# Patient Record
Sex: Female | Born: 1970
Health system: Southern US, Community
[De-identification: ages and names within clinical notes are randomized; demographics above are authoritative.]

## PROBLEM LIST (undated history)

## (undated) DIAGNOSIS — Z860100 Personal history of colon polyps, unspecified: Secondary | ICD-10-CM

## (undated) DIAGNOSIS — D279 Benign neoplasm of unspecified ovary: Secondary | ICD-10-CM

## (undated) DIAGNOSIS — E039 Hypothyroidism, unspecified: Secondary | ICD-10-CM

## (undated) DIAGNOSIS — K760 Fatty (change of) liver, not elsewhere classified: Secondary | ICD-10-CM

## (undated) DIAGNOSIS — F419 Anxiety disorder, unspecified: Secondary | ICD-10-CM

## (undated) DIAGNOSIS — E7800 Pure hypercholesterolemia, unspecified: Secondary | ICD-10-CM

## (undated) DIAGNOSIS — G4730 Sleep apnea, unspecified: Secondary | ICD-10-CM

## (undated) DIAGNOSIS — E079 Disorder of thyroid, unspecified: Secondary | ICD-10-CM

## (undated) DIAGNOSIS — F329 Major depressive disorder, single episode, unspecified: Secondary | ICD-10-CM

## (undated) DIAGNOSIS — I10 Essential (primary) hypertension: Secondary | ICD-10-CM

## (undated) DIAGNOSIS — E139 Other specified diabetes mellitus without complications: Secondary | ICD-10-CM

## (undated) DIAGNOSIS — E559 Vitamin D deficiency, unspecified: Secondary | ICD-10-CM

## (undated) DIAGNOSIS — F32A Depression, unspecified: Secondary | ICD-10-CM

## (undated) DIAGNOSIS — C4490 Unspecified malignant neoplasm of skin, unspecified: Secondary | ICD-10-CM

## (undated) HISTORY — DX: Personal history of colon polyps, unspecified: Z86.0100

## (undated) HISTORY — DX: Depression, unspecified: F32.A

## (undated) HISTORY — DX: Benign neoplasm of unspecified ovary: D27.9

## (undated) HISTORY — DX: Major depressive disorder, single episode, unspecified: F32.9

## (undated) HISTORY — DX: Unspecified malignant neoplasm of skin, unspecified: C44.90

## (undated) HISTORY — DX: Sleep apnea, unspecified: G47.30

## (undated) HISTORY — DX: Anxiety disorder, unspecified: F41.9

## (undated) HISTORY — DX: Other specified diabetes mellitus without complications: E13.9

## (undated) HISTORY — DX: Disorder of thyroid, unspecified: E07.9

## (undated) HISTORY — DX: Fatty (change of) liver, not elsewhere classified: K76.0

## (undated) HISTORY — DX: Vitamin D deficiency, unspecified: E55.9

## (undated) HISTORY — DX: Pure hypercholesterolemia, unspecified: E78.00

## (undated) HISTORY — DX: Essential (primary) hypertension: I10

## (undated) HISTORY — PX: OVARIAN CYST REMOVAL: SHX89

## (undated) HISTORY — DX: Personal history of colonic polyps: Z86.010

## (undated) HISTORY — DX: Hypothyroidism, unspecified: E03.9

---

## 1999-09-24 ENCOUNTER — Encounter: Payer: Self-pay | Admitting: Infectious Diseases

## 1999-09-24 ENCOUNTER — Ambulatory Visit (HOSPITAL_COMMUNITY): Admission: RE | Admit: 1999-09-24 | Discharge: 1999-09-24 | Payer: Self-pay | Admitting: Infectious Diseases

## 2002-08-25 ENCOUNTER — Other Ambulatory Visit: Admission: RE | Admit: 2002-08-25 | Discharge: 2002-08-25 | Payer: Self-pay | Admitting: Obstetrics and Gynecology

## 2002-11-16 ENCOUNTER — Ambulatory Visit (HOSPITAL_COMMUNITY): Admission: RE | Admit: 2002-11-16 | Discharge: 2002-11-16 | Payer: Self-pay | Admitting: *Deleted

## 2003-05-09 ENCOUNTER — Other Ambulatory Visit: Admission: RE | Admit: 2003-05-09 | Discharge: 2003-05-09 | Payer: Self-pay | Admitting: *Deleted

## 2006-01-07 ENCOUNTER — Ambulatory Visit (HOSPITAL_BASED_OUTPATIENT_CLINIC_OR_DEPARTMENT_OTHER): Admission: RE | Admit: 2006-01-07 | Discharge: 2006-01-07 | Payer: Self-pay | Admitting: Otolaryngology

## 2006-01-10 ENCOUNTER — Ambulatory Visit: Payer: Self-pay | Admitting: Internal Medicine

## 2007-02-10 ENCOUNTER — Encounter: Admission: RE | Admit: 2007-02-10 | Discharge: 2007-02-10 | Payer: Self-pay | Admitting: Occupational Medicine

## 2007-12-08 ENCOUNTER — Ambulatory Visit (HOSPITAL_COMMUNITY): Admission: RE | Admit: 2007-12-08 | Discharge: 2007-12-08 | Payer: Self-pay | Admitting: *Deleted

## 2007-12-08 ENCOUNTER — Encounter (INDEPENDENT_AMBULATORY_CARE_PROVIDER_SITE_OTHER): Payer: Self-pay | Admitting: *Deleted

## 2010-08-03 HISTORY — PX: ABDOMINAL HYSTERECTOMY: SHX81

## 2010-12-09 ENCOUNTER — Other Ambulatory Visit: Payer: Self-pay | Admitting: Family Medicine

## 2010-12-12 ENCOUNTER — Ambulatory Visit
Admission: RE | Admit: 2010-12-12 | Discharge: 2010-12-12 | Disposition: A | Discharge status: Home / Self Care | Payer: 59 | Source: Ambulatory Visit | Attending: Family Medicine | Admitting: Family Medicine

## 2010-12-12 ENCOUNTER — Ambulatory Visit
Admission: RE | Admit: 2010-12-12 | Discharge: 2010-12-12 | Disposition: A | Discharge status: Home / Self Care | Payer: No Typology Code available for payment source | Source: Ambulatory Visit | Attending: Family Medicine | Admitting: Family Medicine

## 2010-12-12 ENCOUNTER — Other Ambulatory Visit: Payer: Self-pay | Admitting: Family Medicine

## 2010-12-12 DIAGNOSIS — R1900 Intra-abdominal and pelvic swelling, mass and lump, unspecified site: Secondary | ICD-10-CM

## 2010-12-12 MED ORDER — IOHEXOL 300 MG/ML  SOLN
125.0000 mL | Freq: Once | INTRAMUSCULAR | Status: AC | PRN
  Administered 2010-12-12: 125 mL via INTRAVENOUS

## 2010-12-16 NOTE — Op Note (Signed)
NAMESHADOE, CRYAN              ACCOUNT NO.:  [id number]   MEDICAL RECORD NO.:  [id number]          PATIENT TYPE:  AMB   LOCATION:  SDC                           FACILITY:  [facility]   PHYSICIAN:  [place] B. Earlene Plater, M.D.  DATE OF BIRTH:  17-Feb-1971   DATE OF PROCEDURE:  12/08/2007  DATE OF DISCHARGE:                               OPERATIVE REPORT   PREOPERATIVE DIAGNOSIS:  Endometrial polyp.   POSTOPERATIVE DIAGNOSIS:  Endometrial polyp.   PROCEDURE:  Hysteroscopy, dilation and curettage, and polypectomy.   SURGEON:  Chester Holstein. Earlene Plater, M.D.   ASSISTANT:  None.   ANESTHESIA:  LMA general.   FINDINGS:  Endometrial polyp and proliferative endometrium, otherwise  normal-appearing cavity.   SPECIMENS:  Endometrial polyp and curettings to pathology.   BLOOD LOSS:  Minimal.   COMPLICATIONS:  None.   FLUID DEFICIT:  50 mL.   INDICATIONS:  The patient had ultrasound in the office initially to  evaluate for pain, was noted to have an ovarian cyst, which resolved on  followup; however, was noted to have focal endometrial abnormality  consistent with polyp.  Sonohystogram subsequently confirmed these  findings.  The patient is interested in conception; therefore, I  recommended evaluation of the cavity and removal of polyp if identified.  The patient was advised of the risks of surgery including infection,  bleeding, perforation, and damage to surrounding organs.   The patient was taken to the operating room and LMA general anesthesia  obtained.  Prepped and draped in standard fashion.  Bladder drained with  an in-and-out catheter.  Exam under anesthesia showed a mid-plane normal  sized uterus, no adnexal masses.   Speculum inserted, paracervical block placed.  Cervix grasped with a  single-tooth tenaculum and cervix dilated to #21 without difficulty.  The diagnostic hysteroscope was inserted after being flushed.  Good  uterine distention obtained, above findings noted.  Polyp was  removed  with Randall stone forceps.  Endometrium gently curetted.  Scope  returned, no other abnormalities seen, and the procedure was terminated.   The instruments were removed.  Cervix was hemostatic.  The patient  tolerated the procedure well with no complications.  She was taken to  recovery room awake, alert, and in stable condition.      Gerri Spore B. Earlene Plater, M.D.  Electronically Signed     WBD/MEDQ  D:  12/08/2007  T:  12/08/2007  Job:  [id number]

## 2010-12-19 NOTE — H&P (Signed)
   [name], [name]                        ACCOUNT NO.:  [id number]   MEDICAL RECORD NO.:  [id number]                   PATIENT TYPE:  AMB   LOCATION:  SDC                                  [facility]  [facility]   PHYSICIAN:  [place] B. Earlene Plater, M.D.               DATE OF BIRTH:  1971-07-06   DATE OF ADMISSION:  DATE OF DISCHARGE:                                HISTORY & PHYSICAL   PREOPERATIVE DIAGNOSIS:  Severe cervical dysplasia.   INTENDED PROCEDURE:  Laser vaporization of cervical dysplasia.   HISTORY OF PRESENT ILLNESS:  A 40 year old, white female, gravida 0, history  of low-grade Pap smears.  Subsequently biopsies showed large area of cervix  involving CIN 2 and 3.  Colposcopic impression was moderate dysplasia, and  the area is extensive, involving the cervix.  I have therefore recommended  patient undergo laser vaporization as opposed to a LEEP to try to reduce  cervical trauma but effect adequate treatment.   PAST MEDICAL HISTORY:  None.   PAST SURGICAL HISTORY:  A urethral dilation.   MEDICATIONS:  None.   ALLERGIES:  None.   SOCIAL HISTORY:  No alcohol or tobacco or drugs.   FAMILY HISTORY:  Heart disease, hypertension.   REVIEW OF SYSTEMS:  Otherwise noncontributory.   PHYSICAL EXAMINATION:  VITAL SIGNS:  Blood pressure 120/80, pulse 80, weight  189.  GENERAL:  Alert and oriented, in no acute distress.  SKIN:  Warm and dry, no lesions.  HEART:  Regular rate and rhythm.  LUNGS:  Clear to auscultation.  PELVIC:  Normal external genitalia.  Vagina on colposcopy shows large area  of acetowhite changes along the cervix circumferentially around the cervix  but more extensively involving the left lower quadrant.   ASSESSMENT:  Severe cervical dysplasia.   PLAN:  Laser vaporization with CO2.  Operative risks discussed of infection  and bleeding, risk of cervical weakening that could impact subsequent  pregnancies.  All questions answered.  Patient wished to  proceed.                                               Gerri Spore B. Earlene Plater, M.D.   WBD/MEDQ  D:  11/15/2002  T:  11/15/2002  Job:  [id number]

## 2010-12-19 NOTE — Op Note (Signed)
NAMEKWANA, RINGEL                        ACCOUNT NO.:  [id number]   MEDICAL RECORD NO.:  [id number]                   PATIENT TYPE:  AMB   LOCATION:  SDC                                  [facility]   PHYSICIAN:  [place] B. Earlene Plater, M.D.               DATE OF BIRTH:  1971/02/19   DATE OF PROCEDURE:  11/16/2002  DATE OF DISCHARGE:                                 OPERATIVE REPORT   PREOPERATIVE DIAGNOSES:  CIN II and III of ectocervix.   POSTOPERATIVE DIAGNOSES:  CIN II and III of ectocervix.   PROCEDURE:  Laser vaporization with CO2 of cervical dysplasia.   SURGEON:  Chester Holstein. Earlene Plater, M.D.   ANESTHESIA:  LMA general and 1% lidocaine with epinephrine paracervical  block.   FINDINGS:  Acetowhite changes diffusely on the ectocervix and transition  zone.   ESTIMATED BLOOD LOSS:  Less than 25 mL.   COMPLICATIONS:  None.   SPECIMENS:  None.   INDICATIONS:  The patient with biopsy proven CIN II and CIN III which was  diffusely involving the ectocervix and transition zone.  Given the extensive  nature of the lesion, recommended CO2 as opposed to a LEEP to attempt to  reduce cervical trauma.   PROCEDURE:  The patient was taken to the operating room and LMA general  anesthesia obtained.  She was placed in the ski position and draped with  moistened surgical towels.  Speculum inserted.  4% acetic acid applied to  the cervix.  Colposcopy performed.  Previously noted area of dense  acetowhite changes seen, again most dominantly in the left lower quadrant of  the cervix; however, the majority of the ectocervix and exterior portion of  transition zone were affected.  Paracervical block was placed in a standard  technique.  CO2 laser attached to the colposcope and set on 15 watts  continuous.  The area of abnormality in about a 2 mm margin was outlined  with laser and the area inside this ablated with the CO2 laser.  Depth of 3  mm was obtained.  The laser was then diffused and  placed at 10 watts and the  bleeding areas retouched for hemostasis.  Subsequently, a piece of Gelfoam  soaked on Monsel's was placed over the laser bed.  The patient tolerated procedure well.  There were no complications.   DISPOSITION:  She was taken to recovery room awake, alert, in stable  condition.  She will return to the office in a month and will plan to repeat  cytology in four to six months.                                               Gerri Spore B. Earlene Plater, M.D.   WBD/MEDQ  D:  11/16/2002  T:  11/16/2002  Job:  [id number]

## 2010-12-19 NOTE — Procedures (Signed)
NAMEBRITTON, PERKINSON              ACCOUNT NO.:  [id number]   MEDICAL RECORD NO.:  [id number]          PATIENT TYPE:  OUT   LOCATION:  SLEEP CENTER                 FACILITY:  [facility]   PHYSICIAN:  [place] D. Maple Hudson, M.D. DATE OF BIRTH:  08/21/1970   DATE OF STUDY:                              NOCTURNAL POLYSOMNOGRAM   REFERRING PHYSICIAN:  Dr. Christia Reading.   INDICATIONS FOR STUDY:  Hypersomnia with sleep apnea.   EPWORTH SLEEPINESS SCORE:  12/24.   BMI:  38.3.   WEIGHT:  225 pounds.   No medications listed.   SLEEP ARCHITECTURE:  Total sleep time 389 minutes with sleep efficiency 91%.  Stage I was 4%, stage II 54%, stages III and IV 19%, REM 23% of total sleep  time.  Sleep latency 15 minutes, REM latency 184 minutes, awake after sleep  onset 24 minutes, arousal index 14.8.  No bedtime medication taken.   RESPIRATORY DATA:  Split study protocol.  Apnea/hypopnea index (AHI, RDI)  52.9 obstructive events per hour indicating severe obstructive sleep  apnea/hypopnea syndrome before CPAP.  This included 18 obstructive apneas  and 105 hypopneas before CPAP.  Events were more common while supine and  most sleep was while supine.  REM AHI 4.6 per hour.  CPAP was titrated to 12  CWP, AHI 2.4 per hour.  A medium ResMed Mirage Swift II was used with heated  humidifier.   OXYGEN DATA:  Moderate snoring with oxygen desaturation to a nadir of 74%  before CPAP.  After sleep at control saturation held 94-95% on room air.   CARDIAC DATA:  Normal sinus rhythm.   MOVEMENT/PARASOMNIA:  Insignificant leg jerks. bathroom times one.   IMPRESSION/RECOMMENDATIONS:  1.  Severe obstructive sleep apnea/hypopnea syndrome, AHI 52.9 per hour with      events more common while supine.  Moderate snoring with oxygen      desaturation to a nadir of 74%.  2.  Successful CPAP titration to 12 CWP, AHI 2.4 per hour.  A ResMed Mirage      Swift II was used with medium nasal pillows and heated  humidifier.      Clinton D. Maple Hudson, M.D.  Diplomate, Biomedical engineer of Sleep Medicine  Electronically Signed     CDY/MEDQ  D:  01/10/2006 09:24:20  T:  01/11/2006 07:34:33  Job:  [id number]

## 2011-01-16 ENCOUNTER — Ambulatory Visit: Payer: 59 | Admitting: Gynecology

## 2011-01-20 ENCOUNTER — Ambulatory Visit: Payer: 59 | Attending: Gynecology | Admitting: Gynecology

## 2011-01-20 DIAGNOSIS — Z9079 Acquired absence of other genital organ(s): Secondary | ICD-10-CM | POA: Insufficient documentation

## 2011-01-20 DIAGNOSIS — Z90710 Acquired absence of both cervix and uterus: Secondary | ICD-10-CM | POA: Insufficient documentation

## 2011-01-20 DIAGNOSIS — Z09 Encounter for follow-up examination after completed treatment for conditions other than malignant neoplasm: Secondary | ICD-10-CM | POA: Insufficient documentation

## 2011-01-22 NOTE — Consult Note (Signed)
  NAMEVELORA, [name]              ACCOUNT NO.:  [id number]  MEDICAL RECORD NO.:  [id number]  LOCATION:  GYN                          FACILITY:  [facility]  PHYSICIAN:  De Blanch, M.D.DATE OF BIRTH:  07-24-1971  DATE OF CONSULTATION:  01/20/2011 DATE OF DISCHARGE:                                CONSULTATION   CHIEF COMPLAINT:  Postoperative followup.  INTERVAL HISTORY:  The patient returns today now 1 week following right salpingo-oophorectomy and abdominal hysterectomy.  She has had an uncomplicated postoperative course.  She is eating well and has had a recent bowel movement.  She still has some pain in her lower abdomen, which is not surprising.  She denies any fever or chills and denies any vaginal bleeding.  I have reviewed the patient's pathology report with her today.  The ovarian cyst, which is 26 cm in diameter and contained 7 liters of clear fluid was a serous cystadenoma.  The uterus had an incidental endometrial polyp with no other abnormalities.  PHYSICAL EXAMINATION:  VITAL SIGNS:  Weight 231 pounds, blood pressure 128/68, pulse 70. GENERAL:  The patient is a healthy white female in no acute distress. ABDOMEN:  Soft, nontender.  Midline incision is healing well.  No mass, organomegaly, ascites, or hernias are noted.  The skin staples were removed and Steri-Strips were applied.  The patient continued to recover and has her postoperative instructions. She will return to see [place] for a 6-week postoperative check and at that juncture release her back to her primary gynecologist, Dr. Cherly Hensen.     De Blanch, M.D.     DC/MEDQ  D:  01/20/2011  T:  01/21/2011  Job:  [id number]  cc:   Maxie Better, M.D. Fax: [phone]  Telford Nab, R.N. [address] [place], [place] [address]  Electronically Signed by De Blanch M.D. on 01/22/2011 11:23:38 AM

## 2011-02-01 DIAGNOSIS — D279 Benign neoplasm of unspecified ovary: Secondary | ICD-10-CM

## 2011-02-01 HISTORY — DX: Benign neoplasm of unspecified ovary: D27.9

## 2011-07-07 ENCOUNTER — Other Ambulatory Visit: Payer: Self-pay | Admitting: Obstetrics and Gynecology

## 2011-07-07 ENCOUNTER — Other Ambulatory Visit: Payer: Self-pay | Admitting: Family Medicine

## 2011-07-07 DIAGNOSIS — Z1231 Encounter for screening mammogram for malignant neoplasm of breast: Secondary | ICD-10-CM

## 2011-08-05 ENCOUNTER — Ambulatory Visit
Admission: RE | Admit: 2011-08-05 | Discharge: 2011-08-05 | Disposition: A | Discharge status: Home / Self Care | Payer: BC Managed Care – PPO | Source: Ambulatory Visit | Attending: Family Medicine | Admitting: Family Medicine

## 2011-08-05 DIAGNOSIS — Z1231 Encounter for screening mammogram for malignant neoplasm of breast: Secondary | ICD-10-CM

## 2011-09-27 ENCOUNTER — Ambulatory Visit (INDEPENDENT_AMBULATORY_CARE_PROVIDER_SITE_OTHER): Payer: BC Managed Care – PPO | Admitting: Family Medicine

## 2011-09-27 DIAGNOSIS — R413 Other amnesia: Secondary | ICD-10-CM

## 2011-09-27 DIAGNOSIS — R6889 Other general symptoms and signs: Secondary | ICD-10-CM

## 2011-09-27 DIAGNOSIS — L989 Disorder of the skin and subcutaneous tissue, unspecified: Secondary | ICD-10-CM

## 2011-09-27 DIAGNOSIS — D2370 Other benign neoplasm of skin of unspecified lower limb, including hip: Secondary | ICD-10-CM

## 2011-09-27 DIAGNOSIS — E039 Hypothyroidism, unspecified: Secondary | ICD-10-CM | POA: Insufficient documentation

## 2011-09-27 LAB — POCT CBC
Granulocyte percent: 60.1 %G (ref 37–80)
HCT, POC: 38.5 % (ref 37.7–47.9)
Hemoglobin: 12.5 g/dL (ref 12.2–16.2)
Lymph, poc: 2.6 (ref 0.6–3.4)
MCH, POC: 29.8 pg (ref 27–31.2)
MCHC: 32.5 g/dL (ref 31.8–35.4)
MCV: 91.9 fL (ref 80–97)
MID (cbc): 0.4 (ref 0–0.9)
MPV: 7.7 fL (ref 0–99.8)
POC Granulocyte: 4.5 (ref 2–6.9)
POC LYMPH PERCENT: 34.9 %L (ref 10–50)
POC MID %: 5 %M (ref 0–12)
Platelet Count, POC: 343 10*3/uL (ref 142–424)
RBC: 4.19 M/uL (ref 4.04–5.48)
RDW, POC: 13.4 %
WBC: 7.5 10*3/uL (ref 4.6–10.2)

## 2011-09-27 LAB — COMPREHENSIVE METABOLIC PANEL
ALT: 14 U/L (ref 0–35)
AST: 14 U/L (ref 0–37)
Albumin: 4.4 g/dL (ref 3.5–5.2)
Alkaline Phosphatase: 65 U/L (ref 39–117)
BUN: 12 mg/dL (ref 6–23)
CO2: 26 mEq/L (ref 19–32)
Calcium: 9.7 mg/dL (ref 8.4–10.5)
Chloride: 104 mEq/L (ref 96–112)
Creat: 0.65 mg/dL (ref 0.50–1.10)
Glucose, Bld: 77 mg/dL (ref 70–99)
Potassium: 4 mEq/L (ref 3.5–5.3)
Sodium: 139 mEq/L (ref 135–145)
Total Bilirubin: 0.5 mg/dL (ref 0.3–1.2)
Total Protein: 7.3 g/dL (ref 6.0–8.3)

## 2011-09-27 LAB — TSH: TSH: 5.046 u[IU]/mL — ABNORMAL HIGH (ref 0.350–4.500)

## 2011-09-27 NOTE — Progress Notes (Signed)
Patient Name: [name] Date of Birth: [birth year] Medical Record Number: [id number] Gender: female Date of Encounter: 09/27/2011  History of Present Illness:  [name] is a 41 y.o. very pleasant female patient who presents with the following:  Needs a TSH check- last done July 2012. Had a partial hyst last summer to remove a very large but benign ovarian cyst- it weighed 17 lbs! She feels so much better.  She has one ovary remaining.   However she is concerned about a loss of memory over the last several months since she had her surgery.  Has some forgetfulness at work.  Her partner has noticed this the most- she just doesn't seem to have quite her normal memory.  Has not gotten lost in familiar surroundings, feels that her mood is ok.  Sleeping well.  Fhx: a great aunt had alzheimer's in her 21s but otherwise negative.    Also noticed a mole in her left hip the last few days- it may have been there longer but she is not sure.  Does not hurt, itch or bleed Patient Active Problem List  Diagnoses  . Hypothyroidism   Past Medical History  Diagnosis Date  . Benign ovarian tumor July 2012    17 pound benign tumor- removed   Past Surgical History  Procedure Date  . Abdominal hysterectomy 2012    one ovary remains- due to giant benign ovarian cyst (17 lbs)   History  Substance Use Topics  . Smoking status: Never Smoker   . Smokeless tobacco: Never Used  . Alcohol Use: Yes     socially   Family History  Problem Relation Age of Onset  . Heart disease Father   . Heart disease Paternal Grandfather    Allergies  Allergen Reactions  . Synthroid Anxiety and Palpitations    Medication list has been reviewed and updated.  Review of Systems: As per HPI- otherwise negative  Physical Examination: Filed Vitals:   09/27/11 0916  BP: 112/76  Pulse: 61  Temp: 98.4 F (36.9 C)  TempSrc: Oral  Resp: 16  Height: 5' 4.5" (1.638 m)  Weight: 234 lb (106.142 kg)     Body mass index is 39.55 kg/(m^2).  GEN: WDWN, NAD, Non-toxic, A & O x 3 HEENT: Atraumatic, Normocephalic. Neck supple. No masses, No LAD. Ears and Nose: No external deformity. CV: RRR, No M/G/R. No JVD. No thrill. No extra heart sounds. PULM: CTA B, no wheezes, crackles, rhonchi. No retractions. No resp. distress. No accessory muscle use. ABD: S, NT, ND, +BS. No rebound. No HSM. EXTR: No c/c/e NEURO Normal gait.  PSYCH: Normally interactive. Conversant. Not depressed or anxious appearing.  Calm demeanor.  Likely seborrheic keratosis, about 5mm in diameter on her left leg   Verbal consent obtained.  Used sterile technique for procedure.  Anest with 1% lidocaine, 2ml.  Used dermablade to remove skin lesion and sent to pathology.  Dressed with gauze and a band- aid . No complications, minimal blood loss.    Assessment and Plan: 1. Hypothyroidism  TSH  2. Memory loss  POCT CBC, Comprehensive metabolic panel, TSH  3. Skin lesion of left leg  Dermatology pathology   Patient is concerned about memory loss.  She has a difficult time describing any specific instances of forgetting, but just feels that her memory is not as good as usual.  Her partner has noticed the same. I will check labs as above, asked her and her partner to independently keep a  journal regarding episodes of memory loss.  If they continue to be concerned about a pattern will send for further evaluation.    Expect pathology to show a seborrheic keratosis

## 2011-09-28 ENCOUNTER — Encounter: Payer: Self-pay | Admitting: Family Medicine

## 2011-10-01 ENCOUNTER — Telehealth: Payer: Self-pay

## 2011-10-01 MED ORDER — THYROID 15 MG PO TABS: 45.0000 mg | ORAL_TABLET | Freq: Every day | ORAL | Status: DC

## 2011-10-01 NOTE — Telephone Encounter (Signed)
.  [facility] PT WANTED DR COPLAND TO KNOW THAT THE LETTER SHE SENT HER IS FINE WITH THE 15MG  OF THE THYROID MEDICINE PLEASE CALL [phone]   [facility] IN [address]

## 2011-10-01 NOTE — Telephone Encounter (Signed)
Sent in a Rx. I sent in #90 so when she ran out of her 30mg  tablets she could take 3 of the 15mg  and only have 1 copay.

## 2011-10-02 NOTE — Telephone Encounter (Signed)
GAVE PT MESSAGE

## 2012-01-27 ENCOUNTER — Other Ambulatory Visit: Payer: Self-pay | Admitting: Physician Assistant

## 2012-03-20 ENCOUNTER — Other Ambulatory Visit: Payer: Self-pay | Admitting: Physician Assistant

## 2012-04-28 ENCOUNTER — Other Ambulatory Visit: Payer: Self-pay | Admitting: Physician Assistant

## 2012-05-17 ENCOUNTER — Ambulatory Visit (INDEPENDENT_AMBULATORY_CARE_PROVIDER_SITE_OTHER): Payer: 59 | Admitting: Family Medicine

## 2012-05-17 VITALS — BP 138/92 | HR 90 | Resp 18 | Ht 64.0 in | Wt 245.0 lb

## 2012-05-17 DIAGNOSIS — L709 Acne, unspecified: Secondary | ICD-10-CM

## 2012-05-17 DIAGNOSIS — L570 Actinic keratosis: Secondary | ICD-10-CM

## 2012-05-17 DIAGNOSIS — E039 Hypothyroidism, unspecified: Secondary | ICD-10-CM

## 2012-05-17 DIAGNOSIS — L708 Other acne: Secondary | ICD-10-CM

## 2012-05-17 MED ORDER — CLINDAMYCIN PHOS-BENZOYL PEROX 1.2-5 % EX GEL: 1.0000 "application " | Freq: Two times a day (BID) | CUTANEOUS | Status: DC

## 2012-05-17 NOTE — Progress Notes (Signed)
  Subjective:    Patient ID: Newman Pies, female    DOB: 05-Aug-1970, 41 y.o.   MRN: [id number] Chief Complaint  Patient presents with  . Hypothyroidism    thyroid meds renewed  . Other    has bump tip of nose    HPI  Fatigued no hair, skin changes, bowels, mood ok, sleep ok.     Review of Systems    BP 138/92  Pulse 90  Resp 18  Ht 5\' 4"  (1.626 m)  Wt 245 lb (111.131 kg)  BMI 42.03 kg/m2  SpO2 98%  Objective:   Physical Exam        Assessment & Plan:  Hypothyroidism - Plan: TSH  Acne - Plan: Clindamycin-Benzoyl Per, Refr, gel, Ambulatory referral to Dermatology  Actinic keratosis of multiple sites of head and neck - Plan: Clindamycin-Benzoyl Per, Refr, gel, Ambulatory referral to Dermatology  Meds ordered this encounter  Medications  . Clindamycin-Benzoyl Per, Refr, gel    Sig: Apply 1 application topically 2 (two) times daily.    Dispense:  45 g    Refill:  1

## 2012-05-17 NOTE — Patient Instructions (Addendum)
Look for a face wash with some salicylic acid in it to use once to twice a day to keep your pores clear and prevent these whiteheads.  Actinic Keratosis Actinic keratosis is a precancerous growth on the skin. This means it could develop into skin cancer if it is not treated. About 1% of actinic keratoses turn into skin cancer within a year. It is important to have all such growths removed to prevent them from developing into skin cancer. CAUSES  Actinic keratosis is caused by getting too much ultraviolet (UV) radiation from the sun or other UV light sources. RISK FACTORS Factors that increase your chances of getting actinic keratosis include:  Having light-colored skin and blue eyes.  Having blonde or red hair.  Spending a lot of time in the sun.  Age. The risk of actinic keratosis increases with age. SYMPTOMS  Actinic keratosis growths look like scaly, rough spots of skin. They can be as small as a pinhead or as big as a quarter. They may itch, hurt, or feel sensitive. Sometimes there is a little tag of pink or gray skin growing off them. In some cases, actinic keratoses are easier felt than seen. They do not go away with the use of moisturizing lotions or creams. Actinic keratoses appear most often on areas of skin that get a lot of sun exposure. These areas include the:  Scalp.  Face.  Ears.  Lips.  Upper back.  Backs of the hands.  Forearms. DIAGNOSIS  Your caregiver can usually tell what is wrong by performing a physical exam. A tissue sample (biopsy) may also be taken and examined under a microscope. TREATMENT  Actinic keratosis can be treated several ways. Most treatments can be done in your caregiver's office. Treatment options may include:  Curettage. A tool is used to gently scrape off the growth.  Cryosurgery. Liquid nitrogen is applied to the growth to freeze it. The growth eventually falls off the skin.  Medicated creams, such as 5-fluorouracil or imiquimod. The  medicine destroys the cells in the growth.  Chemical peels. Chemicals are applied to the growth and the outer layers of skin are peeled off.  Photodynamic therapy. A drug that makes your skin more sensitive to light is applied to the skin. A strong, blue light is aimed at the skin and destroys the growth. PREVENTION  To prevent future sun damage:  Try to avoid the sun between 10:00 a.m. and 4:00 p.m. when it is the strongest.  Use a sunscreen or sunblock with SPF 30 or greater.  Apply sunscreen at least 30 minutes before exposure to the sun.  Always wear protective hats, clothing, and sunglasses with UV protection.  Avoid medicines, herbs, and foods that increase your sensitivity to sunlight.  Avoid tanning beds. HOME CARE INSTRUCTIONS   If your skin was covered with a bandage, change and remove the bandage as directed by your caregiver.  Keep the treated area dry as directed by your caregiver.  Apply any creams as prescribed by your caregiver. Follow the directions carefully.  Check your skin regularly for any changes.  Visit a skin doctor (dermatologist) every year for a skin exam. SEEK MEDICAL CARE IF:   Your skin does not heal and becomes irritated, red, or bleeds.  You notice any changes or new growths on your skin. Document Released: 10/16/2008 Document Revised: 10/12/2011 Document Reviewed: 08/31/2011 [facility] Patient Information 2013 [place], [place].

## 2012-05-18 LAB — TSH: TSH: 3.94 u[IU]/mL (ref 0.350–4.500)

## 2012-05-23 ENCOUNTER — Other Ambulatory Visit: Payer: Self-pay | Admitting: Physician Assistant

## 2012-06-29 ENCOUNTER — Other Ambulatory Visit: Payer: Self-pay | Admitting: Family Medicine

## 2012-06-29 DIAGNOSIS — Z1231 Encounter for screening mammogram for malignant neoplasm of breast: Secondary | ICD-10-CM

## 2012-07-05 ENCOUNTER — Other Ambulatory Visit: Payer: Self-pay | Admitting: Surgery

## 2012-08-05 ENCOUNTER — Ambulatory Visit
Admission: RE | Admit: 2012-08-05 | Discharge: 2012-08-05 | Disposition: A | Discharge status: Home / Self Care | Payer: Self-pay | Source: Ambulatory Visit | Attending: Family Medicine | Admitting: Family Medicine

## 2012-08-05 DIAGNOSIS — Z1231 Encounter for screening mammogram for malignant neoplasm of breast: Secondary | ICD-10-CM

## 2013-01-16 ENCOUNTER — Ambulatory Visit (INDEPENDENT_AMBULATORY_CARE_PROVIDER_SITE_OTHER): Payer: 59 | Admitting: Physician Assistant

## 2013-01-16 VITALS — BP 131/88 | HR 97 | Temp 98.9°F | Resp 16 | Ht 64.5 in | Wt 249.0 lb

## 2013-01-16 DIAGNOSIS — E669 Obesity, unspecified: Secondary | ICD-10-CM

## 2013-01-16 DIAGNOSIS — E039 Hypothyroidism, unspecified: Secondary | ICD-10-CM

## 2013-01-16 DIAGNOSIS — J329 Chronic sinusitis, unspecified: Secondary | ICD-10-CM

## 2013-01-16 DIAGNOSIS — R059 Cough, unspecified: Secondary | ICD-10-CM

## 2013-01-16 DIAGNOSIS — R05 Cough: Secondary | ICD-10-CM

## 2013-01-16 LAB — GLUCOSE, POCT (MANUAL RESULT ENTRY): POC Glucose: 94 mg/dl (ref 70–99)

## 2013-01-16 LAB — TSH: TSH: 6.024 u[IU]/mL — ABNORMAL HIGH (ref 0.350–4.500)

## 2013-01-16 MED ORDER — HYDROCOD POLST-CHLORPHEN POLST 10-8 MG/5ML PO LQCR: 5.0000 mL | Freq: Two times a day (BID) | ORAL | Status: DC | PRN

## 2013-01-16 MED ORDER — IPRATROPIUM BROMIDE 0.03 % NA SOLN: 2.0000 | Freq: Two times a day (BID) | NASAL | Status: DC

## 2013-01-16 MED ORDER — AMOXICILLIN-POT CLAVULANATE 875-125 MG PO TABS: 1.0000 | ORAL_TABLET | Freq: Two times a day (BID) | ORAL | Status: DC

## 2013-01-16 NOTE — Progress Notes (Signed)
  Subjective:    Patient ID: [name], female    DOB: 1970-10-18, 42 y.o.   MRN: [id number]  HPI  Presents with "head and chest cold" x 10 days. Headache, nasal congestion, sore throat, drainage, and productive cough. Cough keeps patient awake at night and is green/yellow in color. Headache is described as pressure in a band-like pattern around entire head. Has tried Mucinex, Tussin, and Dayquil without improvement. Pt also describes a yellow crust on her eyelashes in the morning which prevent her from opening her eye. Denies nausea, vomiting, fever. Denies sinus tenderness.   Hx of hypothyroidism.  Review of Systems Denies: vision changes, SOB, chest pain, swelling in extremities, numbness/tingling in hands/feet.     Objective:   Physical Exam  BP 131/88  Pulse 97  Temp(Src) 98.9 F (37.2 C)  Resp 16  Ht 5' 4.5" (1.638 m)  Wt 249 lb (112.946 kg)  BMI 42.1 kg/m2  SpO2 97%  General: WDWN female appears stated age in no acute distress Head: normocephalic, atraumatic  Eyes: PERLA   Ears: TMs are clear with visible bony landmarks   Nose: turbinates normal, mild amounts of clear discharge visible  Throat: uvula midline, posterior pharynx is mildly erythematous, tonsils with mild erythema without exudate   Neck: supple, no lymphadenopathy or tenderness Resp: clear to auscultation bilaterally, without rales/rhochi/wheezes Cardiac: RRR, no murmurs/rubs/gallops  Extremities: moves all limbs spontaneously  Neuro: alert and oriented x 3 Skin: no rashes, lesions   Results for orders placed in visit on 01/16/13  GLUCOSE, POCT (MANUAL RESULT ENTRY)      Result Value Range   POC Glucose 94  70 - 99 mg/dl       Assessment & Plan:   Cough - Plan: chlorpheniramine-HYDROcodone (TUSSIONEX PENNKINETIC ER) 10-8 MG/5ML LQCR  Sinusitis - Plan: amoxicillin-clavulanate (AUGMENTIN) 875-125 MG per tablet, ipratropium (ATROVENT) 0.03 % nasal spray  Hypothyroidism - Plan: TSH  Obesity  - Plan: POCT glucose (manual entry)  Suspect sinus infection is causing productive cough.   TSH to monitor hypothyroidism (last measurement was 8 months ago).  Tussionex for cough.  Augmentin for sinus infection. Atrovent for nasal congestion.  May continue Mucinex OTC.

## 2013-01-16 NOTE — Progress Notes (Signed)
I have examined this patient along with the student and agree. Given that the last TSH was drawn 8 months ago, we'll update that here today.  Additionally, she requests screening for DM.  Her weight has fluctuated a lot (she's obese) and she has seen a number of direct-to-consumer education about pre-diabetes.

## 2013-01-16 NOTE — Patient Instructions (Addendum)
Take medications as directed.  Cough medication may make you drowsy, if you take it during the day please be careful at work and driving.  Finish the entire course of the antibiotic, even if your symptoms resolve sooner. Use warm compresses over eyelid and wash daily with warm water and a mild soap.  You may continue to take Mucinex.   Drink at least 64 fl oz each day to stay hydrated.  Get plenty of rest.  If your symptoms do not improve, or worse, please return to clinic.   When your lab results are ready, we will contact you.

## 2013-03-15 ENCOUNTER — Ambulatory Visit: Payer: BC Managed Care – PPO

## 2013-03-15 ENCOUNTER — Ambulatory Visit (INDEPENDENT_AMBULATORY_CARE_PROVIDER_SITE_OTHER): Payer: BC Managed Care – PPO | Admitting: Family Medicine

## 2013-03-15 VITALS — BP 124/76 | HR 77 | Temp 98.8°F | Resp 18 | Ht 64.5 in | Wt 252.0 lb

## 2013-03-15 DIAGNOSIS — R200 Anesthesia of skin: Secondary | ICD-10-CM

## 2013-03-15 DIAGNOSIS — R10816 Epigastric abdominal tenderness: Secondary | ICD-10-CM

## 2013-03-15 DIAGNOSIS — R209 Unspecified disturbances of skin sensation: Secondary | ICD-10-CM

## 2013-03-15 DIAGNOSIS — E039 Hypothyroidism, unspecified: Secondary | ICD-10-CM

## 2013-03-15 DIAGNOSIS — R202 Paresthesia of skin: Secondary | ICD-10-CM

## 2013-03-15 DIAGNOSIS — G8929 Other chronic pain: Secondary | ICD-10-CM

## 2013-03-15 LAB — POCT CBC
Granulocyte percent: 63.3 %G (ref 37–80)
HCT, POC: 41.2 % (ref 37.7–47.9)
Hemoglobin: 13 g/dL (ref 12.2–16.2)
Lymph, poc: 3 (ref 0.6–3.4)
MCH, POC: 30.7 pg (ref 27–31.2)
MCHC: 31.6 g/dL — AB (ref 31.8–35.4)
MCV: 97.4 fL — AB (ref 80–97)
MID (cbc): 0.5 (ref 0–0.9)
MPV: 7.5 fL (ref 0–99.8)
POC Granulocyte: 6.1 (ref 2–6.9)
POC LYMPH PERCENT: 31.2 %L (ref 10–50)
POC MID %: 5.5 %M (ref 0–12)
Platelet Count, POC: 375 10*3/uL (ref 142–424)
RBC: 4.23 M/uL (ref 4.04–5.48)
RDW, POC: 12.3 %
WBC: 9.6 10*3/uL (ref 4.6–10.2)

## 2013-03-15 LAB — COMPREHENSIVE METABOLIC PANEL
ALT: 17 U/L (ref 0–35)
AST: 14 U/L (ref 0–37)
Albumin: 4.3 g/dL (ref 3.5–5.2)
Alkaline Phosphatase: 69 U/L (ref 39–117)
BUN: 10 mg/dL (ref 6–23)
CO2: 25 mEq/L (ref 19–32)
Calcium: 9.6 mg/dL (ref 8.4–10.5)
Chloride: 102 mEq/L (ref 96–112)
Creat: 0.61 mg/dL (ref 0.50–1.10)
Glucose, Bld: 76 mg/dL (ref 70–99)
Potassium: 4.1 mEq/L (ref 3.5–5.3)
Sodium: 136 mEq/L (ref 135–145)
Total Bilirubin: 0.4 mg/dL (ref 0.3–1.2)
Total Protein: 7.6 g/dL (ref 6.0–8.3)

## 2013-03-15 NOTE — Patient Instructions (Addendum)
We will be in touch regarding your labs and abdominal ultrasound. If any change in your symptoms or other concerns do not hesitate to get in touch

## 2013-03-15 NOTE — Progress Notes (Addendum)
[facility] [address], [place] [address] [phone]  Date:  03/15/2013   Name:  [name]   DOB:  30-Sep-1970   MRN:  [id number]  PCP:  No primary provider on file.    Chief Complaint: numbness in hands and arms, chest pain that comes and goes and tingling in tongue   History of Present Illness:  [name] is a 42 y.o. very pleasant female patient who presents with the following:  She has noted intermittent numbness in her hands for the last couple of months. It seems to occur more when her wrists are flexed such as with holding a book or typing.  She does not note particular predilection for certain fingers.  It is not worse in the morning, and her sx are bilateral.  The sx seem to run into her forearms now.  She will shake/ flex her fingers to relieve the pressure.  No other numbness or weakness noted.   She also notes a "tingling" in her tongue for about a month.  This also seems to come and go, but no particular pattern that she had noted.  No difficulty using her tongue or speaking/ chewing.  No slurred speech.  No taste loss.   She also has noted some sharp pains in her upper abdomen/ lower chest area.  They may last for a minute or two.  She may have several pains in a day, but then none for several day.  She has not had this pain in about one week. She has not noted any connection with eating.  This sx is non- exertional.    No CP or SOB.  She does feel tired.  She is having a hard time losing weight and continues to gain.    Her thyroid medication was not adjusted after her last check.   She has had a hysterectomy so she cannot be pregnant.  She still does have one ovary.     Patient Active Problem List   Diagnosis Date Noted  . Hypothyroidism 09/27/2011    Past Medical History  Diagnosis Date  . Benign ovarian tumor July 2012    17 pound benign tumor- removed  . Anxiety     Past Surgical History  Procedure Laterality Date   . Abdominal hysterectomy  2012    one ovary remains- due to giant benign ovarian cyst (17 lbs)  . Ovarian cyst removal      History  Substance Use Topics  . Smoking status: Never Smoker   . Smokeless tobacco: Never Used  . Alcohol Use: Yes     Comment: socially    Family History  Problem Relation Age of Onset  . Heart disease Father   . Heart disease Paternal Grandfather   . COPD Mother   . Congestive Heart Failure Mother   . Gallbladder disease Maternal Grandmother     Allergies  Allergen Reactions  . Levothyroxine Sodium Anxiety and Palpitations    Medication list has been reviewed and updated.  Current Outpatient Prescriptions on File Prior to Visit  Medication Sig Dispense Refill  . thyroid (ARMOUR THYROID) 15 MG tablet Take 3 tablets (45 mg total) by mouth daily.  90 each  5   No current facility-administered medications on file prior to visit.    Review of Systems:  As per HPI- otherwise negative.   Physical Examination: Filed Vitals:   03/15/13 1515  BP: 124/76  Pulse: 77  Temp: 98.8 F (  37.1 C)  Resp: 18   Filed Vitals:   03/15/13 1515  Height: 5' 4.5" (1.638 m)  Weight: 252 lb (114.306 kg)   Body mass index is 42.6 kg/(m^2). Ideal Body Weight: Weight in (lb) to have BMI = 25: 147.6  GEN: WDWN, NAD, Non-toxic, A & O x 3, obese HEENT: Atraumatic, Normocephalic. Neck supple. No masses, No LAD.  Bilateral TM wnl, oropharynx normal.  PEERL,EOMI.   No oral lesions, tongue is normal Ears and Nose: No external deformity. CV: RRR, No M/G/R. No JVD. No thrill. No extra heart sounds. PULM: CTA B, no wheezes, crackles, rhonchi. No retractions. No resp. distress. No accessory muscle use. ABD: S, NT, ND, +BS. No rebound. No HSM. EXTR: No c/c/e NEURO Normal gait. Normal balance.  Normal strength, sensation and DTR all extremities.  Negative Phalen's test.   PSYCH: Normally interactive. Conversant. Not depressed or anxious appearing.  Calm demeanor.    [facility] reading (PRIMARY) by  Dr. Patsy Lager. Cervical spine: minimal degenerative change CERVICAL SPINE - COMPLETE 4+ VIEW  Comparison: None.  Findings: There is normal alignment. Anterior longitudinal ligament calcification at the level of the C4-5, C5-6, and C6-7 interspaces. Cervical vertebral body and intervertebral disc height well maintained throughout. Negative for fracture. Multiple dental restorations. No prevertebral soft tissue swelling.  IMPRESSION:  1. Negative for fracture or other acute bony abnormality. 2. Early degenerative anterior longitudinal ligament calcification C4-C7.  Clinically significant discrepancy from primary report, if provided: None  EKG: possible LVH, otherwise normal   Assessment and Plan: Numbness and tingling in hands - Plan: TSH, DG Cervical Spine Complete  Unspecified hypothyroidism - Plan: TSH  Abdominal pain, chronic, epigastric - Plan: POCT CBC, Comprehensive metabolic panel, EKG 12-Lead, US Abdomen Complete  Placed in bilateral non- spica wrist splints for possible CTS symptoms. If this fails may need to evaluate for a cervical origin on her sx.   Check TSH- this could potentially be the cause of all of her sx.   Abdominal pain- she is certainly at risk for gallstones.  Await labs and order abdominal ultrasound.  Chest pains- per pt she is having epigastric pain.  She has not noted actual chest pains, pain is non- exertional.  She does have LVH on her EKG, otherwise normal. No acute changes on her EKG.  If the rest of her work- up is not helpful will consider a cardiac work- up.    Signed Abbe Amsterdam, MD  8/15- gave her a call.  Left detailed message. Her TSH is high again- will need to adjust her dose of armour thyroid Results for orders placed in visit on 03/15/13  TSH      Result Value Range   TSH 7.567 (*) 0.350 - 4.500 uIU/mL  COMPREHENSIVE METABOLIC PANEL      Result Value Range   Sodium 136  135 - 145 mEq/L   Potassium  4.1  3.5 - 5.3 mEq/L   Chloride 102  96 - 112 mEq/L   CO2 25  19 - 32 mEq/L   Glucose, Bld 76  70 - 99 mg/dL   BUN 10  6 - 23 mg/dL   Creat 1.61  0.96 - 0.45 mg/dL   Total Bilirubin 0.4  0.3 - 1.2 mg/dL   Alkaline Phosphatase 69  39 - 117 U/L   AST 14  0 - 37 U/L   ALT 17  0 - 35 U/L   Total Protein 7.6  6.0 - 8.3 g/dL   Albumin 4.3  3.5 - 5.2 g/dL   Calcium 9.6  8.4 - 16.1 mg/dL  POCT CBC      Result Value Range   WBC 9.6  4.6 - 10.2 K/uL   Lymph, poc 3.0  0.6 - 3.4   POC LYMPH PERCENT 31.2  10 - 50 %L   MID (cbc) 0.5  0 - 0.9   POC MID % 5.5  0 - 12 %M   POC Granulocyte 6.1  2 - 6.9   Granulocyte percent 63.3  37 - 80 %G   RBC 4.23  4.04 - 5.48 M/uL   Hemoglobin 13.0  12.2 - 16.2 g/dL   HCT, POC 09.6  04.5 - 47.9 %   MCV 97.4 (*) 80 - 97 fL   MCH, POC 30.7  27 - 31.2 pg   MCHC 31.6 (*) 31.8 - 35.4 g/dL   RDW, POC 40.9     Platelet Count, POC 375  142 - 424 K/uL   MPV 7.5  0 - 99.8 fL   Call in armour thyroid 30 mg, take 2 a day for a total of 60 mg (she had been on 45).  Plan to recheck TSH in about 2 months.  OI will look out for her abdominal ultrasound report and call her.  Otherwise labs look ok

## 2013-03-16 LAB — TSH: TSH: 7.567 u[IU]/mL — ABNORMAL HIGH (ref 0.350–4.500)

## 2013-03-17 MED ORDER — THYROID 30 MG PO TABS: 60.0000 mg | ORAL_TABLET | Freq: Every day | ORAL | Status: DC

## 2013-03-17 NOTE — Addendum Note (Signed)
Addended by: Abbe Amsterdam C on: 03/17/2013 03:44 PM   Modules accepted: Orders, Medications

## 2013-03-30 ENCOUNTER — Ambulatory Visit
Admission: RE | Admit: 2013-03-30 | Discharge: 2013-03-30 | Disposition: A | Discharge status: Home / Self Care | Payer: BC Managed Care – PPO | Source: Ambulatory Visit | Attending: Family Medicine | Admitting: Family Medicine

## 2013-03-30 DIAGNOSIS — G8929 Other chronic pain: Secondary | ICD-10-CM

## 2013-04-05 ENCOUNTER — Encounter: Payer: Self-pay | Admitting: Family Medicine

## 2013-04-05 ENCOUNTER — Telehealth: Payer: Self-pay | Admitting: Family Medicine

## 2013-04-05 DIAGNOSIS — R93429 Abnormal radiologic findings on diagnostic imaging of unspecified kidney: Secondary | ICD-10-CM

## 2013-04-05 NOTE — Telephone Encounter (Signed)
Spoke with Bassett today.  She is feeling a lot better, the wrist braces have helped a lot with her hand tingling sx.  Went over her recent abdominal ultrasound report.  No evidence of gallbladder disease,  However we could still consider a HIDA scan if her sx persist. Her sx are currently much better, so she would like to continue to observe.  Will schedule a follow-up ultrasound to follow- up the finding on her kidney in 6 months.

## 2013-05-24 ENCOUNTER — Encounter: Payer: Self-pay | Admitting: Family Medicine

## 2013-05-24 ENCOUNTER — Ambulatory Visit (INDEPENDENT_AMBULATORY_CARE_PROVIDER_SITE_OTHER): Payer: BC Managed Care – PPO | Admitting: Family Medicine

## 2013-05-24 VITALS — BP 120/76 | HR 83 | Temp 98.7°F | Resp 18 | Ht 64.0 in | Wt 253.0 lb

## 2013-05-24 DIAGNOSIS — E039 Hypothyroidism, unspecified: Secondary | ICD-10-CM

## 2013-05-24 LAB — TSH: TSH: 7.193 u[IU]/mL — ABNORMAL HIGH (ref 0.350–4.500)

## 2013-05-24 LAB — LDL CHOLESTEROL, DIRECT: Direct LDL: 124 mg/dL — ABNORMAL HIGH

## 2013-05-24 MED ORDER — THYROID 30 MG PO TABS: ORAL_TABLET | ORAL | Status: DC

## 2013-05-24 NOTE — Addendum Note (Signed)
Addended by: Abbe Amsterdam C on: 05/24/2013 07:17 PM   Modules accepted: Orders

## 2013-05-24 NOTE — Patient Instructions (Signed)
I will be in touch with your lab results. We will adjust your thyroid medication if needed.  Take care!

## 2013-05-24 NOTE — Progress Notes (Addendum)
[facility] [address], [place] [address] [phone]  Date:  05/24/2013   Name:  [name]   DOB:  08-11-1970   MRN:  [id number]  PCP:  No primary provider on file.    Chief Complaint: Follow-up   History of Present Illness:  [name] is a 42 y.o. very pleasant female patient who presents with the following:  Here today for a follow-up visit.  She is doing well with her thyroid medication.  She is taking 60 mg of armour right now.   She does feel improved with this medication  Wt Readings from Last 3 Encounters:  05/24/13 253 lb (114.76 kg)  03/15/13 252 lb (114.306 kg)  01/16/13 249 lb (112.946 kg)   Tingling in her hands is better with wrist splint.  She is using these at night and notes improvement.    We did an abdominal ultrasound over the summer for epigastric pain.  This is now much better.  Resutl:  1. No acute process or explanation for upper abdominal pain.  2. Mild hepatic steatosis.  3. Favor artifactual or normal variant apparent soft tissue  fullness in the upper pole left kidney. Consider ultrasound follow-  up at 6 months with special attention to this area. A more  aggressive approach would include further characterization with pre  and post contrast abdominal MRI.  She has a repeat ultrasound scheduled for a few months from now.  She notes that her occasional epigastric pain is much less frequent now.  Overall she feels that she is doing well  Patient Active Problem List   Diagnosis Date Noted  . Hypothyroidism 09/27/2011    Past Medical History  Diagnosis Date  . Benign ovarian tumor July 2012    17 pound benign tumor- removed  . Anxiety     Past Surgical History  Procedure Laterality Date  . Abdominal hysterectomy  2012    one ovary remains- due to giant benign ovarian cyst (17 lbs)  . Ovarian cyst removal      History  Substance Use Topics  . Smoking status: Never Smoker   . Smokeless  tobacco: Never Used  . Alcohol Use: Yes     Comment: socially    Family History  Problem Relation Age of Onset  . Heart disease Father   . Stroke Father   . Heart attack Father   . Heart disease Paternal Grandfather   . Heart attack Paternal Grandfather   . COPD Mother   . Congestive Heart Failure Mother   . Gallbladder disease Maternal Grandmother   . Stroke Paternal Grandmother     Allergies  Allergen Reactions  . Levothyroxine Sodium Anxiety and Palpitations    Medication list has been reviewed and updated.  Current Outpatient Prescriptions on File Prior to Visit  Medication Sig Dispense Refill  . thyroid (ARMOUR THYROID) 30 MG tablet Take 2 tablets (60 mg total) by mouth daily.  60 tablet  3   No current facility-administered medications on file prior to visit.    Review of Systems:  As per HPI- otherwise negative.   Physical Examination: Filed Vitals:   05/24/13 0942  BP: 120/76  Pulse: 83  Temp: 98.7 F (37.1 C)  Resp: 18   Filed Vitals:   05/24/13 0942  Height: 5\' 4"  (1.626 m)  Weight: 253 lb (114.76 kg)   Body mass index is 43.41 kg/(m^2). Ideal Body Weight: Weight in (lb) to have BMI =  25: 145.3  GEN: WDWN, NAD, Non-toxic, A & O x 3, obese, looks well HEENT: Atraumatic, Normocephalic. Neck supple. No masses, No LAD. Ears and Nose: No external deformity. CV: RRR, No M/G/R. No JVD. No thrill. No extra heart sounds. PULM: CTA B, no wheezes, crackles, rhonchi. No retractions. No resp. distress. No accessory muscle use. ABD: S, NT, ND EXTR: No c/c/e NEURO Normal gait.  PSYCH: Normally interactive. Conversant. Not depressed or anxious appearing.  Calm demeanor.    Assessment and Plan: Unspecified hypothyroidism - Plan: TSH, LDL cholesterol, direct  Check labs as above. Follow-up ultrasound is pending Flu shot done already, CMP and CBC done in August of this year   Signed Abbe Amsterdam, MD  Received her labs and gave her a call.  LMOM-  detailed Results for orders placed in visit on 05/24/13  TSH      Result Value Range   TSH 7.193 (*) 0.350 - 4.500 uIU/mL  LDL CHOLESTEROL, DIRECT      Result Value Range   Direct LDL 124 (*)   will increase to 75 mg of armour a day, recheck TSH in 3 weeks.

## 2013-06-26 ENCOUNTER — Telehealth: Payer: Self-pay

## 2013-06-26 NOTE — Telephone Encounter (Signed)
Patient is calling us to inquire when her last complete pe was let her know that her chart was in storage and that we would have to call her back

## 2013-06-26 NOTE — Telephone Encounter (Signed)
Spoke with patient. She had an adoption PE Aug 2011 and a CPE in March 2011.

## 2013-07-03 ENCOUNTER — Other Ambulatory Visit: Payer: Self-pay

## 2013-07-03 DIAGNOSIS — Z1231 Encounter for screening mammogram for malignant neoplasm of breast: Secondary | ICD-10-CM

## 2013-07-07 ENCOUNTER — Ambulatory Visit (INDEPENDENT_AMBULATORY_CARE_PROVIDER_SITE_OTHER): Payer: BC Managed Care – PPO | Admitting: Family Medicine

## 2013-07-07 VITALS — BP 130/80 | HR 86 | Temp 98.5°F | Resp 17 | Ht 64.5 in | Wt 253.0 lb

## 2013-07-07 DIAGNOSIS — E669 Obesity, unspecified: Secondary | ICD-10-CM

## 2013-07-07 DIAGNOSIS — E039 Hypothyroidism, unspecified: Secondary | ICD-10-CM

## 2013-07-07 DIAGNOSIS — R059 Cough, unspecified: Secondary | ICD-10-CM

## 2013-07-07 DIAGNOSIS — Z0000 Encounter for general adult medical examination without abnormal findings: Secondary | ICD-10-CM

## 2013-07-07 DIAGNOSIS — R05 Cough: Secondary | ICD-10-CM

## 2013-07-07 LAB — LIPID PANEL
Cholesterol: 194 mg/dL (ref 0–200)
HDL: 40 mg/dL (ref >39)
LDL Cholesterol: 123 mg/dL — ABNORMAL HIGH (ref 0–99)
Total CHOL/HDL Ratio: 4.9 Ratio
Triglycerides: 154 mg/dL — ABNORMAL HIGH (ref <150)
VLDL: 31 mg/dL (ref 0–40)

## 2013-07-07 LAB — TSH: TSH: 2.941 u[IU]/mL (ref 0.350–4.500)

## 2013-07-07 MED ORDER — CEFDINIR 300 MG PO CAPS: 300.0000 mg | ORAL_CAPSULE | Freq: Two times a day (BID) | ORAL | Status: DC

## 2013-07-07 NOTE — Patient Instructions (Signed)
Great to see you today. I will be in touch with your labs. Ask your GYN about your most recent tetanus shot and if it was a Td or TDaP.  If you have not yet had a TDaP we can do this for you at your next visit.    If you notice any chest pain with your exercise program please let me know  Hold onto the omnicef rx and start it if you are not better soon.

## 2013-07-07 NOTE — Progress Notes (Addendum)
[facility] [address], [place] [address] [phone]  Date:  07/07/2013   Name:  [name]   DOB:  [birth year]   MRN:  [id number]  PCP:  No primary provider on file.    Chief Complaint: Annual Exam   History of Present Illness:  [name] is a 42 y.o. very pleasant female patient who presents with the following:  She is here today for a CPE.  She also notes "a nasty cold for 2 weeks that will not go away." she has noted sinus congestion and cough.  She does not feel awful and has not noted a fever but her sx are lingering.  She is coughing up some mucus sometimes.  No earache.    She is fasting today.  Also we will recheck her TSH today as we increased her armour slightly in October.    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  Wants to be sure she is healthy enough to begin an exercise program.  She would like to lose weight. She has not noted any CP or SOB when she is active.   There is heart disease on her father's side of the family.  Onset would tend to be late 40s/ early 46s.  Her father was in his late 40s/ early 40s when he had his first MI.  He eventually had CABG.   Her GYN continues to do pap smears on her vaginal cuff.  She does not have a cervix but had "severe dysplasia" in the past so her GYN continues paps for her.  Her OBG also does her breast exams She had a mammogram in January of this year- it looked ok.  She will repeat in 2015.   It appears that her last tetanus was in 2008 but we do not have proof of this Flu shot done in August  Patient Active Problem List   Diagnosis Date Noted  . Hypothyroidism 09/27/2011    Past Medical History  Diagnosis Date  . Benign ovarian tumor July 2012    17 pound benign tumor- removed  . Anxiety     Past Surgical History  Procedure Laterality Date  . Abdominal hysterectomy  2012    one ovary remains- due to giant benign ovarian cyst (17 lbs)  . Ovarian cyst  removal      History  Substance Use Topics  . Smoking status: Never Smoker   . Smokeless tobacco: Never Used  . Alcohol Use: Yes     Comment: socially    Family History  Problem Relation Age of Onset  . Heart disease Father   . Stroke Father   . Heart attack Father   . Heart disease Paternal Grandfather   . Heart attack Paternal Grandfather   . COPD Mother   . Congestive Heart Failure Mother   . Gallbladder disease Maternal Grandmother   . Stroke Paternal Grandmother     Allergies  Allergen Reactions  . Levothyroxine Sodium Anxiety and Palpitations    Medication list has been reviewed and updated.  Current Outpatient Prescriptions on File Prior to Visit  Medication Sig Dispense Refill  . thyroid (ARMOUR) 30 MG tablet Take 2.5 tablets daily for a total of 75 mg.  90 tablet  3   No current facility-administered medications on file prior to visit.    Review of Systems:  As per HPI- otherwise negative.   Physical Examination: Filed Vitals:   07/07/13  1039  BP: 130/80  Pulse: 86  Temp: 98.5 F (36.9 C)  Resp: 17   Filed Vitals:   07/07/13 1039  Height: 5' 4.5" (1.638 m)  Weight: 253 lb (114.76 kg)   Body mass index is 42.77 kg/(m^2). Ideal Body Weight: Weight in (lb) to have BMI = 25: 147.6  GEN: WDWN, NAD, Non-toxic, A & O x 3, obese, looks well HEENT: Atraumatic, Normocephalic. Neck supple. No masses, No LAD.  Bilateral TM wnl, oropharynx normal.  PEERL,EOMI.   Ears and Nose: No external deformity. CV: RRR, No M/G/R. No JVD. No thrill. No extra heart sounds. PULM: CTA B, no wheezes, crackles, rhonchi. No retractions. No resp. distress. No accessory muscle use. ABD: S, NT, ND, +BS. No rebound. No HSM. EXTR: No c/c/e NEURO Normal gait.  PSYCH: Normally interactive. Conversant. Not depressed or anxious appearing.  Calm demeanor.   EKG:  NSR.  No ST elevation or depression.  No evidence of LVH.    Assessment and Plan: Physical exam  Unspecified  hypothyroidism - Plan: TSH  Obesity, unspecified - Plan: Lipid panel, EKG 12-Lead  Cough - Plan: cefdinir (OMNICEF) 300 MG capsule  Await labs and will follow-up with her.  She may begin her exercise program.  Discussed having her undergo a treadmill test due to family history.  However she is also aware of the risk of a false positive treadmill that may lead to further testing.  At this time she declines but will let me know if she develops any CP with her exercise program  Persistent cough and URI sx.  Gave omnicef rx to hold.  However she does seem to be getting better slowly; if possible she will not use this medication.   See patient instructions for more details.    Signed Abbe Amsterdam, MD  12/6:  Called regarding labs and [name].  Thyroid is in range.  Will send her a copy in the mail.

## 2013-07-08 ENCOUNTER — Encounter: Payer: Self-pay | Admitting: Family Medicine

## 2013-08-08 ENCOUNTER — Ambulatory Visit
Admission: RE | Admit: 2013-08-08 | Discharge: 2013-08-08 | Disposition: A | Discharge status: Home / Self Care | Payer: BC Managed Care – PPO | Source: Ambulatory Visit

## 2013-08-08 DIAGNOSIS — Z1231 Encounter for screening mammogram for malignant neoplasm of breast: Secondary | ICD-10-CM

## 2013-08-14 ENCOUNTER — Ambulatory Visit (INDEPENDENT_AMBULATORY_CARE_PROVIDER_SITE_OTHER): Payer: BC Managed Care – PPO | Admitting: Family Medicine

## 2013-08-14 VITALS — BP 130/80 | HR 80 | Temp 98.8°F | Resp 16 | Ht 65.0 in | Wt 260.0 lb

## 2013-08-14 DIAGNOSIS — R21 Rash and other nonspecific skin eruption: Secondary | ICD-10-CM

## 2013-08-14 DIAGNOSIS — B351 Tinea unguium: Secondary | ICD-10-CM

## 2013-08-14 LAB — POCT CBC
Granulocyte percent: 59 %G (ref 37–80)
HCT, POC: 42.3 % (ref 37.7–47.9)
Hemoglobin: 13.1 g/dL (ref 12.2–16.2)
Lymph, poc: 3.1 (ref 0.6–3.4)
MCH, POC: 30.3 pg (ref 27–31.2)
MCHC: 31 g/dL — AB (ref 31.8–35.4)
MCV: 97.7 fL — AB (ref 80–97)
MID (cbc): 0.5 (ref 0–0.9)
MPV: 7.3 fL (ref 0–99.8)
POC Granulocyte: 5.3 (ref 2–6.9)
POC LYMPH PERCENT: 34.9 %L (ref 10–50)
POC MID %: 6.1 %M (ref 0–12)
Platelet Count, POC: 351 10*3/uL (ref 142–424)
RBC: 4.33 M/uL (ref 4.04–5.48)
RDW, POC: 13.6 %
WBC: 9 10*3/uL (ref 4.6–10.2)

## 2013-08-14 MED ORDER — CEPHALEXIN 500 MG PO CAPS: 500.0000 mg | ORAL_CAPSULE | Freq: Two times a day (BID) | ORAL | Status: DC

## 2013-08-14 MED ORDER — TERBINAFINE HCL 250 MG PO TABS: 250.0000 mg | ORAL_TABLET | Freq: Every day | ORAL | Status: DC

## 2013-08-14 NOTE — Progress Notes (Addendum)
[facility] [address], [place] [address] [phone]  Date:  08/14/2013   Name:  [name]   DOB:  1971-01-30   MRN:  [id number]  PCP:  No primary provider on file.    Chief Complaint: Rash   History of Present Illness:  [name] is a 43 y.o. very pleasant female patient who presents with the following:  History of hypothyroidism. Here today with a rash on her legs which she has noted for about 3 days. She first noted that her foot felt hot, but then she noted an itchy rash on her right shin.  She now has some of her left as well.   The rash does not hurt- it just itches.   Overall she feels well- no malaise, etc.   She tried some cortisone cream but it did not help much.    She also has noticed thichkening of her bilateral little toenails over the last few months. She wonders if she can do anything to correct this before the warm weather arrives  Patient Active Problem List   Diagnosis Date Noted  . Obesity, unspecified 07/07/2013  . Hypothyroidism 09/27/2011    Past Medical History  Diagnosis Date  . Benign ovarian tumor July 2012    17 pound benign tumor- removed  . Anxiety     Past Surgical History  Procedure Laterality Date  . Abdominal hysterectomy  2012    one ovary remains- due to giant benign ovarian cyst (17 lbs)  . Ovarian cyst removal      History  Substance Use Topics  . Smoking status: Never Smoker   . Smokeless tobacco: Never Used  . Alcohol Use: Yes     Comment: socially    Family History  Problem Relation Age of Onset  . Heart disease Father   . Stroke Father   . Heart attack Father   . Heart disease Paternal Grandfather   . Heart attack Paternal Grandfather   . COPD Mother   . Congestive Heart Failure Mother   . Gallbladder disease Maternal Grandmother   . Stroke Paternal Grandmother     Allergies  Allergen Reactions  . Levothyroxine Sodium Anxiety and Palpitations    Medication list  has been reviewed and updated.  Current Outpatient Prescriptions on File Prior to Visit  Medication Sig Dispense Refill  . thyroid (ARMOUR) 30 MG tablet Take 2.5 tablets daily for a total of 75 mg.  90 tablet  3  . cefdinir (OMNICEF) 300 MG capsule Take 1 capsule (300 mg total) by mouth 2 (two) times daily.  20 capsule  0   No current facility-administered medications on file prior to visit.    Review of Systems:  As per HPI- otherwise negative.   Physical Examination: Filed Vitals:   08/14/13 1715  BP: 130/80  Pulse: 80  Temp: 98.8 F (37.1 C)  Resp: 16   Filed Vitals:   08/14/13 1715  Height: 5\' 5"  (1.651 m)  Weight: 260 lb (117.935 kg)   Body mass index is 43.27 kg/(m^2). Ideal Body Weight: Weight in (lb) to have BMI = 25: 149.9  GEN: WDWN, NAD, Non-toxic, A & O x 3, obese, looks well HEENT: Atraumatic, Normocephalic. Neck supple. No masses, No LAD. Ears and Nose: No external deformity. CV: RRR, No M/G/R. No JVD. No thrill. No extra heart sounds. PULM: CTA B, no wheezes, crackles, rhonchi. No retractions. No resp. distress. No accessory muscle use. EXTR: No  c/c/e NEURO Normal gait.  PSYCH: Normally interactive. Conversant. Not depressed or anxious appearing.  Calm demeanor.  She has a patch of skin rash on her right shin- the central part appears older, more red, does not blanch.  Peripheral area is lighter in color and appears more consistent with an allergic dermatitis or rhus dermatitis.  The left shin has a much smaller but similar area.   No other rash noted on her skin.  Palms and soles are negative She has thickening and discoloration of both small toenails consistent with onychomycosis.   Results for orders placed in visit on 08/14/13  POCT CBC      Result Value Range   WBC 9.0  4.6 - 10.2 K/uL   Lymph, poc 3.1  0.6 - 3.4   POC LYMPH PERCENT 34.9  10 - 50 %L   MID (cbc) 0.5  0 - 0.9   POC MID % 6.1  0 - 12 %M   POC Granulocyte 5.3  2 - 6.9   Granulocyte  percent 59.0  37 - 80 %G   RBC 4.33  4.04 - 5.48 M/uL   Hemoglobin 13.1  12.2 - 16.2 g/dL   HCT, POC 42.3  37.7 - 47.9 %   MCV 97.7 (*) 80 - 97 fL   MCH, POC 30.3  27 - 31.2 pg   MCHC 31.0 (*) 31.8 - 35.4 g/dL   RDW, POC 13.6     Platelet Count, POC 351  142 - 424 K/uL   MPV 7.3  0 - 99.8 fL    Assessment and Plan: Rash and nonspecific skin eruption - Plan: POCT CBC, cephALEXin (KEFLEX) 500 MG capsule  Onychomycosis - Plan: Comprehensive metabolic panel, terbinafine (LAMISIL) 250 MG tablet  Will try treatment with keflex for her rash.  The origin of this rash is unclear; it looks like it may have started with folliculitis on the front of her shins.  If keflex is not helpful may try a course of prednisone.   She would like to try a course of lamisil assuming her LFTs and renal function look ok See patient instructions for more details.     Signed Lamar Blinks, MD  1/13: called to go over her labs.  LMOM.  Labs are fine, may start lamisil if she likes.  Let me know if rash not better soon!

## 2013-08-14 NOTE — Patient Instructions (Signed)
Use the keflex as needed for your leg rash.  Please keep me posted as to your condition.  If in a few days we are not seeing improvement we can add prednisone.    Wait to fill the lamisil until I get in touch with you regarding your labs.

## 2013-08-15 LAB — COMPREHENSIVE METABOLIC PANEL
ALT: 19 U/L (ref 0–35)
AST: 22 U/L (ref 0–37)
Albumin: 4.2 g/dL (ref 3.5–5.2)
Alkaline Phosphatase: 58 U/L (ref 39–117)
BUN: 18 mg/dL (ref 6–23)
CO2: 25 mEq/L (ref 19–32)
Calcium: 9.5 mg/dL (ref 8.4–10.5)
Chloride: 103 mEq/L (ref 96–112)
Creat: 0.77 mg/dL (ref 0.50–1.10)
Glucose, Bld: 85 mg/dL (ref 70–99)
Potassium: 3.8 mEq/L (ref 3.5–5.3)
Sodium: 137 mEq/L (ref 135–145)
Total Bilirubin: 0.3 mg/dL (ref 0.3–1.2)
Total Protein: 7.5 g/dL (ref 6.0–8.3)

## 2013-09-15 ENCOUNTER — Encounter: Payer: Self-pay | Admitting: Family Medicine

## 2013-09-18 ENCOUNTER — Other Ambulatory Visit: Payer: BC Managed Care – PPO

## 2013-09-19 ENCOUNTER — Other Ambulatory Visit: Payer: BC Managed Care – PPO

## 2013-09-28 ENCOUNTER — Other Ambulatory Visit: Payer: BC Managed Care – PPO

## 2013-10-05 ENCOUNTER — Ambulatory Visit
Admission: RE | Admit: 2013-10-05 | Discharge: 2013-10-05 | Disposition: A | Discharge status: Home / Self Care | Payer: BC Managed Care – PPO | Source: Ambulatory Visit | Attending: Family Medicine | Admitting: Family Medicine

## 2013-10-05 DIAGNOSIS — R93429 Abnormal radiologic findings on diagnostic imaging of unspecified kidney: Secondary | ICD-10-CM

## 2013-10-16 ENCOUNTER — Encounter: Payer: Self-pay | Admitting: Family Medicine

## 2013-10-16 ENCOUNTER — Telehealth: Payer: Self-pay

## 2013-10-16 NOTE — Telephone Encounter (Signed)
Spoke to patient, she would like her imaging results.  Please advise.

## 2013-10-16 NOTE — Telephone Encounter (Signed)
Called her and LMOM.  I am sorry for the delay- I had sent her results on mychart but perhaps there was a problem. In any case, her ultrasound looked good, no renal issues noted. Good news!

## 2013-12-11 ENCOUNTER — Encounter: Payer: Self-pay | Admitting: Family Medicine

## 2014-02-09 ENCOUNTER — Other Ambulatory Visit: Payer: Self-pay | Admitting: Physician Assistant

## 2014-02-22 ENCOUNTER — Other Ambulatory Visit: Payer: Self-pay | Admitting: Family Medicine

## 2014-02-26 ENCOUNTER — Other Ambulatory Visit: Payer: Self-pay | Admitting: Family Medicine

## 2014-02-27 ENCOUNTER — Encounter: Payer: Self-pay | Admitting: Family Medicine

## 2014-02-27 DIAGNOSIS — E785 Hyperlipidemia, unspecified: Secondary | ICD-10-CM

## 2014-02-27 DIAGNOSIS — E039 Hypothyroidism, unspecified: Secondary | ICD-10-CM

## 2014-02-28 MED ORDER — THYROID 30 MG PO TABS: ORAL_TABLET | ORAL | Status: DC

## 2014-03-07 NOTE — Addendum Note (Signed)
Addended by: Lamar Blinks C on: 03/07/2014 04:53 PM   Modules accepted: Orders

## 2014-03-09 ENCOUNTER — Other Ambulatory Visit: Payer: Self-pay | Admitting: Family Medicine

## 2014-03-09 ENCOUNTER — Other Ambulatory Visit (INDEPENDENT_AMBULATORY_CARE_PROVIDER_SITE_OTHER): Payer: BC Managed Care – PPO | Admitting: Family Medicine

## 2014-03-09 DIAGNOSIS — E039 Hypothyroidism, unspecified: Secondary | ICD-10-CM

## 2014-03-09 LAB — TSH: TSH: 5.663 u[IU]/mL — ABNORMAL HIGH (ref 0.350–4.500)

## 2014-05-03 ENCOUNTER — Other Ambulatory Visit (INDEPENDENT_AMBULATORY_CARE_PROVIDER_SITE_OTHER): Payer: BC Managed Care – PPO

## 2014-05-03 DIAGNOSIS — E039 Hypothyroidism, unspecified: Secondary | ICD-10-CM

## 2014-05-03 DIAGNOSIS — E785 Hyperlipidemia, unspecified: Secondary | ICD-10-CM

## 2014-05-03 DIAGNOSIS — E038 Other specified hypothyroidism: Secondary | ICD-10-CM

## 2014-05-03 LAB — LIPID PANEL
Cholesterol: 171 mg/dL (ref 0–200)
HDL: 33 mg/dL — ABNORMAL LOW (ref >39)
LDL Cholesterol: 114 mg/dL — ABNORMAL HIGH (ref 0–99)
Total CHOL/HDL Ratio: 5.2 Ratio
Triglycerides: 119 mg/dL (ref <150)
VLDL: 24 mg/dL (ref 0–40)

## 2014-05-03 LAB — TSH: TSH: 2.964 u[IU]/mL (ref 0.350–4.500)

## 2014-05-03 NOTE — Progress Notes (Signed)
Patient here for blood draw only.

## 2014-05-09 ENCOUNTER — Encounter: Payer: Self-pay | Admitting: Family Medicine

## 2014-06-29 ENCOUNTER — Other Ambulatory Visit: Payer: Self-pay | Admitting: Family Medicine

## 2014-06-29 NOTE — Telephone Encounter (Signed)
Dr Lorelei Pont, I see that you had pt come for labs in Oct and advised her to RTC in 4-6 mos. Is it OK to fill this until then?

## 2014-07-03 ENCOUNTER — Other Ambulatory Visit: Payer: Self-pay

## 2014-07-03 DIAGNOSIS — Z1231 Encounter for screening mammogram for malignant neoplasm of breast: Secondary | ICD-10-CM

## 2014-07-24 ENCOUNTER — Encounter: Payer: Self-pay | Admitting: Family Medicine

## 2014-07-24 ENCOUNTER — Ambulatory Visit (INDEPENDENT_AMBULATORY_CARE_PROVIDER_SITE_OTHER): Payer: BC Managed Care – PPO | Admitting: Family Medicine

## 2014-07-24 VITALS — BP 143/92 | HR 72 | Temp 98.3°F | Resp 16 | Ht 64.5 in | Wt 256.8 lb

## 2014-07-24 DIAGNOSIS — Z0000 Encounter for general adult medical examination without abnormal findings: Secondary | ICD-10-CM

## 2014-07-24 DIAGNOSIS — Z23 Encounter for immunization: Secondary | ICD-10-CM

## 2014-07-24 DIAGNOSIS — E039 Hypothyroidism, unspecified: Secondary | ICD-10-CM

## 2014-07-24 DIAGNOSIS — B351 Tinea unguium: Secondary | ICD-10-CM

## 2014-07-24 DIAGNOSIS — E669 Obesity, unspecified: Secondary | ICD-10-CM

## 2014-07-24 LAB — POCT URINALYSIS DIPSTICK
Bilirubin, UA: NEGATIVE
Blood, UA: NEGATIVE
Glucose, UA: NEGATIVE
Ketones, UA: NEGATIVE
Leukocytes, UA: NEGATIVE
Nitrite, UA: NEGATIVE
Protein, UA: NEGATIVE
Spec Grav, UA: 1.02
Urobilinogen, UA: 0.2
pH, UA: 6.5

## 2014-07-24 NOTE — Progress Notes (Signed)
Subjective:    Patient ID: [name], female    DOB: 1970/09/21, 43 y.o.   MRN: [id number]  HPI Patient presents today for CPE. Last CPE- 2014 Mammo- 2015 PAP-2015 Exercise- 3x/week with trainer Flu- today  She was seen by Dr. Lorelei Pont and was prescribed Lamisil for onychomycosis of her bilateral 5th toes. She did not take due to concern for side effects. Nails have not gotten better or worse.   Review of Systems  Constitutional: Negative.   HENT: Negative.   Eyes: Negative.   Respiratory: Negative.   Cardiovascular: Negative.   Gastrointestinal: Negative.   Endocrine: Negative.   Genitourinary: Negative.   Musculoskeletal: Negative.   Skin: Negative.   Allergic/Immunologic: Negative.   Neurological: Negative.   Hematological: Negative.   Psychiatric/Behavioral: Negative.    Past Medical History  Diagnosis Date  . Benign ovarian tumor July 2012    17 pound benign tumor- removed  . Anxiety   . Thyroid disease    Past Surgical History  Procedure Laterality Date  . Abdominal hysterectomy  2012    one ovary remains- due to giant benign ovarian cyst (17 lbs)  . Ovarian cyst removal     Family History  Problem Relation Age of Onset  . Heart disease Father   . Stroke Father   . Heart attack Father   . Heart disease Paternal Grandfather   . Heart attack Paternal Grandfather   . COPD Mother   . Congestive Heart Failure Mother   . Gallbladder disease Maternal Grandmother   . Stroke Paternal Grandmother    History  Substance Use Topics  . Smoking status: Never Smoker   . Smokeless tobacco: Never Used  . Alcohol Use: Yes     Comment: socially      Objective:   Physical Exam  Constitutional: She is oriented to person, place, and time. She appears well-developed and well-nourished.  HENT:  Head: Normocephalic and atraumatic.  Right Ear: Tympanic membrane, external ear and ear canal normal.  Left Ear: Tympanic membrane, external ear and ear canal normal.    Mouth/Throat: Oropharynx is clear and moist.  Eyes: Conjunctivae are normal. Pupils are equal, round, and reactive to light.  Neck: Normal range of motion. Neck supple. No thyromegaly present.  Cardiovascular: Normal rate, regular rhythm, normal heart sounds and intact distal pulses.   Pulmonary/Chest: Effort normal and breath sounds normal.  Abdominal: Soft. Bowel sounds are normal.  Musculoskeletal: Normal range of motion.  Lymphadenopathy:    She has no cervical adenopathy.  Neurological: She is alert and oriented to person, place, and time. She has normal reflexes.  Skin: Skin is warm and dry.  Bilateral 5th toenails mildly thickened.  Psychiatric: She has a normal mood and affect. Her behavior is normal. Judgment and thought content normal.  Vitals reviewed.  BP 143/92 mmHg  Pulse 72  Temp(Src) 98.3 F (36.8 C) (Oral)  Resp 16  Ht 5' 4.5" (1.638 m)  Wt 256 lb 12.8 oz (116.484 kg)  BMI 43.41 kg/m2  SpO2 98%    Assessment & Plan:  1. Routine general medical examination at a health care facility - POCT urinalysis dipstick  2. Need for prophylactic vaccination and inoculation against influenza - Flu Vaccine QUAD 36+ mos IM  3. Obesity -Encouraged slow and steady weight loss with goal 1/2 pound per week -Provided information about Mediterranean Diet  4. Onychomycosis -She is not currently interested in using Lamisil at this time, may try some OTC/home remedies  5.  Hypothyroidism, unspecified hypothyroidism type -Last TSH 2 months ago normal, will recheck in 4 months.   Elby Beck, FNP-BC  [facility]  07/26/2014 3:07 PM

## 2014-07-24 NOTE — Patient Instructions (Signed)

## 2014-08-09 ENCOUNTER — Ambulatory Visit
Admission: RE | Admit: 2014-08-09 | Discharge: 2014-08-09 | Disposition: A | Discharge status: Home / Self Care | Payer: BLUE CROSS/BLUE SHIELD | Source: Ambulatory Visit

## 2014-08-09 DIAGNOSIS — Z1231 Encounter for screening mammogram for malignant neoplasm of breast: Secondary | ICD-10-CM

## 2014-10-01 ENCOUNTER — Ambulatory Visit (INDEPENDENT_AMBULATORY_CARE_PROVIDER_SITE_OTHER): Payer: BLUE CROSS/BLUE SHIELD | Admitting: Family Medicine

## 2014-10-01 ENCOUNTER — Encounter: Payer: Self-pay | Admitting: Family Medicine

## 2014-10-01 VITALS — BP 141/91 | HR 72 | Temp 98.6°F | Resp 16 | Ht 64.5 in | Wt 252.4 lb

## 2014-10-01 DIAGNOSIS — E039 Hypothyroidism, unspecified: Secondary | ICD-10-CM

## 2014-10-01 LAB — TSH: TSH: 3.139 u[IU]/mL (ref 0.350–4.500)

## 2014-10-01 NOTE — Progress Notes (Signed)
Here for a lab visit only, she plans to see me next month

## 2014-11-26 ENCOUNTER — Encounter: Payer: Self-pay | Admitting: Family Medicine

## 2014-12-10 ENCOUNTER — Ambulatory Visit: Payer: BLUE CROSS/BLUE SHIELD | Admitting: Family Medicine

## 2015-02-01 ENCOUNTER — Other Ambulatory Visit: Payer: Self-pay | Admitting: Family Medicine

## 2015-06-17 ENCOUNTER — Other Ambulatory Visit: Payer: Self-pay | Admitting: Family Medicine

## 2015-07-16 ENCOUNTER — Other Ambulatory Visit: Payer: Self-pay

## 2015-07-16 DIAGNOSIS — Z1231 Encounter for screening mammogram for malignant neoplasm of breast: Secondary | ICD-10-CM

## 2015-07-24 ENCOUNTER — Ambulatory Visit (INDEPENDENT_AMBULATORY_CARE_PROVIDER_SITE_OTHER): Payer: Commercial Managed Care - HMO | Admitting: Family Medicine

## 2015-07-24 ENCOUNTER — Encounter: Payer: Self-pay | Admitting: Family Medicine

## 2015-07-24 VITALS — BP 122/80 | HR 67 | Temp 98.6°F | Resp 16 | Ht 64.5 in | Wt 249.8 lb

## 2015-07-24 DIAGNOSIS — E669 Obesity, unspecified: Secondary | ICD-10-CM

## 2015-07-24 DIAGNOSIS — Z0000 Encounter for general adult medical examination without abnormal findings: Secondary | ICD-10-CM

## 2015-07-24 DIAGNOSIS — E038 Other specified hypothyroidism: Secondary | ICD-10-CM | POA: Diagnosis not present

## 2015-07-24 DIAGNOSIS — Z131 Encounter for screening for diabetes mellitus: Secondary | ICD-10-CM

## 2015-07-24 DIAGNOSIS — Z13220 Encounter for screening for lipoid disorders: Secondary | ICD-10-CM | POA: Diagnosis not present

## 2015-07-24 DIAGNOSIS — E034 Atrophy of thyroid (acquired): Secondary | ICD-10-CM

## 2015-07-24 DIAGNOSIS — Z130 Encounter for screening for diseases of the blood and blood-forming organs and certain disorders involving the immune mechanism: Secondary | ICD-10-CM | POA: Diagnosis not present

## 2015-07-24 LAB — HEMOGLOBIN A1C
Hgb A1c MFr Bld: 5.6 % (ref <5.7)
Mean Plasma Glucose: 114 mg/dL (ref <117)

## 2015-07-24 LAB — CBC
HCT: 41.5 % (ref 36.0–46.0)
Hemoglobin: 14 g/dL (ref 12.0–15.0)
MCH: 31 pg (ref 26.0–34.0)
MCHC: 33.7 g/dL (ref 30.0–36.0)
MCV: 92 fL (ref 78.0–100.0)
MPV: 8.8 fL (ref 8.6–12.4)
Platelets: 338 10*3/uL (ref 150–400)
RBC: 4.51 MIL/uL (ref 3.87–5.11)
RDW: 12.9 % (ref 11.5–15.5)
WBC: 9 10*3/uL (ref 4.0–10.5)

## 2015-07-24 MED ORDER — PHENTERMINE HCL 15 MG PO CAPS: 15.0000 mg | ORAL_CAPSULE | ORAL | Status: DC

## 2015-07-24 NOTE — Progress Notes (Addendum)
[facility] [address], [place] [address] [phone]  Date:  07/24/2015   Name:  [name]   DOB:  1970/10/13   MRN:  [id number]  PCP:  No primary care provider on file.    Chief Complaint: Annual Exam and Medication Refill   History of Present Illness:  [name] is a 44 y.o. very pleasant female patient who presents with the following:  Here today for a CPE. History of hypothyroidism and obesity, OW generally healthy.  Flu shot is UTD for the year.  Tetanus is UTD She works as a Education officer, community.   Mammo in January of this year.  Already scheduled for 2017 She had a hysterectomy due to benign causes- giant ovarian tumor.  She continues to see her OBG annually  She adopted a son 5 years ago. He is 56 years old and is doing well  She has been working hard to lose weight but this is really hard-  She has tried exercising with a group in a cross fix type format.  She did lose 20 lbs but then 10 came back  She is trying to really watch her diet.  Her dad was always obese. She is not at the point of considering bariatric surgery but is worried about her long term health.    Wt Readings from Last 3 Encounters:  07/24/15 249 lb 12.8 oz (113.309 kg)  10/01/14 252 lb 6.4 oz (114.488 kg)  07/24/14 256 lb 12.8 oz (116.484 kg)     Lab Results  Component Value Date   TSH 3.139 10/01/2014     Patient Active Problem List   Diagnosis Date Noted  . Obesity, unspecified 07/07/2013  . Hypothyroidism 09/27/2011    Past Medical History  Diagnosis Date  . Benign ovarian tumor July 2012    17 pound benign tumor- removed  . Anxiety   . Thyroid disease   . Depression     Past Surgical History  Procedure Laterality Date  . Abdominal hysterectomy  2012    one ovary remains- due to giant benign ovarian cyst (17 lbs)  . Ovarian cyst removal      Social History  Substance Use Topics  . Smoking status: Never Smoker   . Smokeless  tobacco: Never Used  . Alcohol Use: No     Comment: socially    Family History  Problem Relation Age of Onset  . Heart disease Father   . Stroke Father   . Heart attack Father   . Hyperlipidemia Father   . Hypertension Father   . Heart disease Paternal Grandfather   . Heart attack Paternal Grandfather   . COPD Mother   . Congestive Heart Failure Mother   . Gallbladder disease Maternal Grandmother   . Stroke Paternal Grandmother     Allergies  Allergen Reactions  . Levothyroxine Sodium Anxiety and Palpitations    Medication list has been reviewed and updated.  Current Outpatient Prescriptions on File Prior to Visit  Medication Sig Dispense Refill  . thyroid (ARMOUR THYROID) 30 MG tablet TAKE 2 AND 1/2 TABLETS BY MOUTH ONCE DAILY BEFORE BREAKFAST 225 tablet 0  . Multiple Vitamins-Minerals (MULTIVITAMIN WITH MINERALS) tablet Take 1 tablet by mouth daily. Reported on 07/24/2015    . Omega-3 Fatty Acids (FISH OIL PO) Take by mouth. Reported on 07/24/2015    . vitamin B-12 (CYANOCOBALAMIN) 100 MCG tablet Take 100 mcg by mouth daily. Reported on 07/24/2015  No current facility-administered medications on file prior to visit.    Review of Systems:  As per HPI- otherwise negative.   Physical Examination: Filed Vitals:   07/24/15 0949  BP: 128/90  Pulse: 67  Temp: 98.6 F (37 C)  Resp: 16   Filed Vitals:   07/24/15 0949  Height: 5' 4.5" (1.638 m)  Weight: 249 lb 12.8 oz (113.309 kg)   Body mass index is 42.23 kg/(m^2). Ideal Body Weight: Weight in (lb) to have BMI = 25: 147.6  GEN: WDWN, NAD, Non-toxic, A & O x 3, obese, looks well HEENT: Atraumatic, Normocephalic. Neck supple. No masses, No LAD.  Bilateral TM wnl, oropharynx normal.  PEERL,EOMI.   Ears and Nose: No external deformity. CV: RRR, No M/G/R. No JVD. No thrill. No extra heart sounds. PULM: CTA B, no wheezes, crackles, rhonchi. No retractions. No resp. distress. No accessory muscle use. ABD: S, NT,  ND. No rebound. No HSM. EXTR: No c/c/e NEURO Normal gait.  PSYCH: Normally interactive. Conversant. Not depressed or anxious appearing.  Calm demeanor.    Assessment and Plan: Physical exam  Hypothyroidism due to acquired atrophy of thyroid - Plan: TSH  Obesity - Plan: phentermine 15 MG capsule  Screening for diabetes mellitus - Plan: Comprehensive metabolic panel, Hemoglobin A1c  Screening for hyperlipidemia - Plan: Lipid panel  Screening for deficiency anemia - Plan: CBC  CPE today- overall she is healthy except for her weight Will check her TSH- adjust if needed.  Assuming this is ok she is interested in trying phentermine. Did this rx and discussed with her.   Encouraged and praised her weight loss efforts so far  Signed Lamar Blinks, MD  Results for orders placed or performed in visit on 07/24/15  CBC  Result Value Ref Range   WBC 9.0 4.0 - 10.5 K/uL   RBC 4.51 3.87 - 5.11 MIL/uL   Hemoglobin 14.0 12.0 - 15.0 g/dL   HCT 41.5 36.0 - 46.0 %   MCV 92.0 78.0 - 100.0 fL   MCH 31.0 26.0 - 34.0 pg   MCHC 33.7 30.0 - 36.0 g/dL   RDW 12.9 11.5 - 15.5 %   Platelets 338 150 - 400 K/uL   MPV 8.8 8.6 - 12.4 fL  Comprehensive metabolic panel  Result Value Ref Range   Sodium 134 (L) 135 - 146 mmol/L   Potassium 4.0 3.5 - 5.3 mmol/L   Chloride 101 98 - 110 mmol/L   CO2 24 20 - 31 mmol/L   Glucose, Bld 81 65 - 99 mg/dL   BUN 12 7 - 25 mg/dL   Creat 0.58 0.50 - 1.10 mg/dL   Total Bilirubin 0.6 0.2 - 1.2 mg/dL   Alkaline Phosphatase 62 33 - 115 U/L   AST 13 10 - 35 U/L   ALT 12 6 - 29 U/L   Total Protein 7.4 6.1 - 8.1 g/dL   Albumin 4.3 3.6 - 5.1 g/dL   Calcium 9.3 8.6 - 10.2 mg/dL  Lipid panel  Result Value Ref Range   Cholesterol 195 125 - 200 mg/dL   Triglycerides 116 <150 mg/dL   HDL 37 (L) >=46 mg/dL   Total CHOL/HDL Ratio 5.3 (H) <=5.0 Ratio   VLDL 23 <30 mg/dL   LDL Cholesterol 135 (H) <130 mg/dL  TSH  Result Value Ref Range   TSH 4.646 (H) 0.350 - 4.500  uIU/mL  Hemoglobin A1c  Result Value Ref Range   Hgb A1c MFr Bld 5.6 <5.7 %  Mean Plasma Glucose 114 <117 mg/dL

## 2015-07-24 NOTE — Patient Instructions (Addendum)
It was great to see you today- I will be in touch with your labs and we can adjust your thyroid med if needed.  Assuming that your thyroid level is ok, we can try phentermine for weight loss.  Start with 1 tablet daily. If your BP is doing ok (less than 140/90 on average) you can increase to 2 tablets daily. Please keep me posted and come see me in about 6 weeks for a recheck.

## 2015-07-25 LAB — COMPREHENSIVE METABOLIC PANEL
ALT: 12 U/L (ref 6–29)
AST: 13 U/L (ref 10–35)
Albumin: 4.3 g/dL (ref 3.6–5.1)
Alkaline Phosphatase: 62 U/L (ref 33–115)
BUN: 12 mg/dL (ref 7–25)
CO2: 24 mmol/L (ref 20–31)
Calcium: 9.3 mg/dL (ref 8.6–10.2)
Chloride: 101 mmol/L (ref 98–110)
Creat: 0.58 mg/dL (ref 0.50–1.10)
Glucose, Bld: 81 mg/dL (ref 65–99)
Potassium: 4 mmol/L (ref 3.5–5.3)
Sodium: 134 mmol/L — ABNORMAL LOW (ref 135–146)
Total Bilirubin: 0.6 mg/dL (ref 0.2–1.2)
Total Protein: 7.4 g/dL (ref 6.1–8.1)

## 2015-07-25 LAB — LIPID PANEL
Cholesterol: 195 mg/dL (ref 125–200)
HDL: 37 mg/dL — ABNORMAL LOW (ref >=46)
LDL Cholesterol: 135 mg/dL — ABNORMAL HIGH (ref <130)
Total CHOL/HDL Ratio: 5.3 Ratio — ABNORMAL HIGH (ref <=5.0)
Triglycerides: 116 mg/dL (ref <150)
VLDL: 23 mg/dL (ref <30)

## 2015-07-25 LAB — TSH: TSH: 4.646 u[IU]/mL — ABNORMAL HIGH (ref 0.350–4.500)

## 2015-08-13 ENCOUNTER — Ambulatory Visit
Admission: RE | Admit: 2015-08-13 | Discharge: 2015-08-13 | Disposition: A | Discharge status: Home / Self Care | Payer: Commercial Managed Care - HMO | Source: Ambulatory Visit

## 2015-08-13 DIAGNOSIS — Z1231 Encounter for screening mammogram for malignant neoplasm of breast: Secondary | ICD-10-CM

## 2015-10-15 ENCOUNTER — Other Ambulatory Visit: Payer: Self-pay | Admitting: Family Medicine

## 2016-04-20 ENCOUNTER — Other Ambulatory Visit: Payer: Self-pay | Admitting: Family Medicine

## 2016-04-21 ENCOUNTER — Encounter: Payer: Self-pay | Admitting: Family Medicine

## 2016-04-21 ENCOUNTER — Other Ambulatory Visit: Payer: Self-pay | Admitting: Emergency Medicine

## 2016-04-21 MED ORDER — THYROID 30 MG PO TABS: ORAL_TABLET | ORAL | 0 refills | Status: DC

## 2016-04-21 NOTE — Telephone Encounter (Signed)
Received refill request for thyroid (ARMOUR THYROID) 30 MG tablet. There is an Allergy/Contraindication: Levothyroxine Sodium. Will it be ok to refill? Please advise.

## 2016-05-05 ENCOUNTER — Other Ambulatory Visit: Payer: Self-pay | Admitting: Family Medicine

## 2016-05-22 ENCOUNTER — Telehealth: Payer: Self-pay | Admitting: *Deleted

## 2016-05-22 NOTE — Telephone Encounter (Signed)
Unable to reach patient at time of Pre-Visit Call.  Left message for patient to return call when available.    

## 2016-05-25 ENCOUNTER — Encounter: Payer: Self-pay | Admitting: Family Medicine

## 2016-05-25 ENCOUNTER — Ambulatory Visit (INDEPENDENT_AMBULATORY_CARE_PROVIDER_SITE_OTHER): Payer: Managed Care, Other (non HMO) | Admitting: Family Medicine

## 2016-05-25 VITALS — BP 118/78 | HR 85 | Temp 98.4°F | Ht 64.25 in | Wt 264.6 lb

## 2016-05-25 DIAGNOSIS — Z13220 Encounter for screening for lipoid disorders: Secondary | ICD-10-CM | POA: Diagnosis not present

## 2016-05-25 DIAGNOSIS — R42 Dizziness and giddiness: Secondary | ICD-10-CM

## 2016-05-25 DIAGNOSIS — Z130 Encounter for screening for diseases of the blood and blood-forming organs and certain disorders involving the immune mechanism: Secondary | ICD-10-CM

## 2016-05-25 DIAGNOSIS — Z0000 Encounter for general adult medical examination without abnormal findings: Secondary | ICD-10-CM | POA: Diagnosis not present

## 2016-05-25 DIAGNOSIS — E034 Atrophy of thyroid (acquired): Secondary | ICD-10-CM

## 2016-05-25 DIAGNOSIS — Z131 Encounter for screening for diabetes mellitus: Secondary | ICD-10-CM | POA: Diagnosis not present

## 2016-05-25 NOTE — Progress Notes (Signed)
[facility] [address], [place], [place] [address] [phone]  Date:  05/25/2016   Name:  [name]   DOB:  10/15/1970   MRN:  [id number]  PCP:  Lamar Blinks, MD    Chief Complaint: Annual Exam ( Pt will need lab work and refill on thyroid med. )   History of Present Illness:  [name] is a 45 y.o. very pleasant female patient who presents with the following:  Here today for a CPE.   Her son is 72 yo, he is doing well.    She still sees GYN- last seen early this year.   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    In August of this year she first noted a feeling of dizziness.  She would feel "swimmy," sx may occur at rest or when moving.  She had to pull her car over once as she did not feel that she could drive.  She has a hard time describing the sx exactly but says it is not lightheadedness or spinning per se Sx may last 15 - 30 seconds.  Occurs on average once a week- at the start it might happen 15 times a week.   She has not had any slurred speech, numbness or difficulty moving her body during these episodes  She does not wear corrective lenses and has not noted any change in her vision. She has not had a recent eye exam  mammo is UTD Patient Active Problem List   Diagnosis Date Noted  . Obesity, unspecified 07/07/2013  . Hypothyroidism 09/27/2011    Past Medical History:  Diagnosis Date  . Anxiety   . Benign ovarian tumor July 2012   17 pound benign tumor- removed   . Depression   . Thyroid disease     Past Surgical History:  Procedure Laterality Date  . ABDOMINAL HYSTERECTOMY  2012   one ovary remains- due to giant benign ovarian cyst (17 lbs)  . OVARIAN CYST REMOVAL      Social History  Substance Use Topics  . Smoking status: Never Smoker  . Smokeless tobacco: Never Used  . Alcohol use No     Comment: socially    Family History  Problem Relation Age of Onset  . Heart disease Father   . Stroke Father   . Heart attack Father   . Hyperlipidemia Father   . Hypertension Father   . Heart disease Paternal Grandfather   . Heart attack Paternal Grandfather   . COPD Mother   . Congestive Heart Failure Mother   . Gallbladder disease Maternal Grandmother   . Stroke Paternal Grandmother     Allergies  Allergen Reactions  . Levothyroxine Sodium Anxiety and Palpitations    Medication list has been reviewed and updated.  Current Outpatient Prescriptions on File Prior to Visit  Medication Sig Dispense Refill  . Multiple Vitamins-Minerals (MULTIVITAMIN WITH MINERALS) tablet Take 1  tablet by mouth daily. Reported on 07/24/2015    . Omega-3 Fatty Acids (FISH OIL PO) Take by mouth. Reported on 07/24/2015    . phentermine 15 MG capsule Take 1 capsule (15 mg total) by mouth every morning. May increase to 2 tablets after 1-2 weeks 60 capsule 3  . thyroid (ARMOUR THYROID) 30 MG tablet take 2 and 1/2 tablets by mouth once daily BEFORE BREAKFAST 225 tablet 0  . vitamin B-12 (CYANOCOBALAMIN) 100 MCG tablet Take 100 mcg by mouth daily. Reported on 07/24/2015     No current facility-administered medications on file prior to visit.     Review of Systems:  As per HPI- otherwise negative.  No palpitations, CP or SOB.  No fever or chills, no weight loss    Physical Examination: Vitals:   05/25/16 1513  BP: 118/78  Pulse: 85  Temp: 98.4 F (36.9 C)   Vitals:   05/25/16 1513  Weight: 264 lb 9.6 oz (120 kg)  Height: 5' 4.25" (1.632 m)   Body  mass index is 45.07 kg/m. Ideal Body Weight: Weight in (lb) to have BMI = 25: 146.5  GEN: WDWN, NAD, Non-toxic, A & O x 3, obese, looks well HEENT: Atraumatic, Normocephalic. Neck supple. No masses, No LAD. Bilateral TM wnl, oropharynx normal.  PEERL,EOMI.   Ears and Nose: No external deformity. CV: RRR, No M/G/R. No JVD. No thrill. No extra heart sounds. PULM: CTA B, no wheezes, crackles, rhonchi. No retractions. No resp. distress. No accessory muscle use. ABD: S, NT, ND, +BS. No rebound. No HSM. EXTR: No c/c/e NEURO Normal gait.  Normal strength, sensation, and DTR all extremities.  Normal RAM. Normal facial sensation and movement  PSYCH: Normally interactive. Conversant. Not depressed or anxious appearing.  Calm demeanor.   Orthostatic VS for the past 24 hrs:  BP- Lying Pulse- Lying BP- Sitting Pulse- Sitting BP- Standing at 0 minutes Pulse- Standing at 0 minutes  05/25/16 1541 132/88 74 122/86 73 126/84 74      Assessment and Plan: Routine general medical examination at a health care facility  Hypothyroidism due to acquired atrophy of thyroid - Plan: TSH  Screening for deficiency anemia - Plan: CBC  Screening for hyperlipidemia - Plan: Lipid panel  Screening for diabetes mellitus - Plan: Comprehensive metabolic panel, Hemoglobin A1C  Dizziness and giddiness - Plan: Ambulatory referral to Neurology  Here today for an annual exam, labs She is also having some unusual feeling of "dizziness" for the last 3 months. [name] is not one to mention any symptoms so I do think we need to look into this further.  Will start with labs as above, she is not orthostatic and does not have any arrhythmia on exam.  Will refer to neurology   Signed Lamar Blinks, MD

## 2016-05-25 NOTE — Progress Notes (Signed)
Pre visit review using our clinic review tool, if applicable. No additional management support is needed unless otherwise documented below in the visit note. 

## 2016-05-26 LAB — COMPREHENSIVE METABOLIC PANEL
ALT: 14 U/L (ref 0–35)
AST: 15 U/L (ref 0–37)
Albumin: 4.3 g/dL (ref 3.5–5.2)
Alkaline Phosphatase: 61 U/L (ref 39–117)
BUN: 15 mg/dL (ref 6–23)
CO2: 28 mEq/L (ref 19–32)
Calcium: 9.5 mg/dL (ref 8.4–10.5)
Chloride: 103 mEq/L (ref 96–112)
Creatinine, Ser: 0.65 mg/dL (ref 0.40–1.20)
GFR: 104.34 mL/min (ref >60.00)
Glucose, Bld: 87 mg/dL (ref 70–99)
Potassium: 3.6 mEq/L (ref 3.5–5.1)
Sodium: 138 mEq/L (ref 135–145)
Total Bilirubin: 0.4 mg/dL (ref 0.2–1.2)
Total Protein: 7.5 g/dL (ref 6.0–8.3)

## 2016-05-26 LAB — CBC
HCT: 39.9 % (ref 36.0–46.0)
Hemoglobin: 13.6 g/dL (ref 12.0–15.0)
MCHC: 34 g/dL (ref 30.0–36.0)
MCV: 91.5 fl (ref 78.0–100.0)
Platelets: 346 10*3/uL (ref 150.0–400.0)
RBC: 4.36 Mil/uL (ref 3.87–5.11)
RDW: 12.6 % (ref 11.5–15.5)
WBC: 11 10*3/uL — ABNORMAL HIGH (ref 4.0–10.5)

## 2016-05-26 LAB — LIPID PANEL
Cholesterol: 194 mg/dL (ref 0–200)
HDL: 40.7 mg/dL (ref >39.00)
LDL Cholesterol: 114 mg/dL — ABNORMAL HIGH (ref 0–99)
NonHDL: 152.92
Total CHOL/HDL Ratio: 5
Triglycerides: 197 mg/dL — ABNORMAL HIGH (ref 0.0–149.0)
VLDL: 39.4 mg/dL (ref 0.0–40.0)

## 2016-05-26 LAB — HEMOGLOBIN A1C: Hgb A1c MFr Bld: 5.7 % (ref 4.6–6.5)

## 2016-05-26 LAB — TSH: TSH: 2.41 u[IU]/mL (ref 0.35–4.50)

## 2016-06-05 ENCOUNTER — Encounter: Payer: Self-pay | Admitting: Family Medicine

## 2016-06-15 ENCOUNTER — Ambulatory Visit (INDEPENDENT_AMBULATORY_CARE_PROVIDER_SITE_OTHER): Payer: Managed Care, Other (non HMO) | Admitting: Family Medicine

## 2016-06-15 VITALS — BP 122/82 | HR 87 | Wt 263.6 lb

## 2016-06-15 DIAGNOSIS — R202 Paresthesia of skin: Secondary | ICD-10-CM | POA: Diagnosis not present

## 2016-06-15 DIAGNOSIS — F40240 Claustrophobia: Secondary | ICD-10-CM

## 2016-06-15 DIAGNOSIS — E559 Vitamin D deficiency, unspecified: Secondary | ICD-10-CM

## 2016-06-15 LAB — VITAMIN B12: Vitamin B-12: 266 pg/mL (ref 211–911)

## 2016-06-15 LAB — CBC
HCT: 41.3 % (ref 36.0–46.0)
Hemoglobin: 14.1 g/dL (ref 12.0–15.0)
MCHC: 34.1 g/dL (ref 30.0–36.0)
MCV: 91.8 fl (ref 78.0–100.0)
Platelets: 363 10*3/uL (ref 150.0–400.0)
RBC: 4.5 Mil/uL (ref 3.87–5.11)
RDW: 13 % (ref 11.5–15.5)
WBC: 10.4 10*3/uL (ref 4.0–10.5)

## 2016-06-15 LAB — VITAMIN D 25 HYDROXY (VIT D DEFICIENCY, FRACTURES): VITD: 15.44 ng/mL — ABNORMAL LOW (ref 30.00–100.00)

## 2016-06-15 LAB — FERRITIN: Ferritin: 51.1 ng/mL (ref 10.0–291.0)

## 2016-06-15 LAB — FOLATE: Folate: 19.2 ng/mL (ref >5.9)

## 2016-06-15 LAB — C-REACTIVE PROTEIN: CRP: 1 mg/dL (ref 0.5–20.0)

## 2016-06-15 MED ORDER — LORAZEPAM 1 MG PO TABS: ORAL_TABLET | ORAL | 0 refills | Status: DC

## 2016-06-15 MED ORDER — VITAMIN D (ERGOCALCIFEROL) 1.25 MG (50000 UNIT) PO CAPS: 50000.0000 [IU] | ORAL_CAPSULE | ORAL | 0 refills | Status: DC

## 2016-06-15 NOTE — Progress Notes (Signed)
[facility] [facility] [address] [place], [place] [address] [phone]  Date:  06/15/2016   Name:  [name]   DOB:  Sep 16, 1970   MRN:  [id number]  PCP:  Sierra Blinks, MD    Chief Complaint: Headache (c/o headache, prickling in fingers, toes, and hand. c/o weird sensations in back of tongue that comes and goes, and one episode of blurry vision, pt has that she has had other episodes vision changes. Sx's have been present off and on for the past 3 months and have worsened . )   History of Present Illness:  [name] is a 45 y.o. very pleasant female patient who presents with the following:  See OV from 10/23- we had decided to have her see neurology but this visit is still pending.  Partal HPI from that visit: In August of this year she first noted a feeling of dizziness.  She would feel "swimmy," sx may occur at rest or when moving.  She had to pull her car over once as she did not feel that she could drive.  She has a hard time describing the sx exactly but says it is not lightheadedness or spinning per se Sx may last 15 - 30 seconds.  Occurs on average once a week- at the start it might happen 15 times a week.   She has not had any slurred speech, numbness or difficulty moving her body during these episodes  Over the last couple of weeks she has noted some strange feelings in her skin- she will feel a sort of tingling that seems to be superficial. It is mostly in her head and upper back. Even her tongue can sometimes feel tingly.   She has noted more frequent HA than she normally has for the last 2 weeks (or more) No fever or chills.  No nausea or vomiting She has had a couple more of the "swimmy episodes" where she will feel like her vision is "swimmy" and somewhat blurry.   She is seeing optometry on Thursday of this week to have her eyes checked  She has never had an MRI in the past, but does not have any  metallic devices in her body so she can have an MR There is no family history of MS.  However there is a family history of ALS- not immediate family members but cousins, etc.    She admits that she has been under stress recently and wonders if her sx could be due to this. She does not consciously feel like she is "stressed out" but is aware that her symptoms could be coming from a psychological issue Patient Active Problem List   Diagnosis Date Noted  . Obesity, unspecified 07/07/2013  . Hypothyroidism 09/27/2011    Past Medical History:  Diagnosis Date  . Anxiety   . Benign ovarian tumor July 2012   17 pound benign tumor- removed  . Depression   . Thyroid disease     Past Surgical History:  Procedure Laterality Date  . ABDOMINAL HYSTERECTOMY  2012   one ovary remains- due to giant benign ovarian cyst (17 lbs)  . OVARIAN CYST REMOVAL      Social History  Substance Use Topics  . Smoking status: Never Smoker  . Smokeless tobacco: Never Used  . Alcohol use No     Comment: socially    Family History  Problem Relation Age of Onset  . Heart disease Father   .  Stroke Father   . Heart attack Father   . Hyperlipidemia Father   . Hypertension Father   . Heart disease Paternal Grandfather   . Heart attack Paternal Grandfather   . COPD Mother   . Congestive Heart Failure Mother   . Gallbladder disease Maternal Grandmother   . Stroke Paternal Grandmother     Allergies  Allergen Reactions  . Levothyroxine Sodium Anxiety and Palpitations    Medication list has been reviewed and updated.  Current Outpatient Prescriptions on File Prior to Visit  Medication Sig Dispense Refill  . Multiple Vitamins-Minerals (MULTIVITAMIN WITH MINERALS) tablet Take 1 tablet by mouth daily. Reported on 07/24/2015    . thyroid (ARMOUR THYROID) 30 MG tablet take 2 and 1/2 tablets by mouth once daily BEFORE BREAKFAST 225 tablet 0   No current facility-administered medications on file prior to  visit.     Review of Systems:  As per HPI- otherwise negative.   Physical Examination: Vitals:   06/15/16 1143  BP: 122/82  Pulse: 87   Vitals:   06/15/16 1143  Weight: 263 lb 9.6 oz (119.6 kg)   Body mass index is 44.9 kg/m. Ideal Body Weight:    GEN: WDWN, NAD, Non-toxic, A & O x 3, overweight, accompanied by her wife [name] today.  Looks well HEENT: Atraumatic, Normocephalic. Neck supple. No masses, No LAD.  Bilateral TM wnl, oropharynx normal.  PEERL,EOMI.   Ears and Nose: No external deformity. CV: RRR, No M/G/R. No JVD. No thrill. No extra heart sounds. PULM: CTA B, no wheezes, crackles, rhonchi. No retractions. No resp. distress. No accessory muscle use. ABD: S, NT, ND, +BS. No rebound. No HSM. EXTR: No c/c/e NEURO Normal gait.   Complete neuro exam including strength, sensation and DTR of all extremities, RAM, finger to nose, romberg and facial movement all normal PSYCH: Normally interactive. Conversant. Not depressed or anxious appearing.  Calm demeanor.   Assessment and Plan: Paresthesias - Plan: B12, Folate, Vitamin D (25 hydroxy), Ferritin, CBC, C-reactive protein  Claustrophobia - Plan: LORazepam (ATIVAN) 1 MG tablet  Here today to follow-up continued paraesthesias and other vague neurological sx as above Plan to arrange imaging of her head-message to Dr. Tomi Likens who she will see in January inquiring about his preferred imaging modality Further BW as above Gave rx for a few ativan to use prior to MRI as needed; cautioned not to drive if taking this medication  Signed Sierra Blinks, MD

## 2016-06-15 NOTE — Patient Instructions (Signed)
It was good to see you today!  Please have your labs drawn and I will order an MRI of your head for you Take care and let me know if any changes in your condition

## 2016-06-15 NOTE — Progress Notes (Signed)
Pre visit review using our clinic review tool, if applicable. No additional management support is needed unless otherwise documented below in the visit note. 

## 2016-06-20 ENCOUNTER — Ambulatory Visit (HOSPITAL_BASED_OUTPATIENT_CLINIC_OR_DEPARTMENT_OTHER)
Admission: RE | Admit: 2016-06-20 | Discharge: 2016-06-20 | Disposition: A | Discharge status: Home / Self Care | Payer: Managed Care, Other (non HMO) | Source: Ambulatory Visit | Attending: Family Medicine | Admitting: Family Medicine

## 2016-06-20 DIAGNOSIS — R202 Paresthesia of skin: Secondary | ICD-10-CM | POA: Diagnosis not present

## 2016-06-20 MED ORDER — GADOBENATE DIMEGLUMINE 529 MG/ML IV SOLN: 20.0000 mL | Freq: Once | INTRAVENOUS | Status: DC | PRN

## 2016-08-11 ENCOUNTER — Other Ambulatory Visit: Payer: Self-pay | Admitting: Family Medicine

## 2016-08-11 DIAGNOSIS — Z1231 Encounter for screening mammogram for malignant neoplasm of breast: Secondary | ICD-10-CM

## 2016-08-12 ENCOUNTER — Encounter: Payer: Self-pay | Admitting: Neurology

## 2016-08-12 ENCOUNTER — Ambulatory Visit (INDEPENDENT_AMBULATORY_CARE_PROVIDER_SITE_OTHER): Payer: Managed Care, Other (non HMO) | Admitting: Neurology

## 2016-08-12 VITALS — BP 118/72 | HR 73 | Ht 64.0 in | Wt 263.5 lb

## 2016-08-12 DIAGNOSIS — R519 Headache, unspecified: Secondary | ICD-10-CM

## 2016-08-12 DIAGNOSIS — H539 Unspecified visual disturbance: Secondary | ICD-10-CM

## 2016-08-12 DIAGNOSIS — R202 Paresthesia of skin: Secondary | ICD-10-CM | POA: Diagnosis not present

## 2016-08-12 DIAGNOSIS — R51 Headache: Secondary | ICD-10-CM

## 2016-08-12 NOTE — Patient Instructions (Signed)
I am not sure the exact cause of your symptoms.  However, it does not seem to be anything serious.  MRI revealed no evidence of stroke, tumor or multiple sclerosis.  Possibly viral related or vitamin deficiency related.  Migraine is always a possibility.  I would just continue to monitor to see if it continues to resolve.  If not, we can recheck B12 level.
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
## 2016-08-14 ENCOUNTER — Ambulatory Visit
Admission: RE | Admit: 2016-08-14 | Discharge: 2016-08-14 | Disposition: A | Discharge status: Home / Self Care | Payer: Managed Care, Other (non HMO) | Source: Ambulatory Visit | Attending: Family Medicine | Admitting: Family Medicine

## 2016-08-14 DIAGNOSIS — Z1231 Encounter for screening mammogram for malignant neoplasm of breast: Secondary | ICD-10-CM

## 2016-08-20 ENCOUNTER — Encounter: Payer: Self-pay | Admitting: Family Medicine

## 2016-09-05 ENCOUNTER — Other Ambulatory Visit: Payer: Self-pay | Admitting: Family Medicine

## 2016-09-08 ENCOUNTER — Ambulatory Visit (INDEPENDENT_AMBULATORY_CARE_PROVIDER_SITE_OTHER): Payer: Managed Care, Other (non HMO) | Admitting: Family

## 2016-09-08 ENCOUNTER — Encounter: Payer: Self-pay | Admitting: Family

## 2016-09-08 VITALS — BP 124/80 | HR 74 | Temp 99.6°F | Resp 16 | Ht 64.0 in | Wt 264.0 lb

## 2016-09-08 DIAGNOSIS — R52 Pain, unspecified: Secondary | ICD-10-CM

## 2016-09-08 DIAGNOSIS — J069 Acute upper respiratory infection, unspecified: Secondary | ICD-10-CM

## 2016-09-08 LAB — POCT INFLUENZA A/B
Influenza A, POC: NEGATIVE
Influenza B, POC: NEGATIVE

## 2016-09-08 MED ORDER — PROMETHAZINE-CODEINE 6.25-10 MG/5ML PO SYRP: 5.0000 mL | ORAL_SOLUTION | Freq: Four times a day (QID) | ORAL | 0 refills | Status: DC | PRN

## 2016-09-08 NOTE — Assessment & Plan Note (Signed)
Symptoms and exam consistent with acute upper respiratory infection. In office influenza test negative. Continue over-the-counter medications as needed for symptom relief and supportive care. Start promethazine-codeine as needed for cough and sleep. Follow-up as needed.

## 2016-09-08 NOTE — Patient Instructions (Signed)
Thank you for choosing [facility].  SUMMARY AND INSTRUCTIONS:  Medication:  Your prescription(s) have been submitted to your pharmacy or been printed and provided for you. Please take as directed and contact our office if you believe you are having problem(s) with the medication(s) or have any questions.  Follow up:  If your symptoms worsen or fail to improve, please contact our office for further instruction, or in case of emergency go directly to the emergency room at the closest medical facility.    General Recommendations:    Please drink plenty of fluids.  Get plenty of rest   Sleep in humidified air  Use saline nasal sprays  Netti pot   OTC Medications:  Decongestants - helps relieve congestion   Flonase (generic fluticasone) or Nasacort (generic triamcinolone) - please make sure to use the "cross-over" technique at a 45 degree angle towards the opposite eye as opposed to straight up the nasal passageway.   Sudafed (generic pseudoephedrine - Note this is the one that is available behind the pharmacy counter); Products with phenylephrine (-PE) may also be used but is often not as effective as pseudoephedrine.   If you have HIGH BLOOD PRESSURE - Coricidin HBP; AVOID any product that is -D as this contains pseudoephedrine which may increase your blood pressure.  Afrin (oxymetazoline) every 6-8 hours for up to 3 days.   Allergies - helps relieve runny nose, itchy eyes and sneezing   Claritin (generic loratidine), Allegra (fexofenidine), or Zyrtec (generic cyrterizine) for runny nose. These medications should not cause drowsiness.  Note - Benadryl (generic diphenhydramine) may be used however may cause drowsiness  Cough -   Delsym or Robitussin (generic dextromethorphan)  Expectorants - helps loosen mucus to ease removal   Mucinex (generic guaifenesin) as directed on the package.  Headaches / General Aches   Tylenol (generic acetaminophen) - DO NOT  EXCEED 3 grams (3,000 mg) in a 24 hour time period  Advil/Motrin (generic ibuprofen)   Sore Throat -   Salt water gargle   Chloraseptic (generic benzocaine) spray or lozenges / Sucrets (generic dyclonine)    Upper Respiratory Infection, Adult Most upper respiratory infections (URIs) are caused by a virus. A URI affects the nose, throat, and upper air passages. The most common type of URI is often called "the common cold." Follow these instructions at home:  Take medicines only as told by your doctor.  Gargle warm saltwater or take cough drops to comfort your throat as told by your doctor.  Use a warm mist humidifier or inhale steam from a shower to increase air moisture. This may make it easier to breathe.  Drink enough fluid to keep your pee (urine) clear or pale yellow.  Eat soups and other clear broths.  Have a healthy diet.  Rest as needed.  Go back to work when your fever is gone or your doctor says it is okay.  You may need to stay home longer to avoid giving your URI to others.  You can also wear a face mask and wash your hands often to prevent spread of the virus.  Use your inhaler more if you have asthma.  Do not use any tobacco products, including cigarettes, chewing tobacco, or electronic cigarettes. If you need help quitting, ask your doctor. Contact a doctor if:  You are getting worse, not better.  Your symptoms are not helped by medicine.  You have chills.  You are getting more short of breath.  You have brown or red  mucus.  You have yellow or brown discharge from your nose.  You have pain in your face, especially when you bend forward.  You have a fever.  You have puffy (swollen) neck glands.  You have pain while swallowing.  You have white areas in the back of your throat. Get help right away if:  You have very bad or constant:  Headache.  Ear pain.  Pain in your forehead, behind your eyes, and over your cheekbones (sinus  pain).  Chest pain.  You have long-lasting (chronic) lung disease and any of the following:  Wheezing.  Long-lasting cough.  Coughing up blood.  A change in your usual mucus.  You have a stiff neck.  You have changes in your:  Vision.  Hearing.  Thinking.  Mood. This information is not intended to replace advice given to you by your health care provider. Make sure you discuss any questions you have with your health care provider. Document Released: 01/06/2008 Document Revised: 03/22/2016 Document Reviewed: 10/25/2013 Elsevier Interactive Patient Education  2017 [employer].

## 2016-09-08 NOTE — Progress Notes (Signed)
Subjective:    Patient ID: [name], female    DOB: May 24, 1971, 46 y.o.   MRN: [id number]  Chief Complaint  Patient presents with  . Nasal Congestion    headaches, coughing, congestion, and mild body aches x5 days    HPI:  [name] is a 46 y.o. female who  has a past medical history of Anxiety; Benign ovarian tumor (July 2012); Depression; and Thyroid disease. and presents today for an acute office visit.   Associated symptoms of headaches, congestion, cough and body aches has been going on for about 5 days. Denies fevers. Cough is productive with yellow sputum. Modifying factors include Mucinex and Dayquil which have not helped very much. No recent antibiotics.   Allergies  Allergen Reactions  . Levothyroxine Sodium Anxiety and Palpitations      Outpatient Medications Prior to Visit  Medication Sig Dispense Refill  . Multiple Vitamins-Minerals (MULTIVITAMIN WITH MINERALS) tablet Take 1 tablet by mouth daily. Reported on 07/24/2015    . thyroid (ARMOUR THYROID) 30 MG tablet take 2 and 1/2 tablets by mouth once daily BEFORE BREAKFAST 225 tablet 0   No facility-administered medications prior to visit.      Review of Systems  Constitutional: Negative for chills and fever.  HENT: Positive for congestion. Negative for ear pain, sinus pain, sinus pressure and sore throat.   Respiratory: Positive for cough. Negative for chest tightness, shortness of breath and wheezing.       Objective:    BP 124/80 (BP Location: Left Arm, Patient Position: Sitting, Cuff Size: Large)   Pulse 74   Temp 99.6 F (37.6 C) (Oral)   Resp 16   Ht 5\' 4"  (1.626 m)   Wt 264 lb (119.7 kg)   SpO2 96%   BMI 45.32 kg/m  Nursing note and vital signs reviewed.  Physical Exam  Constitutional: She is oriented to person, place, and time. She appears well-developed and well-nourished. No distress.  HENT:  Right Ear: Hearing, tympanic membrane, external ear and ear canal normal.  Left Ear:  Hearing, tympanic membrane, external ear and ear canal normal.  Nose: Nose normal.  Mouth/Throat: Uvula is midline, oropharynx is clear and moist and mucous membranes are normal.  Cardiovascular: Normal rate, regular rhythm, normal heart sounds and intact distal pulses.   Pulmonary/Chest: Effort normal and breath sounds normal.  Neurological: She is alert and oriented to person, place, and time.  Skin: Skin is warm and dry.  Psychiatric: She has a normal mood and affect. Her behavior is normal. Judgment and thought content normal.       Assessment & Plan:   Problem List Items Addressed This Visit      Respiratory   Acute upper respiratory infection    Symptoms and exam consistent with acute upper respiratory infection. In office influenza test negative. Continue over-the-counter medications as needed for symptom relief and supportive care. Start promethazine-codeine as needed for cough and sleep. Follow-up as needed.      Relevant Medications   promethazine-codeine (PHENERGAN WITH CODEINE) 6.25-10 MG/5ML syrup       I am having Ms. Betterton start on promethazine-codeine. I am also having her maintain her multivitamin with minerals and thyroid.   Meds ordered this encounter  Medications  . promethazine-codeine (PHENERGAN WITH CODEINE) 6.25-10 MG/5ML syrup    Sig: Take 5 mLs by mouth every 6 (six) hours as needed.    Dispense:  180 mL    Refill:  0    Order Specific  Question:   Supervising Provider    Answer:   Pricilla Holm A [id number]     Follow-up: Return if symptoms worsen or fail to improve.  Mauricio Po, FNP

## 2016-09-14 ENCOUNTER — Encounter: Payer: Self-pay | Admitting: Family Medicine

## 2016-09-16 ENCOUNTER — Other Ambulatory Visit: Payer: Self-pay | Admitting: Family Medicine

## 2016-09-17 ENCOUNTER — Encounter: Payer: Self-pay | Admitting: Family Medicine

## 2016-09-22 ENCOUNTER — Encounter: Payer: Self-pay | Admitting: Family Medicine

## 2016-10-02 ENCOUNTER — Encounter: Payer: Self-pay | Admitting: Family Medicine

## 2016-11-13 ENCOUNTER — Other Ambulatory Visit: Payer: Self-pay | Admitting: Family Medicine

## 2017-02-16 DIAGNOSIS — F320 Major depressive disorder, single episode, mild: Secondary | ICD-10-CM | POA: Diagnosis not present

## 2017-03-02 DIAGNOSIS — F320 Major depressive disorder, single episode, mild: Secondary | ICD-10-CM | POA: Diagnosis not present

## 2017-03-31 DIAGNOSIS — F320 Major depressive disorder, single episode, mild: Secondary | ICD-10-CM | POA: Diagnosis not present

## 2017-04-06 NOTE — Progress Notes (Signed)
[place] at [facility] [address] [place], [place] [address] [phone]  Date:  04/08/2017   Name:  [name]   DOB:  01-24-1971   MRN:  [id number]  PCP:  Darreld Mclean, MD    Chief Complaint: Ear Fullness and Nasal Congestion (Pt states that about 4 weeks ago she started having cold/flu symtoms. as they cleared up she began to have fullness in her ear. States that its gotten worse. )   History of Present Illness:  [name] is a 46 y.o. very pleasant female patient who presents with the following:  Here today with concern about her ear. About a month ago she seemed to come down with a cold- did not seem like a big deal, but when the other sx went away her left ear started to feel full, and her hearing is decreased.  It feels like there is water in her ear.  She has not been able to get her ear to clear with yawning, etc but it did pop some this am  She does have some dry cough, sneezing and sinus pressure No fever No GI symptoms  Lab Results  Component Value Date   TSH 2.41 05/25/2016   Flu shot: will be done at her job   Patient Active Problem List   Diagnosis Date Noted  . Acute upper respiratory infection 09/08/2016  . Obesity, unspecified 07/07/2013  . Hypothyroidism 09/27/2011    Past Medical History:  Diagnosis Date  . Anxiety   . Benign ovarian tumor July 2012   17 pound benign tumor- removed  . Depression   . Thyroid disease     Past Surgical History:  Procedure Laterality Date  . ABDOMINAL HYSTERECTOMY  2012   one ovary remains- due to giant benign ovarian cyst (17 lbs)  . OVARIAN CYST REMOVAL      Social History  Substance Use Topics  . Smoking status: Never Smoker  . Smokeless tobacco: Never Used  . Alcohol use No     Comment: socially    Family History  Problem Relation Age of Onset  . Heart disease Father   . Stroke Father   . Heart attack Father   . Hyperlipidemia  Father   . Hypertension Father   . Heart disease Paternal Grandfather   . Heart attack Paternal Grandfather   . COPD Mother   . Congestive Heart Failure Mother   . Gallbladder disease Maternal Grandmother   . Stroke Paternal Grandmother     Allergies  Allergen Reactions  . Levothyroxine Sodium Anxiety and Palpitations    Medication list has been reviewed and updated.  Current Outpatient Prescriptions on File Prior to Visit  Medication Sig Dispense Refill  . ARMOUR THYROID 30 MG tablet take 2 and 1/2 tablets by mouth once daily BEFORE BREAKFAST 225 tablet 0  . Multiple Vitamins-Minerals (MULTIVITAMIN WITH MINERALS) tablet Take 1 tablet by mouth daily. Reported on 07/24/2015     No current facility-administered medications on file prior to visit.     Review of Systems:  As per HPI- otherwise negative. Dry cough No fever or chills   Physical Examination: Vitals:   04/08/17 1030  BP: 139/82  Pulse: 78  Temp: 98.1 F (36.7 C)  SpO2: 100%   Vitals:   04/08/17 1030  Weight: 268 lb (121.6 kg)  Height: 5\' 4"  (1.626 m)   Body mass index is 46 kg/m. Ideal Body Weight: Weight in (  lb) to have BMI = 25: 145.3  GEN: WDWN, NAD, Non-toxic, A & O x 3, obese, otherwise looks well HEENT: Atraumatic, Normocephalic. Neck supple. No masses, No LAD.  PEERL, oropharynx wnl Left TM is obscured with wax, right is normal Left ear irrigated and wax cleared- normal ear exam then She does have nasal cavity congestion and inflammation  Ears and Nose: No external deformity. CV: RRR, No M/G/R. No JVD. No thrill. No extra heart sounds. PULM: CTA B, no wheezes, crackles, rhonchi. No retractions. No resp. distress. No accessory muscle use. EXTR: No c/c/e NEURO Normal gait.  PSYCH: Normally interactive. Conversant. Not depressed or anxious appearing.  Calm demeanor.  onychomycosis of left pinky toenail   Assessment and Plan: Otalgia, unspecified laterality  Hypothyroidism due to acquired  atrophy of thyroid  Impacted cerumen of left ear  Onychomycosis  Here today with left ear fullness and pressure- resolved cerumen impaction and sx improved She is having some sneezing and sinus pressure- gave samples of xyzal and nasocort to her to use as needed  Only her left pinky toenail is effected by fungus. Discussed options such as lamisil or having the nail removed.  For the time being she prefers to observe, will have her dermatologist look at it for her at her upcoming skin check  Signed Lamar Blinks, MD

## 2017-04-08 ENCOUNTER — Encounter: Payer: Self-pay | Admitting: Family Medicine

## 2017-04-08 ENCOUNTER — Ambulatory Visit (INDEPENDENT_AMBULATORY_CARE_PROVIDER_SITE_OTHER): Payer: BLUE CROSS/BLUE SHIELD | Admitting: Family Medicine

## 2017-04-08 VITALS — BP 139/82 | HR 78 | Temp 98.1°F | Ht 64.0 in | Wt 268.0 lb

## 2017-04-08 DIAGNOSIS — H6122 Impacted cerumen, left ear: Secondary | ICD-10-CM | POA: Diagnosis not present

## 2017-04-08 DIAGNOSIS — H9209 Otalgia, unspecified ear: Secondary | ICD-10-CM

## 2017-04-08 DIAGNOSIS — B351 Tinea unguium: Secondary | ICD-10-CM

## 2017-05-12 DIAGNOSIS — F320 Major depressive disorder, single episode, mild: Secondary | ICD-10-CM | POA: Diagnosis not present

## 2017-05-27 DIAGNOSIS — Z23 Encounter for immunization: Secondary | ICD-10-CM | POA: Diagnosis not present

## 2017-06-05 NOTE — Progress Notes (Addendum)
[facility] [place], [place], [place] [address] [phone]  Date:  06/07/2017   Name:  [name]   DOB:  [birth year]   MRN:  [id number]  PCP:  Darreld Mclean, MD    Chief Complaint: Annual Exam (Pt reports getting light headed sometime )   History of Present Illness:  [name] is a 46 y.o. very pleasant female patient who presents with the following:  Here today for a CPE History of obesity, hypothyroidism Flu: done Tetanus: UTD Mammo: 1/18- she is getting this annually Labs: a year ago She did see her GYN and had a pap in March They are not doing any [place], etc for follow-up of her large uterine tumor- this was benign   She feels that she is sleeping ok- she thought about a CPAP but does not want She feels pretty good in the am   TSH is due today She has a treadmill at home and uses it 3x a week- she had tried a more intensive program but it hurt her joints Her parents are not in great health, she is having to do more with them lately and they are not local She has been under more stress recently   She did eat this am- she had [place] bacon and tomato sandwich BP Readings from Last 3 Encounters:  06/07/17 (!) 146/84  04/08/17 139/82  09/08/16 124/80   Her dad has HTN Lab Results  Component Value Date   TSH 2.41 05/25/2016    Patient Active Problem List   Diagnosis Date Noted  . Acute upper respiratory infection 09/08/2016  . Obesity, unspecified 07/07/2013  . Hypothyroidism 09/27/2011    Past Medical History:  Diagnosis Date  . Anxiety   . Benign ovarian tumor July 2012   17 pound benign tumor- removed  . Depression   . Thyroid disease     Past Surgical History:  Procedure Laterality Date  . ABDOMINAL HYSTERECTOMY  2012   one ovary remains- due to giant benign ovarian cyst (17 lbs)  . OVARIAN CYST REMOVAL      Social History   Tobacco Use  . Smoking status: Never  Smoker  . Smokeless tobacco: Never Used  Substance Use Topics  . Alcohol use: No    Alcohol/week: 0.0 oz    Comment: socially  . Drug use: No    Family History  Problem Relation Age of Onset  . Heart disease Father   . Stroke Father   . Heart attack Father   . Hyperlipidemia Father   . Hypertension Father   . Heart disease Paternal Grandfather   . Heart attack Paternal Grandfather   . COPD Mother   . Congestive Heart Failure Mother   . Gallbladder disease Maternal Grandmother   . Stroke Paternal Grandmother     Allergies  Allergen Reactions  . Levothyroxine Sodium Anxiety and Palpitations    Medication list has been reviewed and updated.  Current Outpatient Medications on File Prior to Visit  Medication Sig Dispense Refill  . ARMOUR THYROID 30 MG tablet take 2 and 1/2 tablets by mouth once daily BEFORE BREAKFAST 225 tablet 0  . Multiple Vitamins-Minerals (MULTIVITAMIN WITH MINERALS) tablet Take 1 tablet by mouth daily. Reported on 07/24/2015     No current facility-administered medications on file prior to visit.     Review of Systems:  As per HPI- otherwise negative.   Physical Examination: Vitals:  06/07/17 1301  BP: (!) 146/84  Pulse: 71  Resp: 16  Temp: 98.1 F (36.7 C)  SpO2: 100%   Vitals:   06/07/17 1301  Weight: 269 lb 3.2 oz (122.1 kg)   Body mass index is 46.21 kg/m. Ideal Body Weight:    GEN: WDWN, NAD, Non-toxic, A & O x 3, obese, looks well HEENT: Atraumatic, Normocephalic. Neck supple. No masses, No LAD.  Bilateral TM wnl, oropharynx normal.  PEERL,EOMI.   Ears and Nose: No external deformity. CV: RRR, No M/G/R. No JVD. No thrill. No extra heart sounds. PULM: CTA B, no wheezes, crackles, rhonchi. No retractions. No resp. distress. No accessory muscle use. ABD: S, NT, ND, +BS. No rebound. No HSM. EXTR: No c/c/e NEURO Normal gait.  PSYCH: Normally interactive. Conversant. Not depressed or anxious appearing.  Calm demeanor.     Assessment and Plan: Physical exam  Screening for deficiency anemia - Plan: CBC  Screening for hyperlipidemia - Plan: Lipid panel  Screening for diabetes mellitus - Plan: Comprehensive metabolic panel, Hemoglobin A1c  Obesity without serious comorbidity, unspecified classification, unspecified obesity type  Acquired hypothyroidism - Plan: TSH, thyroid (ARMOUR THYROID) 30 MG tablet  Vitamin D deficiency - Plan: Vitamin D, 25-hydroxy  Here today for a CPE Labs pending as above Refilled her thyroid medication Discussed stressors with aging parents who are not local Encouraged continued exercise  Signed Lamar Blinks, MD Received her labs 11/6 Results for orders placed or performed in visit on 06/07/17  CBC  Result Value Ref Range   WBC 10.2 4.0 - 10.5 K/uL   RBC 4.30 3.87 - 5.11 Mil/uL   Platelets 332.0 150.0 - 400.0 K/uL   Hemoglobin 13.6 12.0 - 15.0 g/dL   HCT 40.1 36.0 - 46.0 %   MCV 93.4 78.0 - 100.0 fl   MCHC 33.8 30.0 - 36.0 g/dL   RDW 12.7 11.5 - 15.5 %  Comprehensive metabolic panel  Result Value Ref Range   Sodium 138 135 - 145 mEq/L   Potassium 3.7 3.5 - 5.1 mEq/L   Chloride 103 96 - 112 mEq/L   CO2 27 19 - 32 mEq/L   Glucose, Bld 78 70 - 99 mg/dL   BUN 12 6 - 23 mg/dL   Creatinine, Ser 0.66 0.40 - 1.20 mg/dL   Total Bilirubin 0.4 0.2 - 1.2 mg/dL   Alkaline Phosphatase 60 39 - 117 U/L   AST 16 0 - 37 U/L   ALT 17 0 - 35 U/L   Total Protein 7.2 6.0 - 8.3 g/dL   Albumin 4.1 3.5 - 5.2 g/dL   Calcium 9.2 8.4 - 10.5 mg/dL   GFR 102.06 >60.00 mL/min  Lipid panel  Result Value Ref Range   Cholesterol 177 0 - 200 mg/dL   Triglycerides 127.0 0.0 - 149.0 mg/dL   HDL 38.60 (L) >39.00 mg/dL   VLDL 25.4 0.0 - 40.0 mg/dL   LDL Cholesterol 113 (H) 0 - 99 mg/dL   Total CHOL/HDL Ratio 5    NonHDL 138.79   TSH  Result Value Ref Range   TSH 4.34 0.35 - 4.50 uIU/mL  Hemoglobin A1c  Result Value Ref Range   Hgb A1c MFr Bld 5.9 4.6 - 6.5 %  Vitamin D,  25-hydroxy  Result Value Ref Range   VITD 14.89 (L) 30.00 - 100.00 ng/mL   Message to pt

## 2017-06-07 ENCOUNTER — Encounter: Payer: Self-pay | Admitting: Family Medicine

## 2017-06-07 ENCOUNTER — Ambulatory Visit (INDEPENDENT_AMBULATORY_CARE_PROVIDER_SITE_OTHER): Payer: BLUE CROSS/BLUE SHIELD | Admitting: Family Medicine

## 2017-06-07 VITALS — BP 118/90 | HR 71 | Temp 98.1°F | Resp 16 | Wt 269.2 lb

## 2017-06-07 DIAGNOSIS — E039 Hypothyroidism, unspecified: Secondary | ICD-10-CM | POA: Diagnosis not present

## 2017-06-07 DIAGNOSIS — E669 Obesity, unspecified: Secondary | ICD-10-CM | POA: Diagnosis not present

## 2017-06-07 DIAGNOSIS — Z131 Encounter for screening for diabetes mellitus: Secondary | ICD-10-CM | POA: Diagnosis not present

## 2017-06-07 DIAGNOSIS — Z0000 Encounter for general adult medical examination without abnormal findings: Secondary | ICD-10-CM

## 2017-06-07 DIAGNOSIS — E559 Vitamin D deficiency, unspecified: Secondary | ICD-10-CM

## 2017-06-07 DIAGNOSIS — Z130 Encounter for screening for diseases of the blood and blood-forming organs and certain disorders involving the immune mechanism: Secondary | ICD-10-CM

## 2017-06-07 DIAGNOSIS — Z13220 Encounter for screening for lipoid disorders: Secondary | ICD-10-CM

## 2017-06-07 LAB — COMPREHENSIVE METABOLIC PANEL
ALT: 17 U/L (ref 0–35)
AST: 16 U/L (ref 0–37)
Albumin: 4.1 g/dL (ref 3.5–5.2)
Alkaline Phosphatase: 60 U/L (ref 39–117)
BUN: 12 mg/dL (ref 6–23)
CO2: 27 mEq/L (ref 19–32)
Calcium: 9.2 mg/dL (ref 8.4–10.5)
Chloride: 103 mEq/L (ref 96–112)
Creatinine, Ser: 0.66 mg/dL (ref 0.40–1.20)
GFR: 102.06 mL/min (ref >60.00)
Glucose, Bld: 78 mg/dL (ref 70–99)
Potassium: 3.7 mEq/L (ref 3.5–5.1)
Sodium: 138 mEq/L (ref 135–145)
Total Bilirubin: 0.4 mg/dL (ref 0.2–1.2)
Total Protein: 7.2 g/dL (ref 6.0–8.3)

## 2017-06-07 LAB — LIPID PANEL
Cholesterol: 177 mg/dL (ref 0–200)
HDL: 38.6 mg/dL — ABNORMAL LOW (ref >39.00)
LDL Cholesterol: 113 mg/dL — ABNORMAL HIGH (ref 0–99)
NonHDL: 138.79
Total CHOL/HDL Ratio: 5
Triglycerides: 127 mg/dL (ref 0.0–149.0)
VLDL: 25.4 mg/dL (ref 0.0–40.0)

## 2017-06-07 LAB — CBC
HCT: 40.1 % (ref 36.0–46.0)
Hemoglobin: 13.6 g/dL (ref 12.0–15.0)
MCHC: 33.8 g/dL (ref 30.0–36.0)
MCV: 93.4 fl (ref 78.0–100.0)
Platelets: 332 10*3/uL (ref 150.0–400.0)
RBC: 4.3 Mil/uL (ref 3.87–5.11)
RDW: 12.7 % (ref 11.5–15.5)
WBC: 10.2 10*3/uL (ref 4.0–10.5)

## 2017-06-07 LAB — HEMOGLOBIN A1C: Hgb A1c MFr Bld: 5.9 % (ref 4.6–6.5)

## 2017-06-07 LAB — VITAMIN D 25 HYDROXY (VIT D DEFICIENCY, FRACTURES): VITD: 14.89 ng/mL — ABNORMAL LOW (ref 30.00–100.00)

## 2017-06-07 LAB — TSH: TSH: 4.34 u[IU]/mL (ref 0.35–4.50)

## 2017-06-07 MED ORDER — THYROID 30 MG PO TABS: ORAL_TABLET | ORAL | 3 refills | Status: DC

## 2017-06-07 NOTE — Patient Instructions (Signed)
It was good to see you today, as always!  Take care and I will be in touch with your labs asap Continue to work on exercise- would love to see you get up to 5 days a week  Best of luck with helping care for your aging parents- this can be a challenge.  Please let me know if your stress is getting out of control  Health Maintenance, Female Adopting a healthy lifestyle and getting preventive care can go a long way to promote health and wellness. Talk with your health care provider about what schedule of regular examinations is right for you. This is a good chance for you to check in with your provider about disease prevention and staying healthy. In between checkups, there are plenty of things you can do on your own. Experts have done a lot of research about which lifestyle changes and preventive measures are most likely to keep you healthy. Ask your health care provider for more information. Weight and diet Eat a healthy diet  Be sure to include plenty of vegetables, fruits, low-fat dairy products, and lean protein.  Do not eat a lot of foods high in solid fats, added sugars, or salt.  Get regular exercise. This is one of the most important things you can do for your health. ? Most adults should exercise for at least 150 minutes each week. The exercise should increase your heart rate and make you sweat (moderate-intensity exercise). ? Most adults should also do strengthening exercises at least twice a week. This is in addition to the moderate-intensity exercise.  Maintain a healthy weight  Body mass index (BMI) is a measurement that can be used to identify possible weight problems. It estimates body fat based on height and weight. Your health care provider can help determine your BMI and help you achieve or maintain a healthy weight.  For females 34 years of age and older: ? A BMI below 18.5 is considered underweight. ? A BMI of 18.5 to 24.9 is normal. ? A BMI of 25 to 29.9 is considered  overweight. ? A BMI of 30 and above is considered obese.  Watch levels of cholesterol and blood lipids  You should start having your blood tested for lipids and cholesterol at 47 years of age, then have this test every 5 years.  You may need to have your cholesterol levels checked more often if: ? Your lipid or cholesterol levels are high. ? You are older than 47 years of age. ? You are at high risk for heart disease.  Cancer screening Lung Cancer  Lung cancer screening is recommended for adults 73-21 years old who are at high risk for lung cancer because of a history of smoking.  A yearly low-dose CT scan of the lungs is recommended for people who: ? Currently smoke. ? Have quit within the past 15 years. ? Have at least a 30-pack-year history of smoking. A pack year is smoking an average of one pack of cigarettes a day for 1 year.  Yearly screening should continue until it has been 15 years since you quit.  Yearly screening should stop if you develop a health problem that would prevent you from having lung cancer treatment.  Breast Cancer  Practice breast self-awareness. This means understanding how your breasts normally appear and feel.  It also means doing regular breast self-exams. Let your health care provider know about any changes, no matter how small.  If you are in your 20s or 30s, you  should have a clinical breast exam (CBE) by a health care provider every 1-3 years as part of a regular health exam.  If you are 22 or older, have a CBE every year. Also consider having a breast X-ray (mammogram) every year.  If you have a family history of breast cancer, talk to your health care provider about genetic screening.  If you are at high risk for breast cancer, talk to your health care provider about having an MRI and a mammogram every year.  Breast cancer gene (BRCA) assessment is recommended for women who have family members with BRCA-related cancers. BRCA-related cancers  include: ? Breast. ? Ovarian. ? Tubal. ? Peritoneal cancers.  Results of the assessment will determine the need for genetic counseling and BRCA1 and BRCA2 testing.  Cervical Cancer Your health care provider may recommend that you be screened regularly for cancer of the pelvic organs (ovaries, uterus, and vagina). This screening involves a pelvic examination, including checking for microscopic changes to the surface of your cervix (Pap test). You may be encouraged to have this screening done every 3 years, beginning at age 33.  For women ages 57-65, health care providers may recommend pelvic exams and Pap testing every 3 years, or they may recommend the Pap and pelvic exam, combined with testing for human papilloma virus (HPV), every 5 years. Some types of HPV increase your risk of cervical cancer. Testing for HPV may also be done on women of any age with unclear Pap test results.  Other health care providers may not recommend any screening for nonpregnant women who are considered low risk for pelvic cancer and who do not have symptoms. Ask your health care provider if a screening pelvic exam is right for you.  If you have had past treatment for cervical cancer or a condition that could lead to cancer, you need Pap tests and screening for cancer for at least 20 years after your treatment. If Pap tests have been discontinued, your risk factors (such as having a new sexual partner) need to be reassessed to determine if screening should resume. Some women have medical problems that increase the chance of getting cervical cancer. In these cases, your health care provider may recommend more frequent screening and Pap tests.  Colorectal Cancer  This type of cancer can be detected and often prevented.  Routine colorectal cancer screening usually begins at 47 years of age and continues through 47 years of age.  Your health care provider may recommend screening at an earlier age if you have risk factors  for colon cancer.  Your health care provider may also recommend using home test kits to check for hidden blood in the stool.  A small camera at the end of a tube can be used to examine your colon directly (sigmoidoscopy or colonoscopy). This is done to check for the earliest forms of colorectal cancer.  Routine screening usually begins at age 19.  Direct examination of the colon should be repeated every 5-10 years through 47 years of age. However, you may need to be screened more often if early forms of precancerous polyps or small growths are found.  Skin Cancer  Check your skin from head to toe regularly.  Tell your health care provider about any new moles or changes in moles, especially if there is a change in a mole's shape or color.  Also tell your health care provider if you have a mole that is larger than the size of a pencil eraser.  Always use sunscreen. Apply sunscreen liberally and repeatedly throughout the day.  Protect yourself by wearing long sleeves, pants, a wide-brimmed hat, and sunglasses whenever you are outside.  Heart disease, diabetes, and high blood pressure  High blood pressure causes heart disease and increases the risk of stroke. High blood pressure is more likely to develop in: ? People who have blood pressure in the high end of the normal range (130-139/85-89 mm Hg). ? People who are overweight or obese. ? People who are African American.  If you are 54-75 years of age, have your blood pressure checked every 3-5 years. If you are 36 years of age or older, have your blood pressure checked every year. You should have your blood pressure measured twice-once when you are at a hospital or clinic, and once when you are not at a hospital or clinic. Record the average of the two measurements. To check your blood pressure when you are not at a hospital or clinic, you can use: ? An automated blood pressure machine at a pharmacy. ? A home blood pressure monitor.  If  you are between 36 years and 38 years old, ask your health care provider if you should take aspirin to prevent strokes.  Have regular diabetes screenings. This involves taking a blood sample to check your fasting blood sugar level. ? If you are at a normal weight and have a low risk for diabetes, have this test once every three years after 47 years of age. ? If you are overweight and have a high risk for diabetes, consider being tested at a younger age or more often. Preventing infection Hepatitis B  If you have a higher risk for hepatitis B, you should be screened for this virus. You are considered at high risk for hepatitis B if: ? You were born in a country where hepatitis B is common. Ask your health care provider which countries are considered high risk. ? Your parents were born in a high-risk country, and you have not been immunized against hepatitis B (hepatitis B vaccine). ? You have HIV or AIDS. ? You use needles to inject street drugs. ? You live with someone who has hepatitis B. ? You have had sex with someone who has hepatitis B. ? You get hemodialysis treatment. ? You take certain medicines for conditions, including cancer, organ transplantation, and autoimmune conditions.  Hepatitis C  Blood testing is recommended for: ? Everyone born from [birth year] through 1966. ? Anyone with known risk factors for hepatitis C.  Sexually transmitted infections (STIs)  You should be screened for sexually transmitted infections (STIs) including gonorrhea and chlamydia if: ? You are sexually active and are younger than 47 years of age. ? You are older than 47 years of age and your health care provider tells you that you are at risk for this type of infection. ? Your sexual activity has changed since you were last screened and you are at an increased risk for chlamydia or gonorrhea. Ask your health care provider if you are at risk.  If you do not have HIV, but are at risk, it may be recommended  that you take a prescription medicine daily to prevent HIV infection. This is called pre-exposure prophylaxis (PrEP). You are considered at risk if: ? You are sexually active and do not regularly use condoms or know the HIV status of your partner(s). ? You take drugs by injection. ? You are sexually active with a partner who has HIV.  Talk with  your health care provider about whether you are at high risk of being infected with HIV. If you choose to begin PrEP, you should first be tested for HIV. You should then be tested every 3 months for as long as you are taking PrEP. Pregnancy  If you are premenopausal and you may become pregnant, ask your health care provider about preconception counseling.  If you may become pregnant, take 400 to 800 micrograms (mcg) of folic acid every day.  If you want to prevent pregnancy, talk to your health care provider about birth control (contraception). Osteoporosis and menopause  Osteoporosis is a disease in which the bones lose minerals and strength with aging. This can result in serious bone fractures. Your risk for osteoporosis can be identified using a bone density scan.  If you are 6 years of age or older, or if you are at risk for osteoporosis and fractures, ask your health care provider if you should be screened.  Ask your health care provider whether you should take a calcium or vitamin D supplement to lower your risk for osteoporosis.  Menopause may have certain physical symptoms and risks.  Hormone replacement therapy may reduce some of these symptoms and risks. Talk to your health care provider about whether hormone replacement therapy is right for you. Follow these instructions at home:  Schedule regular health, dental, and eye exams.  Stay current with your immunizations.  Do not use any tobacco products including cigarettes, chewing tobacco, or electronic cigarettes.  If you are pregnant, do not drink alcohol.  If you are  breastfeeding, limit how much and how often you drink alcohol.  Limit alcohol intake to no more than 1 drink per day for nonpregnant women. One drink equals 12 ounces of beer, 5 ounces of wine, or 1 ounces of hard liquor.  Do not use street drugs.  Do not share needles.  Ask your health care provider for help if you need support or information about quitting drugs.  Tell your health care provider if you often feel depressed.  Tell your health care provider if you have ever been abused or do not feel safe at home. This information is not intended to replace advice given to you by your health care provider. Make sure you discuss any questions you have with your health care provider. Document Released: 02/02/2011 Document Revised: 12/26/2015 Document Reviewed: 04/23/2015 Elsevier Interactive Patient Education  [employer].

## 2017-06-08 ENCOUNTER — Encounter: Payer: Self-pay | Admitting: Family Medicine

## 2017-06-08 DIAGNOSIS — F320 Major depressive disorder, single episode, mild: Secondary | ICD-10-CM | POA: Diagnosis not present

## 2017-06-08 MED ORDER — VITAMIN D (ERGOCALCIFEROL) 1.25 MG (50000 UNIT) PO CAPS: 50000.0000 [IU] | ORAL_CAPSULE | ORAL | 0 refills | Status: DC

## 2017-06-08 NOTE — Addendum Note (Signed)
Addended by: Lamar Blinks C on: 06/08/2017 04:08 PM   Modules accepted: Orders

## 2017-06-11 DIAGNOSIS — H5213 Myopia, bilateral: Secondary | ICD-10-CM | POA: Diagnosis not present

## 2017-06-30 ENCOUNTER — Other Ambulatory Visit: Payer: Self-pay | Admitting: Family Medicine

## 2017-06-30 DIAGNOSIS — Z139 Encounter for screening, unspecified: Secondary | ICD-10-CM

## 2017-08-17 ENCOUNTER — Ambulatory Visit
Admission: RE | Admit: 2017-08-17 | Discharge: 2017-08-17 | Disposition: A | Discharge status: Home / Self Care | Payer: BLUE CROSS/BLUE SHIELD | Source: Ambulatory Visit | Attending: Family Medicine | Admitting: Family Medicine

## 2017-08-17 DIAGNOSIS — Z1231 Encounter for screening mammogram for malignant neoplasm of breast: Secondary | ICD-10-CM | POA: Diagnosis not present

## 2017-08-17 DIAGNOSIS — Z139 Encounter for screening, unspecified: Secondary | ICD-10-CM

## 2017-08-18 DIAGNOSIS — F320 Major depressive disorder, single episode, mild: Secondary | ICD-10-CM | POA: Diagnosis not present

## 2017-09-10 DIAGNOSIS — F320 Major depressive disorder, single episode, mild: Secondary | ICD-10-CM | POA: Diagnosis not present

## 2017-09-28 DIAGNOSIS — L821 Other seborrheic keratosis: Secondary | ICD-10-CM | POA: Diagnosis not present

## 2017-09-28 DIAGNOSIS — Z85828 Personal history of other malignant neoplasm of skin: Secondary | ICD-10-CM | POA: Diagnosis not present

## 2017-09-28 DIAGNOSIS — D225 Melanocytic nevi of trunk: Secondary | ICD-10-CM | POA: Diagnosis not present

## 2017-09-28 DIAGNOSIS — D485 Neoplasm of uncertain behavior of skin: Secondary | ICD-10-CM | POA: Diagnosis not present

## 2017-12-22 DIAGNOSIS — F320 Major depressive disorder, single episode, mild: Secondary | ICD-10-CM | POA: Diagnosis not present

## 2017-12-29 ENCOUNTER — Ambulatory Visit: Payer: Self-pay | Admitting: *Deleted

## 2017-12-29 NOTE — Telephone Encounter (Signed)
Pt called with complaints of lower abdominal pain which alternates sides for 1 week; she increased vaginal discharge in her panties; the pt says the discharge is malodorous; she also reports bloating in the upper part of her abdomen which started a week ago; the pt also complains of periodic pressure on the left side of her uterus; recommendations made per nurse triage protocol to include seeing a physician within 24 hours; pt normally sees Dr Janett Billow [place], [place]; she does not have appointments within the pt's time constraints; conference call initiated with pt and Raquel Sarna at [facility] and she confirms the information with th pt;  pt initially states that she is unable to come to an appointment tomorrow afternoon; pt does opt for making appointment 12/30/17 at 1730; will route to office for notification of this upcoming appointment. Reason for Disposition . [1] MILD pain (e.g., does not interfere with normal activities) AND [2] pain comes and goes (cramps) AND [3] present > 48 hours  Answer Assessment - Initial Assessment Questions 1. LOCATION: "Where does it hurt?"      Lower abdomen 2. RADIATION: "Does the pain shoot anywhere else?" (e.g., chest, back)     no 3. ONSET: "When did the pain begin?" (e.g., minutes, hours or days ago)      12/22/17 4. SUDDEN: "Gradual or sudden onset?"     Gradual, dull 5. PATTERN "Does the pain come and go, or is it constant?"    - If constant: "Is it getting better, staying the same, or worsening?"      (Note: Constant means the pain never goes away completely; most serious pain is constant and it progresses)     - If intermittent: "How long does it last?" "Do you have pain now?"     (Note: Intermittent means the pain goes away completely between bouts)     imtermittent 6. SEVERITY: "How bad is the pain?"  (e.g., Scale 1-10; mild, moderate, or severe)   - MILD (1-3): doesn't interfere with normal activities, abdomen soft and not tender to touch    -  MODERATE (4-7): interferes with normal activities or awakens from sleep, tender to touch    - SEVERE (8-10): excruciating pain, doubled over, unable to do any normal activities      mild 7. RECURRENT SYMPTOM: "Have you ever had this type of abdominal pain before?" If so, ask: "When was the last time?" and "What happened that time?"      Similar to when she had an ovarian cyst 8. CAUSE: "What do you think is causing the abdominal pain?"     nsure 9. RELIEVING/AGGRAVATING FACTORS: "What makes it better or worse?" (e.g., movement, antacids, bowel movement)     nothing 10. OTHER SYMPTOMS: "Has there been any vomiting, diarrhea, constipation, or urine problems?"       Increased vaginal discharge; bloating, stomach has gotten larger 11. PREGNANCY: "Is there any chance you are pregnant?" "When was your last menstrual period?"       no  Protocols used: ABDOMINAL PAIN - [facility]

## 2017-12-29 NOTE — Telephone Encounter (Signed)
PEC called author regarding pt c/o sharp lower pelvic/abdominal pain, with upper bloating X 1 week. Pt states she has had some vaginal discharge, with slight odor. Pt has hx large ovarian cyst, hx partial hysterectomy. Pt denies fevers, N/V, CP, SOB. Pt denies taking any analgesics for pain. PEC to make an appointment with Dr. Lorelei Pont for 5/30.

## 2017-12-30 ENCOUNTER — Other Ambulatory Visit (HOSPITAL_COMMUNITY)
Admission: RE | Admit: 2017-12-30 | Discharge: 2017-12-30 | Disposition: A | Discharge status: Home / Self Care | Payer: BLUE CROSS/BLUE SHIELD | Source: Ambulatory Visit | Attending: Family Medicine | Admitting: Family Medicine

## 2017-12-30 ENCOUNTER — Ambulatory Visit (INDEPENDENT_AMBULATORY_CARE_PROVIDER_SITE_OTHER): Payer: BLUE CROSS/BLUE SHIELD | Admitting: Family Medicine

## 2017-12-30 ENCOUNTER — Encounter: Payer: Self-pay | Admitting: Family Medicine

## 2017-12-30 VITALS — BP 138/92 | HR 90 | Temp 98.3°F | Resp 18 | Ht 64.0 in | Wt 272.0 lb

## 2017-12-30 DIAGNOSIS — N898 Other specified noninflammatory disorders of vagina: Secondary | ICD-10-CM

## 2017-12-30 DIAGNOSIS — N83209 Unspecified ovarian cyst, unspecified side: Secondary | ICD-10-CM | POA: Diagnosis not present

## 2017-12-30 DIAGNOSIS — R140 Abdominal distension (gaseous): Secondary | ICD-10-CM

## 2017-12-30 DIAGNOSIS — R1013 Epigastric pain: Secondary | ICD-10-CM

## 2017-12-30 DIAGNOSIS — R1030 Lower abdominal pain, unspecified: Secondary | ICD-10-CM | POA: Insufficient documentation

## 2017-12-30 LAB — POCT URINALYSIS DIP (MANUAL ENTRY)
Bilirubin, UA: NEGATIVE
Blood, UA: NEGATIVE
Glucose, UA: NEGATIVE mg/dL
Ketones, POC UA: NEGATIVE mg/dL
Leukocytes, UA: NEGATIVE
Nitrite, UA: NEGATIVE
Spec Grav, UA: >=1.03 — AB (ref 1.010–1.025)
Urobilinogen, UA: 0.2 E.U./dL
pH, UA: 6 (ref 5.0–8.0)

## 2017-12-30 MED ORDER — GI COCKTAIL ~~LOC~~: 30.0000 mL | Freq: Once | ORAL | Status: DC

## 2017-12-30 NOTE — Progress Notes (Addendum)
[place] [facility] [place], [place], [place] [address] [phone]  Date:  12/30/2017   Name:  [name]   DOB:  1970-08-14   MRN:  [id number]  PCP:  Darreld Mclean, MD    Chief Complaint: Abdominal Pain (1 week, lower abdominal pain, bloating, vaginal discharge, urinary frequency)   History of Present Illness:  [name] is a 47 y.o. very pleasant female patient who presents with the following:  Here today with concern of abd symptoms- she had called in yesterday as below:   Pt called with complaints of lower abdominal pain which alternates sides for 1 week; she increased vaginal discharge in her panties; the pt says the discharge is malodorous; she also reports bloating in the upper part of her abdomen which started a week ago; the pt also complains of periodic pressure on the left side of her uterus; recommendations made per nurse triage protocol to include seeing a physician within 24 hours; pt normally sees Dr Lamar Blinks, [place]; she does not have appointments within the pt's time constraints; conference call initiated with pt and Raquel Sarna at [facility] and she confirms the information with th pt;  pt initially states that she is unable to come to an appointment tomorrow afternoon; pt does opt for making appointment 12/30/17 at 1730; will route to office for notification of this upcoming appointment.  Pt does have history of a very large ovarian mass and had a partial hyst in 2012 at [facility]- her left ovary remains Her cyst was on the RIGHT side   Pt notes dull and sometimes piercing pain in her lower abd- can be on the left or the right   She has noted more vaginal discharge than is normal for her- it is clear, not itchy  She also feels like her belly is enlarging again like it did back in 2012 when she had the cyst She feels like her upper abd is tender    Patient Active Problem List   Diagnosis Date Noted  . Acute upper respiratory infection 09/08/2016  . Obesity, unspecified 07/07/2013  . Hypothyroidism 09/27/2011    Past Medical History:  Diagnosis Date  . Anxiety   . Benign ovarian tumor July 2012   17 pound benign tumor- removed  . Depression   . Thyroid disease     Past Surgical History:  Procedure Laterality Date  . ABDOMINAL HYSTERECTOMY  2012   one ovary remains- due to giant benign ovarian cyst (17 lbs)  . OVARIAN CYST REMOVAL      Social History   Tobacco Use  . Smoking status: Never Smoker  . Smokeless tobacco: Never Used  Substance Use Topics  . Alcohol use: No    Alcohol/week: 0.0 oz    Comment: socially  . Drug use: No    Family History  Problem Relation Age of Onset  . Heart disease Father   . Stroke Father   . Heart attack Father   . Hyperlipidemia Father   . Hypertension Father   . Heart disease Paternal Grandfather   . Heart attack Paternal Grandfather   . COPD Mother   . Congestive Heart Failure Mother   . Gallbladder disease Maternal Grandmother   . Stroke Paternal Grandmother     Allergies  Allergen Reactions  . Levothyroxine Sodium Anxiety and Palpitations    Medication list has been reviewed and updated.  Current Outpatient Medications on File  Prior to Visit  Medication Sig Dispense Refill  . Melatonin Gummies 2.5 MG CHEW     . Multiple Vitamins-Minerals (MULTIVITAMIN WITH MINERALS) tablet Take 1 tablet by mouth daily. Reported on 07/24/2015    . thyroid (ARMOUR THYROID) 30 MG tablet take 2 and 1/2 tablets by mouth once daily BEFORE BREAKFAST 225 tablet 3   No current facility-administered medications on file prior to visit.     Review of Systems:  As per HPI- otherwise negative.   Physical Examination: Vitals:   12/30/17 1727  BP: (!) 138/92  Pulse: 90  Resp: 18  Temp: 98.3 F (36.8 C)  SpO2: 99%   Vitals:   12/30/17 1727  Weight: 272 lb (123.4 kg)  Height: 5\' 4"  (1.626 m)   Body mass  index is 46.69 kg/m. Ideal Body Weight: Weight in (lb) to have BMI = 25: 145.3  GEN: WDWN, NAD, Non-toxic, A & O x 3, obese, otherwise looks well  HEENT: Atraumatic, Normocephalic. Neck supple. No masses, No LAD. Ears and Nose: No external deformity. CV: RRR, No M/G/R. No JVD. No thrill. No extra heart sounds. PULM: CTA B, no wheezes, crackles, rhonchi. No retractions. No resp. distress. No accessory muscle use. ABD: S, ND, +BS. No rebound. No HSM. Epigastric tenderness is present today EXTR: No c/c/e NEURO Normal gait.  PSYCH: Normally interactive. Conversant. Not depressed or anxious appearing.  Calm demeanor.  Pelvic: normal external anatomy, no vaginal lesions or discharge. Cervix and uterus surgically absent. No masses palpated in adnexa   Given a GI cocktail which did not change her epigastric tenderness  Results for orders placed or performed in visit on 12/30/17  POCT urinalysis dipstick  Result Value Ref Range   Color, UA yellow yellow   Clarity, UA clear clear   Glucose, UA negative negative mg/dL   Bilirubin, UA negative negative   Ketones, POC UA negative negative mg/dL   Spec Grav, UA >=1.030 (A) 1.010 - 1.025   Blood, UA negative negative   pH, UA 6.0 5.0 - 8.0   Protein Ur, POC trace (A) negative mg/dL   Urobilinogen, UA 0.2 0.2 or 1.0 E.U./dL   Nitrite, UA Negative Negative   Leukocytes, UA Negative Negative    Assessment and Plan: Epigastric pain - Plan: gi cocktail (Maalox,Lidocaine,Donnatal), Basic metabolic panel, CBC, CT Abdomen Pelvis W Contrast  Lower abdominal pain - Plan: POCT urinalysis dipstick, Urine Culture, Cervicovaginal ancillary only, CT Abdomen Pelvis W Contrast  Vaginal discharge - Plan: Cervicovaginal ancillary only  Cyst of ovary, unspecified laterality - Plan: CT Abdomen Pelvis W Contrast  Abdominal distension (gaseous) - Plan: CT Abdomen Pelvis W Contrast  History of watermelon sized right ovarian cyst. Removed with partial hyst in  2012 She now notes sx that remind her of that time, with lower abd discomfort, bloating of her abdomen and heartburn Labs pending as above Will obtain CT scan of her abd/ pelvis  Signed Lamar Blinks, MD  Received her labs 5/31- message to pt Results for orders placed or performed in visit on 62/37/62  Basic metabolic panel  Result Value Ref Range   Sodium 138 135 - 145 mEq/L   Potassium 3.7 3.5 - 5.1 mEq/L   Chloride 102 96 - 112 mEq/L   CO2 27 19 - 32 mEq/L   Glucose, Bld 86 70 - 99 mg/dL   BUN 20 6 - 23 mg/dL   Creatinine, Ser 0.75 0.40 - 1.20 mg/dL   Calcium 9.3 8.4 - 10.5 mg/dL  GFR 87.85 >60.00 mL/min  CBC  Result Value Ref Range   WBC 10.0 4.0 - 10.5 K/uL   RBC 4.26 3.87 - 5.11 Mil/uL   Platelets 350.0 150.0 - 400.0 K/uL   Hemoglobin 13.5 12.0 - 15.0 g/dL   HCT 39.2 36.0 - 46.0 %   MCV 92.0 78.0 - 100.0 fl   MCHC 34.6 30.0 - 36.0 g/dL   RDW 12.7 11.5 - 15.5 %  POCT urinalysis dipstick  Result Value Ref Range   Color, UA yellow yellow   Clarity, UA clear clear   Glucose, UA negative negative mg/dL   Bilirubin, UA negative negative   Ketones, POC UA negative negative mg/dL   Spec Grav, UA >=1.030 (A) 1.010 - 1.025   Blood, UA negative negative   pH, UA 6.0 5.0 - 8.0   Protein Ur, POC trace (A) negative mg/dL   Urobilinogen, UA 0.2 0.2 or 1.0 E.U./dL   Nitrite, UA Negative Negative   Leukocytes, UA Negative Negative

## 2017-12-30 NOTE — Patient Instructions (Signed)
We will get some lab info for you today, and I will order a CT scan to further evaluate your abdominal symptoms Please let me know if you are getting worse or have any other concerns in the meantime

## 2017-12-31 ENCOUNTER — Encounter: Payer: Self-pay | Admitting: Family Medicine

## 2017-12-31 LAB — BASIC METABOLIC PANEL
BUN: 20 mg/dL (ref 6–23)
CO2: 27 mEq/L (ref 19–32)
Calcium: 9.3 mg/dL (ref 8.4–10.5)
Chloride: 102 mEq/L (ref 96–112)
Creatinine, Ser: 0.75 mg/dL (ref 0.40–1.20)
GFR: 87.85 mL/min (ref >60.00)
Glucose, Bld: 86 mg/dL (ref 70–99)
Potassium: 3.7 mEq/L (ref 3.5–5.1)
Sodium: 138 mEq/L (ref 135–145)

## 2017-12-31 LAB — CBC
HCT: 39.2 % (ref 36.0–46.0)
Hemoglobin: 13.5 g/dL (ref 12.0–15.0)
MCHC: 34.6 g/dL (ref 30.0–36.0)
MCV: 92 fl (ref 78.0–100.0)
Platelets: 350 10*3/uL (ref 150.0–400.0)
RBC: 4.26 Mil/uL (ref 3.87–5.11)
RDW: 12.7 % (ref 11.5–15.5)
WBC: 10 10*3/uL (ref 4.0–10.5)

## 2018-01-01 LAB — URINE CULTURE
MICRO NUMBER:: 90657672
SPECIMEN QUALITY:: ADEQUATE

## 2018-01-03 LAB — CERVICOVAGINAL ANCILLARY ONLY
Bacterial vaginitis: NEGATIVE
Candida vaginitis: NEGATIVE

## 2018-01-05 ENCOUNTER — Telehealth: Payer: Self-pay | Admitting: Family Medicine

## 2018-01-05 ENCOUNTER — Ambulatory Visit
Admission: RE | Admit: 2018-01-05 | Discharge: 2018-01-05 | Disposition: A | Discharge status: Home / Self Care | Payer: BLUE CROSS/BLUE SHIELD | Source: Ambulatory Visit | Attending: Family Medicine | Admitting: Family Medicine

## 2018-01-05 DIAGNOSIS — R1030 Lower abdominal pain, unspecified: Secondary | ICD-10-CM

## 2018-01-05 DIAGNOSIS — R1013 Epigastric pain: Secondary | ICD-10-CM

## 2018-01-05 DIAGNOSIS — N838 Other noninflammatory disorders of ovary, fallopian tube and broad ligament: Secondary | ICD-10-CM

## 2018-01-05 DIAGNOSIS — R140 Abdominal distension (gaseous): Secondary | ICD-10-CM

## 2018-01-05 DIAGNOSIS — N83209 Unspecified ovarian cyst, unspecified side: Secondary | ICD-10-CM

## 2018-01-05 DIAGNOSIS — K635 Polyp of colon: Secondary | ICD-10-CM

## 2018-01-05 MED ORDER — IOPAMIDOL (ISOVUE-300) INJECTION 61%
125.0000 mL | Freq: Once | INTRAVENOUS | Status: AC | PRN
  Administered 2018-01-05: 125 mL via INTRAVENOUS

## 2018-01-05 NOTE — Telephone Encounter (Signed)
Called pt to go over her CT scan as follows;  Ct Abdomen Pelvis W Contrast  Result Date: 01/05/2018 CLINICAL DATA:  Epigastric pain and bloating. Lower abdominal pain for 2 months. Prior hysterectomy and unilateral oophorectomy. EXAM: CT ABDOMEN AND PELVIS WITH CONTRAST TECHNIQUE: Multidetector CT imaging of the abdomen and pelvis was performed using the standard protocol following bolus administration of intravenous contrast. CONTRAST:  158mL ISOVUE-300 IOPAMIDOL (ISOVUE-300) INJECTION 61% COMPARISON:  12/12/2010 FINDINGS: Lower chest: Unremarkable. Hepatobiliary: No focal abnormality within the liver parenchyma. There is no evidence for gallstones, gallbladder wall thickening, or pericholecystic fluid. No intrahepatic or extrahepatic biliary dilation. Pancreas: No focal mass lesion. No dilatation of the main duct. No intraparenchymal cyst. No peripancreatic edema. Spleen: No splenomegaly. No focal mass lesion. Adrenals/Urinary Tract: No adrenal nodule or mass. Tiny low-density lesion identified in each kidney, too small to characterize but likely benign and probably cysts. No evidence for urinary stone disease. No evidence for hydroureter. The urinary bladder appears normal for the degree of distention. Stomach/Bowel: Stomach is nondistended. No gastric wall thickening. No evidence of outlet obstruction. Duodenum is normally positioned as is the ligament of Treitz. No small bowel wall thickening. No small bowel dilatation. The terminal ileum is normal. The appendix is normal. 4.1 cm soft tissue lesion is identified in the cecum, just proximal to the ileocecal valve (image 72/series 2 and coronal image 51/series 3). Colon otherwise unremarkable. Vascular/Lymphatic: No abdominal aortic aneurysm. No abdominal aortic atherosclerotic calcification. There is no gastrohepatic or hepatoduodenal ligament lymphadenopathy. No intraperitoneal or retroperitoneal lymphadenopathy. No pelvic sidewall lymphadenopathy.  Reproductive: Uterus is surgically absent. 10.2 x 6.8 x 8.2 cm multicystic lesion is identified in the left adnexal space, just cranial to the bladder. The left gonadal vein tracks into a soft tissue component along the left margin of this lesion, consistent with left ovarian origin. The multiple loculated cystic components show differential attenuation. Other: No intraperitoneal free fluid. Musculoskeletal: Insert bone windows IMPRESSION: 1. 4.1 cm soft tissue attenuating lesion in the cecum, concerning for polyp/neoplasm. GI consultation and colonoscopy suggested. 2. 10.2 cm multicystic left ovarian mass with varying attenuation within the different cystic components. Although no overt mural or papillary nodularity evident, some of the septae a appears ill-defined. 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 Ultimately, MRI of the pelvis without and with contrast may be warranted. These results will be called to the ordering clinician or representative by the Radiologist Assistant, and communication documented in the PACS or zVision Dashboard. Electronically Signed   By: Misty Stanley M.D.   On: 01/05/2018 13:23   Will refer to GI as she needs colonoscopy and removal of 4cm polyp in colon She was treated at [facility] for her right ovarian cyst, and would like to go back to them. Her main doc was Dr. Alger Memos- [facility]. I will refer her back to see him again and will also order a pelvic US as recommended

## 2018-01-06 ENCOUNTER — Encounter: Payer: Self-pay | Admitting: Gastroenterology

## 2018-01-10 ENCOUNTER — Other Ambulatory Visit: Payer: Self-pay | Admitting: Family Medicine

## 2018-01-10 ENCOUNTER — Encounter: Payer: Self-pay | Admitting: Family Medicine

## 2018-01-10 ENCOUNTER — Ambulatory Visit (HOSPITAL_BASED_OUTPATIENT_CLINIC_OR_DEPARTMENT_OTHER): Payer: BLUE CROSS/BLUE SHIELD

## 2018-01-10 ENCOUNTER — Ambulatory Visit (HOSPITAL_BASED_OUTPATIENT_CLINIC_OR_DEPARTMENT_OTHER)
Admission: RE | Admit: 2018-01-10 | Discharge: 2018-01-10 | Disposition: A | Discharge status: Home / Self Care | Payer: BLUE CROSS/BLUE SHIELD | Source: Ambulatory Visit | Attending: Family Medicine | Admitting: Family Medicine

## 2018-01-10 DIAGNOSIS — N83202 Unspecified ovarian cyst, left side: Secondary | ICD-10-CM | POA: Diagnosis not present

## 2018-01-10 DIAGNOSIS — R1900 Intra-abdominal and pelvic swelling, mass and lump, unspecified site: Secondary | ICD-10-CM

## 2018-01-10 DIAGNOSIS — N838 Other noninflammatory disorders of ovary, fallopian tube and broad ligament: Secondary | ICD-10-CM

## 2018-01-10 DIAGNOSIS — N83292 Other ovarian cyst, left side: Secondary | ICD-10-CM | POA: Diagnosis not present

## 2018-01-11 ENCOUNTER — Telehealth: Payer: Self-pay

## 2018-01-11 NOTE — Telephone Encounter (Signed)
Author received phone call from [facility] radiology to relay malignant results from pelvic transvaginal U/S performed 01/10/18. A L adnexal cycstic mass was found, consistent with neoplasm. A follow up MRI may be indicated per radiology tech, but a surgical consult is recommended. See report for more detail. Findings reported and routed to Dr. Lorelei Pont.

## 2018-01-11 NOTE — Telephone Encounter (Signed)
Dr. Lorelei Pont already aware of results per Mel Almond, CMA. Malignancy not yet determined despite what author heard via phone report. Pt. Scheduled with Dr. Loletta Specter on 6/20 for follow-up.

## 2018-01-12 ENCOUNTER — Encounter: Payer: Self-pay | Admitting: Gastroenterology

## 2018-01-12 ENCOUNTER — Ambulatory Visit (INDEPENDENT_AMBULATORY_CARE_PROVIDER_SITE_OTHER): Payer: BLUE CROSS/BLUE SHIELD | Admitting: Gastroenterology

## 2018-01-12 VITALS — BP 128/82 | HR 76 | Ht 64.0 in | Wt 272.0 lb

## 2018-01-12 DIAGNOSIS — R935 Abnormal findings on diagnostic imaging of other abdominal regions, including retroperitoneum: Secondary | ICD-10-CM

## 2018-01-12 DIAGNOSIS — Z1211 Encounter for screening for malignant neoplasm of colon: Secondary | ICD-10-CM | POA: Diagnosis not present

## 2018-01-12 MED ORDER — SUPREP BOWEL PREP KIT 17.5-3.13-1.6 GM/177ML PO SOLN: ORAL | 0 refills | Status: DC

## 2018-01-12 NOTE — Progress Notes (Signed)
Reviewed and agree with documentation and assessment and plan. K. Veena Feven Alderfer , MD   

## 2018-01-12 NOTE — Progress Notes (Signed)
01/12/2018 Josy Peaden [id number] [birth year]   HISTORY OF PRESENT ILLNESS:  This is a 47 year old female who is new to our office.  She is here today to discuss colonoscopy at the request of her PCP, Dr. Lorelei Pont.  She was recently having some lower abdominal discomfort and previously had a 17 pound benign ovarian tumor removed so she saw her PCP and CT scan of the abdomen and pelvis was performed.  This showed the following:   IMPRESSION: 1. 4.1 cm soft tissue attenuating lesion in the cecum, concerning for polyp/neoplasm. GI consultation and colonoscopy suggested. 2. 10.2 cm multicystic left ovarian mass with varying attenuation within the different cystic components. Although no overt mural or papillary nodularity evident, some of the septae a appears ill-defined. 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 Ultimately, MRI of the pelvis without and with contrast may be warranted.  She actually has appt with the surgeon to discuss the ovarian mass on 6/20 as well.    She has no family history of colon cancer.  No GI symptoms; moves her bowels regularly.  No blood seen in her stool.  Routine labs normal recently.    Past Medical History:  Diagnosis Date  . Anxiety   . Benign ovarian tumor July 2012   17 pound benign tumor- removed  . Depression   . Thyroid disease    Past Surgical History:  Procedure Laterality Date  . ABDOMINAL HYSTERECTOMY  2012   one ovary remains- due to giant benign ovarian cyst (17 lbs)  . OVARIAN CYST REMOVAL      reports that she has never smoked. She has never used smokeless tobacco. She reports that she does not drink alcohol or use drugs. family history includes COPD in her mother; Congestive Heart Failure in her mother; Gallbladder disease in her maternal grandmother; Heart attack in her father and paternal grandfather; Heart disease in her  father and paternal grandfather; Hyperlipidemia in her father; Hypertension in her father; Stroke in her father and paternal grandmother. Allergies  Allergen Reactions  . Levothyroxine Sodium Anxiety and Palpitations      Outpatient Encounter Medications as of 01/12/2018  Medication Sig  . cholecalciferol (VITAMIN D) 1000 units tablet Take 2,000 Units by mouth daily.  . Melatonin Gummies 2.5 MG CHEW   . Multiple Vitamins-Minerals (MULTIVITAMIN WITH MINERALS) tablet Take 1 tablet by mouth daily. Reported on 07/24/2015  . thyroid (ARMOUR THYROID) 30 MG tablet take 2 and 1/2 tablets by mouth once daily BEFORE BREAKFAST  . [DISCONTINUED] gi cocktail (Maalox,Lidocaine,Donnatal)    No facility-administered encounter medications on file as of 01/12/2018.      REVIEW OF SYSTEMS  : All other systems reviewed and negative except where noted in the History of Present Illness.   PHYSICAL EXAM: BP 128/82   Pulse 76   Ht 5\' 4"  (1.626 m)   Wt 272 lb (123.4 kg)   BMI 46.69 kg/m  General: Well developed white female in no acute distress Head: Normocephalic and atraumatic Eyes:  Sclerae anicteric, conjunctiva pink. Ears: Normal auditory acuity Lungs: Clear throughout to auscultation; no increased WOB. Heart: Regular rate and rhythm; no M/R/G. Abdomen: Soft, seems slightly distended.  BS present.  Non-tender. Rectal:  Will be done at the time of colonoscopy. Musculoskeletal: Symmetrical with no gross deformities  Skin: No lesions on visible extremities Extremities: No edema  Neurological: Alert oriented x 4, grossly non-focal  Psychological:  Alert and cooperative. Normal mood and affect  ASSESSMENT AND PLAN: *Abnormal CT scan with large polyp vs neoplasm in cecum:  Needs colonoscopy soon.  Will schedule with Dr. Silverio Decamp.    **The risks, benefits, and alternatives to colonoscopy were discussed with the patient and she consents to proceed. **Of note, she is seeing a Psychologist, sport and exercise on 6/20 for the  ovarian lesion that was also seen.  CC:  Copland, Gay Filler, MD

## 2018-01-12 NOTE — Patient Instructions (Signed)
If you are age 48 or older, your body mass index should be between 23-30. Your Body mass index is 46.69 kg/m. If this is out of the aforementioned range listed, please consider follow up with your Primary Care Provider.  If you are age 66 or younger, your body mass index should be between 19-25. Your Body mass index is 46.69 kg/m. If this is out of the aformentioned range listed, please consider follow up with your Primary Care Provider.   You have been scheduled for a colonoscopy. Please follow written instructions given to you at your visit today.  Please pick up your prep supplies at the pharmacy within the next 1-3 days. If you use inhalers (even only as needed), please bring them with you on the day of your procedure. Your physician has requested that you go to www.startemmi.com and enter the access code given to you at your visit today. This web site gives a general overview about your procedure. However, you should still follow specific instructions given to you by our office regarding your preparation for the procedure.  Thank you.

## 2018-01-13 DIAGNOSIS — R1909 Other intra-abdominal and pelvic swelling, mass and lump: Secondary | ICD-10-CM | POA: Diagnosis not present

## 2018-01-13 DIAGNOSIS — N838 Other noninflammatory disorders of ovary, fallopian tube and broad ligament: Secondary | ICD-10-CM | POA: Diagnosis not present

## 2018-01-20 DIAGNOSIS — N839 Noninflammatory disorder of ovary, fallopian tube and broad ligament, unspecified: Secondary | ICD-10-CM | POA: Diagnosis not present

## 2018-01-20 DIAGNOSIS — E039 Hypothyroidism, unspecified: Secondary | ICD-10-CM | POA: Diagnosis not present

## 2018-01-20 DIAGNOSIS — Z6841 Body Mass Index (BMI) 40.0 and over, adult: Secondary | ICD-10-CM | POA: Diagnosis not present

## 2018-01-20 DIAGNOSIS — R140 Abdominal distension (gaseous): Secondary | ICD-10-CM | POA: Diagnosis not present

## 2018-01-20 DIAGNOSIS — Z79899 Other long term (current) drug therapy: Secondary | ICD-10-CM | POA: Diagnosis not present

## 2018-01-20 DIAGNOSIS — R109 Unspecified abdominal pain: Secondary | ICD-10-CM | POA: Diagnosis not present

## 2018-01-20 DIAGNOSIS — N949 Unspecified condition associated with female genital organs and menstrual cycle: Secondary | ICD-10-CM | POA: Diagnosis not present

## 2018-01-24 ENCOUNTER — Encounter: Payer: Self-pay | Admitting: Gastroenterology

## 2018-01-24 ENCOUNTER — Other Ambulatory Visit: Payer: Self-pay

## 2018-01-24 ENCOUNTER — Ambulatory Visit (AMBULATORY_SURGERY_CENTER): Payer: BLUE CROSS/BLUE SHIELD | Admitting: Gastroenterology

## 2018-01-24 VITALS — BP 128/85 | HR 73 | Temp 98.6°F | Resp 12 | Ht 64.0 in | Wt 272.0 lb

## 2018-01-24 DIAGNOSIS — R933 Abnormal findings on diagnostic imaging of other parts of digestive tract: Secondary | ICD-10-CM | POA: Diagnosis not present

## 2018-01-24 DIAGNOSIS — R935 Abnormal findings on diagnostic imaging of other abdominal regions, including retroperitoneum: Secondary | ICD-10-CM

## 2018-01-24 DIAGNOSIS — D120 Benign neoplasm of cecum: Secondary | ICD-10-CM | POA: Diagnosis not present

## 2018-01-24 DIAGNOSIS — D124 Benign neoplasm of descending colon: Secondary | ICD-10-CM | POA: Diagnosis not present

## 2018-01-24 MED ORDER — SODIUM CHLORIDE 0.9 % IV SOLN: 500.0000 mL | Freq: Once | INTRAVENOUS | Status: DC

## 2018-01-24 NOTE — Progress Notes (Signed)
Report to PACU, RN, vss, BBS= Clear.  

## 2018-01-24 NOTE — Patient Instructions (Signed)
Impression/Recommendations:  Polyp handout given to patient. Hemorrhoid handout given to patient.  Resume previous diet. Continue present medications.   Repeat colonoscopy for surveillance.  Date to be determined after pathology results reviewed.  YOU HAD AN ENDOSCOPIC PROCEDURE TODAY AT [facility]:   Refer to the procedure report that was given to you for any specific questions about what was found during the examination.  If the procedure report does not answer your questions, please call your gastroenterologist to clarify.  If you requested that your care partner not be given the details of your procedure findings, then the procedure report has been included in a sealed envelope for you to review at your convenience later.  YOU SHOULD EXPECT: Some feelings of bloating in the abdomen. Passage of more gas than usual.  Walking can help get rid of the air that was put into your GI tract during the procedure and reduce the bloating. If you had a lower endoscopy (such as a colonoscopy or flexible sigmoidoscopy) you may notice spotting of blood in your stool or on the toilet paper. If you underwent a bowel prep for your procedure, you may not have a normal bowel movement for a few days.  Please Note:  You might notice some irritation and congestion in your nose or some drainage.  This is from the oxygen used during your procedure.  There is no need for concern and it should clear up in a day or so.  SYMPTOMS TO REPORT IMMEDIATELY:   Following lower endoscopy (colonoscopy or flexible sigmoidoscopy):  Excessive amounts of blood in the stool  Significant tenderness or worsening of abdominal pains  Swelling of the abdomen that is new, acute  Fever of 100F or higher For urgent or emergent issues, a gastroenterologist can be reached at any hour by calling [phone].   DIET:  We do recommend a small meal at first, but then you may proceed to your regular diet.  Drink plenty of  fluids but you should avoid alcoholic beverages for 24 hours.  ACTIVITY:  You should plan to take it easy for the rest of today and you should NOT DRIVE or use heavy machinery until tomorrow (because of the sedation medicines used during the test).    FOLLOW UP: Our staff will call the number listed on your records the next business day following your procedure to check on you and address any questions or concerns that you may have regarding the information given to you following your procedure. If we do not reach you, we will leave a message.  However, if you are feeling well and you are not experiencing any problems, there is no need to return our call.  We will assume that you have returned to your regular daily activities without incident.  If any biopsies were taken you will be contacted by phone or by letter within the next 1-3 weeks.  Please call us at [phone] if you have not heard about the biopsies in 3 weeks.    SIGNATURES/CONFIDENTIALITY: You and/or your care partner have signed paperwork which will be entered into your electronic medical record.  These signatures attest to the fact that that the information above on your After Visit Summary has been reviewed and is understood.  Full responsibility of the confidentiality of this discharge information lies with you and/or your care-partner.

## 2018-01-24 NOTE — Progress Notes (Signed)
Pt's states no medical or surgical changes since previsit or office visit. 

## 2018-01-24 NOTE — Op Note (Signed)
[place] Patient Name: [name] Procedure Date: 01/24/2018 9:24 AM MRN: [id number] Endoscopist: Mauri Pole , MD Age: 47 Referring MD:  Date of Birth: 12-08-1970 Gender: Female Account #: [id number] Procedure:                Colonoscopy Indications:              Abnormal CT of the GI tract. Cecal thickening and                            mass lesion Medicines:                Monitored Anesthesia Care Procedure:                Pre-Anesthesia Assessment:                           - Prior to the procedure, a History and Physical                            was performed, and patient medications and                            allergies were reviewed. The patient's tolerance of                            previous anesthesia was also reviewed. The risks                            and benefits of the procedure and the sedation                            options and risks were discussed with the patient.                            All questions were answered, and informed consent                            was obtained. Prior Anticoagulants: The patient has                            taken no previous anticoagulant or antiplatelet                            agents. ASA Grade Assessment: III - A patient with                            severe systemic disease. After reviewing the risks                            and benefits, the patient was deemed in                            satisfactory condition to undergo the procedure.  After obtaining informed consent, the colonoscope                            was passed under direct vision. Throughout the                            procedure, the patient's blood pressure, pulse, and                            oxygen saturations were monitored continuously. The                            Model PCF-[id number]) scope was introduced                            through the anus and advanced to the  the cecum,                            identified by appendiceal orifice and ileocecal                            valve. The colonoscopy was performed without                            difficulty. The patient tolerated the procedure                            well. The quality of the bowel preparation was                            excellent. The ileocecal valve, appendiceal                            orifice, and rectum were photographed. Scope In: 9:28:17 AM Scope Out: 10:59:03 AM Scope Withdrawal Time: 1 hour 14 minutes 45 seconds  Total Procedure Duration: 1 hour 30 minutes 46 seconds  Findings:                 The perianal and digital rectal examinations were                            normal.                           A 2 mm polyp was found in the descending colon. The                            polyp was sessile. The polyp was removed with a                            cold biopsy forceps. Resection and retrieval were                            complete.  A 40 mm polypoid lesion was found in the cecum. The                            lesion was frond-like/villous and multi-lobulated.                            This was biopsied with a cold forceps for histology.                           Non-bleeding internal hemorrhoids were found during                            retroflexion. The hemorrhoids were small. Complications:            No immediate complications. Estimated Blood Loss:     Estimated blood loss was minimal. Impression:               - One 2 mm polyp in the descending colon, removed                            with a cold biopsy forceps. Resected and retrieved.                           - Rule out malignancy, polypoid lesion in the                            cecum. Biopsied.                           - Non-bleeding internal hemorrhoids. Recommendation:           - Patient has a contact number available for                            emergencies.  The signs and symptoms of potential                            delayed complications were discussed with the                            patient. Return to normal activities tomorrow.                            Written discharge instructions were provided to the                            patient.                           - Resume previous diet.                           - Continue present medications.                           - Await pathology results.                           -  Repeat colonoscopy date to be determined after                            pending pathology results are reviewed for                            surveillance based on pathology results. Mauri Pole, MD 01/24/2018 10:05:55 AM This report has been signed electronically.

## 2018-01-24 NOTE — Progress Notes (Signed)
Called to room to assist during endoscopic procedure.  Patient ID and intended procedure confirmed with present staff. Received instructions for my participation in the procedure from the performing physician.   Pathology sent [place] per MD

## 2018-01-25 ENCOUNTER — Telehealth: Payer: Self-pay | Admitting: *Deleted

## 2018-01-25 ENCOUNTER — Other Ambulatory Visit: Payer: Self-pay

## 2018-01-25 DIAGNOSIS — F320 Major depressive disorder, single episode, mild: Secondary | ICD-10-CM | POA: Diagnosis not present

## 2018-01-25 NOTE — Telephone Encounter (Signed)
  Follow up Call-  Call back number 01/24/2018  Post procedure Call Back phone  # [phone]  Permission to leave phone message Yes  Some recent data might be hidden     Patient questions:  Do you have a fever, pain , or abdominal swelling? No. Pain Score  0 *  Have you tolerated food without any problems? Yes.    Have you been able to return to your normal activities? Yes.    Do you have any questions about your discharge instructions: Diet   No. Medications  No. Follow up visit  No.  Do you have questions or concerns about your Care? No.  Actions: * If pain score is 4 or above: No action needed, pain <4.

## 2018-01-26 ENCOUNTER — Telehealth: Payer: Self-pay | Admitting: Gastroenterology

## 2018-01-26 NOTE — Telephone Encounter (Signed)
Please see message and call Dr Melina Modena at your earliest convenience.  Thank you.

## 2018-01-26 NOTE — Telephone Encounter (Signed)
Dr.West calling to speak with Dr.Nandigam regarding this patient and coordinating her continuation of care with [facility] Dr.West best contact [phone].

## 2018-01-28 NOTE — Telephone Encounter (Signed)
Call Dr. Celene Skeen at [employer] [facility]  discussed the findings of colonoscopy and pathology report.  According to her adnexal mass is appearing to be likely benign based on tumor markers.  Plan to refer to [facility] for consultation for possible EMR of cecal polyp, tubulovillous adenoma.  They will try to coordinate her care with colorectal surgery and GI. Please fax the colonoscopy and pathology report. Thanks

## 2018-01-31 ENCOUNTER — Other Ambulatory Visit: Payer: Self-pay

## 2018-01-31 ENCOUNTER — Telehealth: Payer: Self-pay

## 2018-01-31 DIAGNOSIS — D369 Benign neoplasm, unspecified site: Secondary | ICD-10-CM

## 2018-01-31 NOTE — Telephone Encounter (Signed)
Faxed the procedure report and the biopsy report to nurse Collene Leyden for the patient's surgeon. [phone]

## 2018-02-01 ENCOUNTER — Encounter: Payer: Self-pay | Admitting: Genetics

## 2018-02-01 ENCOUNTER — Telehealth: Payer: Self-pay | Admitting: Genetics

## 2018-02-01 NOTE — Telephone Encounter (Signed)
New genetics referral received from [facility]. Pt has been scheduled to see Ferol Luz on 8/12 at 3pm. Pt aware to arrive 30 minutes early. Letter mailed.

## 2018-02-02 ENCOUNTER — Telehealth: Payer: Self-pay

## 2018-02-02 NOTE — Telephone Encounter (Signed)
Returned pt's call, she was asking about Dr. Glo Herring and had googled Dr Clark-Pearson's number and realized the number she dialed was [facility] office. Pt trying to coordinate her care with GI doctor and has surgery on July 16th. Directed pt to call [facility] gyn office number which is [phone]. No other needs per pt at this time.

## 2018-02-10 ENCOUNTER — Telehealth: Payer: Self-pay | Admitting: Gastroenterology

## 2018-02-10 NOTE — Telephone Encounter (Addendum)
Dr Fermin Schwab from [facility] called. He doesn't think the pelvic mass is malignant, most likely benign as has been stable in size.  Will plan to address the large cecal Tubulovillous adenoma first, please send referral to [facility] for EMR of the large cecal polyp.  She has appointment genetic counseling referral  K. Denzil Magnuson , MD [phone]   Addendum  Called patient, she has appointment for colonoscopy with EMR next week with Dr Stephanie Acre at [facility]. Please fax the records. Thanks

## 2018-02-10 NOTE — Telephone Encounter (Signed)
Confirmed appointment for her colonoscopy. Epic viewable records.

## 2018-02-11 DIAGNOSIS — G4733 Obstructive sleep apnea (adult) (pediatric): Secondary | ICD-10-CM | POA: Diagnosis not present

## 2018-02-11 DIAGNOSIS — E039 Hypothyroidism, unspecified: Secondary | ICD-10-CM | POA: Diagnosis not present

## 2018-02-11 DIAGNOSIS — Z6841 Body Mass Index (BMI) 40.0 and over, adult: Secondary | ICD-10-CM | POA: Diagnosis not present

## 2018-02-14 DIAGNOSIS — Z79899 Other long term (current) drug therapy: Secondary | ICD-10-CM | POA: Diagnosis not present

## 2018-02-14 DIAGNOSIS — Z888 Allergy status to other drugs, medicaments and biological substances status: Secondary | ICD-10-CM | POA: Diagnosis not present

## 2018-02-14 DIAGNOSIS — D490 Neoplasm of unspecified behavior of digestive system: Secondary | ICD-10-CM | POA: Diagnosis not present

## 2018-02-14 DIAGNOSIS — D374 Neoplasm of uncertain behavior of colon: Secondary | ICD-10-CM | POA: Diagnosis not present

## 2018-02-14 DIAGNOSIS — E669 Obesity, unspecified: Secondary | ICD-10-CM | POA: Diagnosis not present

## 2018-02-14 DIAGNOSIS — G4730 Sleep apnea, unspecified: Secondary | ICD-10-CM | POA: Diagnosis not present

## 2018-02-14 DIAGNOSIS — E039 Hypothyroidism, unspecified: Secondary | ICD-10-CM | POA: Diagnosis not present

## 2018-02-14 DIAGNOSIS — Z9989 Dependence on other enabling machines and devices: Secondary | ICD-10-CM | POA: Diagnosis not present

## 2018-02-14 DIAGNOSIS — D126 Benign neoplasm of colon, unspecified: Secondary | ICD-10-CM | POA: Diagnosis not present

## 2018-02-15 ENCOUNTER — Encounter (HOSPITAL_COMMUNITY): Payer: Self-pay | Admitting: Emergency Medicine

## 2018-02-15 ENCOUNTER — Other Ambulatory Visit: Payer: Self-pay

## 2018-02-15 ENCOUNTER — Emergency Department (HOSPITAL_COMMUNITY): Payer: BLUE CROSS/BLUE SHIELD

## 2018-02-15 ENCOUNTER — Inpatient Hospital Stay (HOSPITAL_COMMUNITY)
Admission: EM | Admit: 2018-02-15 | Discharge: 2018-02-18 | DRG: 920 | Disposition: A | Discharge status: Home / Self Care | Payer: BLUE CROSS/BLUE SHIELD | Attending: Internal Medicine | Admitting: Internal Medicine

## 2018-02-15 DIAGNOSIS — Z23 Encounter for immunization: Secondary | ICD-10-CM | POA: Diagnosis not present

## 2018-02-15 DIAGNOSIS — K921 Melena: Secondary | ICD-10-CM | POA: Diagnosis present

## 2018-02-15 DIAGNOSIS — Z86010 Personal history of colonic polyps: Secondary | ICD-10-CM

## 2018-02-15 DIAGNOSIS — N83202 Unspecified ovarian cyst, left side: Secondary | ICD-10-CM | POA: Diagnosis not present

## 2018-02-15 DIAGNOSIS — Z9889 Other specified postprocedural states: Secondary | ICD-10-CM

## 2018-02-15 DIAGNOSIS — Z90710 Acquired absence of both cervix and uterus: Secondary | ICD-10-CM

## 2018-02-15 DIAGNOSIS — Z79890 Hormone replacement therapy (postmenopausal): Secondary | ICD-10-CM

## 2018-02-15 DIAGNOSIS — K91840 Postprocedural hemorrhage and hematoma of a digestive system organ or structure following a digestive system procedure: Principal | ICD-10-CM | POA: Diagnosis present

## 2018-02-15 DIAGNOSIS — Y838 Other surgical procedures as the cause of abnormal reaction of the patient, or of later complication, without mention of misadventure at the time of the procedure: Secondary | ICD-10-CM | POA: Diagnosis present

## 2018-02-15 DIAGNOSIS — Z888 Allergy status to other drugs, medicaments and biological substances status: Secondary | ICD-10-CM

## 2018-02-15 DIAGNOSIS — R531 Weakness: Secondary | ICD-10-CM | POA: Diagnosis not present

## 2018-02-15 DIAGNOSIS — R0602 Shortness of breath: Secondary | ICD-10-CM | POA: Diagnosis not present

## 2018-02-15 DIAGNOSIS — Z79899 Other long term (current) drug therapy: Secondary | ICD-10-CM | POA: Diagnosis not present

## 2018-02-15 DIAGNOSIS — D62 Acute posthemorrhagic anemia: Secondary | ICD-10-CM | POA: Diagnosis not present

## 2018-02-15 DIAGNOSIS — K922 Gastrointestinal hemorrhage, unspecified: Secondary | ICD-10-CM

## 2018-02-15 DIAGNOSIS — R58 Hemorrhage, not elsewhere classified: Secondary | ICD-10-CM | POA: Diagnosis not present

## 2018-02-15 DIAGNOSIS — R109 Unspecified abdominal pain: Secondary | ICD-10-CM | POA: Diagnosis not present

## 2018-02-15 DIAGNOSIS — R110 Nausea: Secondary | ICD-10-CM | POA: Diagnosis not present

## 2018-02-15 DIAGNOSIS — Z6841 Body Mass Index (BMI) 40.0 and over, adult: Secondary | ICD-10-CM

## 2018-02-15 DIAGNOSIS — N390 Urinary tract infection, site not specified: Secondary | ICD-10-CM | POA: Diagnosis not present

## 2018-02-15 DIAGNOSIS — E039 Hypothyroidism, unspecified: Secondary | ICD-10-CM | POA: Diagnosis present

## 2018-02-15 DIAGNOSIS — R55 Syncope and collapse: Secondary | ICD-10-CM | POA: Diagnosis not present

## 2018-02-15 DIAGNOSIS — K633 Ulcer of intestine: Secondary | ICD-10-CM | POA: Diagnosis present

## 2018-02-15 DIAGNOSIS — R0902 Hypoxemia: Secondary | ICD-10-CM | POA: Diagnosis not present

## 2018-02-15 LAB — COMPREHENSIVE METABOLIC PANEL
ALT: 15 U/L (ref 0–44)
AST: 20 U/L (ref 15–41)
Albumin: 3.2 g/dL — ABNORMAL LOW (ref 3.5–5.0)
Alkaline Phosphatase: 45 U/L (ref 38–126)
Anion gap: 6 (ref 5–15)
BUN: 12 mg/dL (ref 6–20)
CO2: 28 mmol/L (ref 22–32)
Calcium: 8.6 mg/dL — ABNORMAL LOW (ref 8.9–10.3)
Chloride: 107 mmol/L (ref 98–111)
Creatinine, Ser: 0.72 mg/dL (ref 0.44–1.00)
GFR calc Af Amer: >60 mL/min (ref >60)
GFR calc non Af Amer: >60 mL/min (ref >60)
Glucose, Bld: 131 mg/dL — ABNORMAL HIGH (ref 70–99)
Potassium: 4.3 mmol/L (ref 3.5–5.1)
Sodium: 141 mmol/L (ref 135–145)
Total Bilirubin: 0.1 mg/dL — ABNORMAL LOW (ref 0.3–1.2)
Total Protein: 6.3 g/dL — ABNORMAL LOW (ref 6.5–8.1)

## 2018-02-15 LAB — CBC WITH DIFFERENTIAL/PLATELET
Basophils Absolute: 0 10*3/uL (ref 0.0–0.1)
Basophils Relative: 0 %
Eosinophils Absolute: 0.1 10*3/uL (ref 0.0–0.7)
Eosinophils Relative: 1 %
HCT: 33 % — ABNORMAL LOW (ref 36.0–46.0)
Hemoglobin: 11.1 g/dL — ABNORMAL LOW (ref 12.0–15.0)
Lymphocytes Relative: 18 %
Lymphs Abs: 2 10*3/uL (ref 0.7–4.0)
MCH: 31.4 pg (ref 26.0–34.0)
MCHC: 33.6 g/dL (ref 30.0–36.0)
MCV: 93.5 fL (ref 78.0–100.0)
Monocytes Absolute: 0.6 10*3/uL (ref 0.1–1.0)
Monocytes Relative: 5 %
Neutro Abs: 8.2 10*3/uL — ABNORMAL HIGH (ref 1.7–7.7)
Neutrophils Relative %: 76 %
Platelets: 312 10*3/uL (ref 150–400)
RBC: 3.53 MIL/uL — ABNORMAL LOW (ref 3.87–5.11)
RDW: 13 % (ref 11.5–15.5)
WBC: 10.8 10*3/uL — ABNORMAL HIGH (ref 4.0–10.5)

## 2018-02-15 LAB — I-STAT CHEM 8, ED
BUN: 10 mg/dL (ref 6–20)
Calcium, Ion: 1.22 mmol/L (ref 1.15–1.40)
Chloride: 103 mmol/L (ref 98–111)
Creatinine, Ser: 0.7 mg/dL (ref 0.44–1.00)
Glucose, Bld: 134 mg/dL — ABNORMAL HIGH (ref 70–99)
HCT: 30 % — ABNORMAL LOW (ref 36.0–46.0)
Hemoglobin: 10.2 g/dL — ABNORMAL LOW (ref 12.0–15.0)
Potassium: 4.1 mmol/L (ref 3.5–5.1)
Sodium: 141 mmol/L (ref 135–145)
TCO2: 25 mmol/L (ref 22–32)

## 2018-02-15 LAB — PROTIME-INR
INR: 1.03
Prothrombin Time: 13.4 seconds (ref 11.4–15.2)

## 2018-02-15 LAB — TYPE AND SCREEN
ABO/RH(D): O POS
Antibody Screen: NEGATIVE

## 2018-02-15 LAB — POC OCCULT BLOOD, ED: Fecal Occult Bld: POSITIVE — AB

## 2018-02-15 MED ORDER — HYDROMORPHONE HCL 1 MG/ML IJ SOLN
1.0000 mg | Freq: Once | INTRAMUSCULAR | Status: AC
  Administered 2018-02-15: 1 mg via INTRAVENOUS
  Filled 2018-02-15: qty 1

## 2018-02-15 MED ORDER — ONDANSETRON HCL 4 MG/2ML IJ SOLN
4.0000 mg | Freq: Once | INTRAMUSCULAR | Status: AC
  Administered 2018-02-15: 4 mg via INTRAVENOUS
  Filled 2018-02-15: qty 2

## 2018-02-15 MED ORDER — PEG 3350-KCL-NA BICARB-NACL 420 G PO SOLR
4000.0000 mL | Freq: Once | ORAL | Status: AC
  Administered 2018-02-16: 4000 mL via ORAL
  Filled 2018-02-15: qty 4000

## 2018-02-15 MED ORDER — IOPAMIDOL (ISOVUE-370) INJECTION 76%
100.0000 mL | Freq: Once | INTRAVENOUS | Status: AC | PRN
  Administered 2018-02-15: 100 mL via INTRAVENOUS

## 2018-02-15 MED ORDER — SODIUM CHLORIDE 0.9 % IV SOLN
INTRAVENOUS | Status: DC
  Administered 2018-02-16: 03:00:00 via INTRAVENOUS

## 2018-02-15 MED ORDER — SODIUM CHLORIDE 0.9 % IV SOLN: Freq: Once | INTRAVENOUS | Status: DC

## 2018-02-15 MED ORDER — IOPAMIDOL (ISOVUE-370) INJECTION 76%
INTRAVENOUS | Status: AC
  Filled 2018-02-15: qty 100

## 2018-02-15 NOTE — ED Notes (Signed)
Bed: RESA Expected date:  Expected time:  Means of arrival:  Comments: 47 yo lower gi bleed, hx colon surgery

## 2018-02-15 NOTE — ED Provider Notes (Signed)
[facility] DEPT Provider Note   CSN: [id number] Arrival date & time: 02/15/18  1925     History   Chief Complaint Chief Complaint  Patient presents with  . Rectal Bleeding  . Shortness of Breath    HPI [name] is a 47 y.o. female.  HPI 47 year old female with past medical history as below including recent colonoscopy with removal of 50 mm polyp in the cecum here with bright red blood per rectum.  Patient states that she had a colonoscopy with Dr. Phillip Heal at [facility] yesterday.  Over the last 24 hours, she has been recovering well and had no rectal bleeding.  However, just prior to arrival, patient developed a cramping, severe abdominal pain with associated bright red blood per rectum.  She began passing large clots and dark, maroon-colored stool.  She had lightheadedness and felt like she is going to pass out.  Currently, she reports that she still feels tired and generally weak.  Denies any blood thinner use.  No history of previous GI bleeds in the past.  She does not take NSAIDs regularly.  She was having the colon polyp removed and biopsied in the setting of known ovarian cyst which is going to be operated on.  Past Medical History:  Diagnosis Date  . Anxiety   . Benign ovarian tumor July 2012   17 pound benign tumor- removed  . Depression   . Thyroid disease     Patient Active Problem List   Diagnosis Date Noted  . Abnormal CT of the abdomen 01/12/2018  . Colon cancer screening 01/12/2018  . Acute upper respiratory infection 09/08/2016  . Obesity, unspecified 07/07/2013  . Hypothyroidism 09/27/2011    Past Surgical History:  Procedure Laterality Date  . ABDOMINAL HYSTERECTOMY  2012   one ovary remains- due to giant benign ovarian cyst (17 lbs)  . OVARIAN CYST REMOVAL       OB History   None      Home Medications    Prior to Admission medications   Medication Sig Start Date End Date Taking? Authorizing Provider    cholecalciferol (VITAMIN D) 1000 units tablet Take 2,000 Units by mouth daily.   Yes [provider]  Melatonin Gummies 2.5 MG CHEW Chew 1 tablet by mouth daily.    Yes [provider]  Multiple Vitamins-Minerals (MULTIVITAMIN WITH MINERALS) tablet Take 1 tablet by mouth daily. Reported on 07/24/2015   Yes [provider]  thyroid (ARMOUR THYROID) 30 MG tablet take 2 and 1/2 tablets by mouth once daily BEFORE BREAKFAST 06/07/17  Yes Copland, Gay Filler, MD  vitamin B-12 (CYANOCOBALAMIN) 1000 MCG tablet Take 1,000 mcg by mouth daily.   Yes [provider]  [place] KIT 17.5-3.13-1.6 GM/177ML SOLN Suprep-Use as directed Patient not taking: Reported on 02/15/2018 01/12/18   Zehr, Laban Emperor, PA-C    Family History Family History  Problem Relation Age of Onset  . Heart disease Father   . Stroke Father   . Heart attack Father   . Hyperlipidemia Father   . Hypertension Father   . Heart disease Paternal Grandfather   . Heart attack Paternal Grandfather   . COPD Mother   . Congestive Heart Failure Mother   . Gallbladder disease Maternal Grandmother   . Stroke Paternal Grandmother   . Colon cancer Neg Hx   . Stomach cancer Neg Hx     Social History Social History   Tobacco Use  . Smoking status: Never Smoker  .  Smokeless tobacco: Never Used  Substance Use Topics  . Alcohol use: No    Alcohol/week: 0.0 oz    Comment: socially  . Drug use: No     Allergies   Levothyroxine sodium and Levothyroxine   Review of Systems Review of Systems  Constitutional: Positive for fatigue. Negative for chills and fever.  HENT: Negative for congestion and rhinorrhea.   Eyes: Negative for visual disturbance.  Respiratory: Negative for cough, shortness of breath and wheezing.   Cardiovascular: Negative for chest pain and leg swelling.  Gastrointestinal: Positive for abdominal pain, blood in stool and nausea. Negative for diarrhea and vomiting.   Genitourinary: Negative for dysuria and flank pain.  Musculoskeletal: Negative for neck pain and neck stiffness.  Skin: Negative for rash and wound.  Allergic/Immunologic: Negative for immunocompromised state.  Neurological: Positive for weakness. Negative for syncope and headaches.  All other systems reviewed and are negative.    Physical Exam Updated Vital Signs BP 110/68   Pulse 77   Temp 97.7 F (36.5 C) (Oral)   Resp 19   Ht 5' 4"  (1.626 m)   Wt 123.4 kg (272 lb)   SpO2 90%   BMI 46.69 kg/m   Physical Exam  Constitutional: She is oriented to person, place, and time. She appears well-developed and well-nourished. No distress.  HENT:  Head: Normocephalic and atraumatic.  Eyes: Conjunctivae are normal.  Neck: Neck supple.  Cardiovascular: Normal rate, regular rhythm and normal heart sounds. Exam reveals no friction rub.  No murmur heard. Pulmonary/Chest: Effort normal and breath sounds normal. No respiratory distress. She has no wheezes. She has no rales.  Abdominal: Soft. She exhibits no distension. There is tenderness (Generalized).  Genitourinary:  Genitourinary Comments: Moderate amount of maroon-colored stool in rectal vault, with small clots noted externally.  Musculoskeletal: She exhibits no edema.  Neurological: She is alert and oriented to person, place, and time. She exhibits normal muscle tone.  Skin: Skin is warm. Capillary refill takes less than 2 seconds.  Psychiatric: She has a normal mood and affect.  Nursing note and vitals reviewed.    ED Treatments / Results  Labs (all labs ordered are listed, but only abnormal results are displayed) Labs Reviewed  CBC WITH DIFFERENTIAL/PLATELET - Abnormal; Notable for the following components:      Result Value   WBC 10.8 (*)    RBC 3.53 (*)    Hemoglobin 11.1 (*)    HCT 33.0 (*)    Neutro Abs 8.2 (*)    All other components within normal limits  COMPREHENSIVE METABOLIC PANEL - Abnormal; Notable for the  following components:   Glucose, Bld 131 (*)    Calcium 8.6 (*)    Total Protein 6.3 (*)    Albumin 3.2 (*)    Total Bilirubin 0.1 (*)    All other components within normal limits  POC OCCULT BLOOD, ED - Abnormal; Notable for the following components:   Fecal Occult Bld POSITIVE (*)    All other components within normal limits  I-STAT CHEM 8, ED - Abnormal; Notable for the following components:   Glucose, Bld 134 (*)    Hemoglobin 10.2 (*)    HCT 30.0 (*)    All other components within normal limits  PROTIME-INR  CBC  CBC  CBC  TYPE AND SCREEN  ABO/RH    EKG None  Radiology Ct Angio Abd/pel W And/or Wo Contrast  Result Date: 02/15/2018 CLINICAL DATA:  47 year old female with concern for GI bleed.  Abdominal pain. History of ovarian cyst. EXAM: CTA ABDOMEN AND PELVIS wITHOUT AND WITH CONTRAST TECHNIQUE: Multidetector CT imaging of the abdomen and pelvis was performed using the standard protocol during bolus administration of intravenous contrast. Multiplanar reconstructed images and MIPs were obtained and reviewed to evaluate the vascular anatomy. CONTRAST:  <See Chart> ISOVUE-370 IOPAMIDOL (ISOVUE-370) INJECTION 76% COMPARISON:  Pelvic ultrasound dated 01/10/2018 and CT of the abdomen pelvis dated 01/05/2018. FINDINGS: VASCULAR Aorta: Normal caliber aorta without aneurysm, dissection, vasculitis or significant stenosis. Celiac: Patent without evidence of aneurysm, dissection, vasculitis or significant stenosis. SMA: Patent without evidence of aneurysm, dissection, vasculitis or significant stenosis. Renals: Both renal arteries are patent without evidence of aneurysm, dissection, vasculitis, fibromuscular dysplasia or significant stenosis. IMA: Patent without evidence of aneurysm, dissection, vasculitis or significant stenosis. Inflow: Patent without evidence of aneurysm, dissection, vasculitis or significant stenosis. Proximal Outflow: Bilateral common femoral and visualized portions of  the superficial and profunda femoral arteries are patent without evidence of aneurysm, dissection, vasculitis or significant stenosis. Veins: No obvious venous abnormality within the limitations of this arterial phase study. Review of the MIP images confirms the above findings. NON-VASCULAR Lower chest: The visualized lung bases are clear. No intra-abdominal free air or free fluid. Hepatobiliary: No focal liver abnormality is seen. No gallstones, gallbladder wall thickening, or biliary dilatation. Pancreas: Unremarkable. No pancreatic ductal dilatation or surrounding inflammatory changes. Spleen: Normal in size without focal abnormality. Adrenals/Urinary Tract: The adrenal glands are unremarkable. There is no hydronephrosis or nephrolithiasis on either side. Subcentimeter bilateral renal hypodense lesions are too small to characterize. The visualized ureters and urinary bladder appear unremarkable. Stomach/Bowel: The previously seen cecal mass is not well seen on today's CT. Evaluation however is limited in the absence of oral contrast. An area of nodularity at the base of the cecum noted (series 10, image 173) which may represent cecal fold or stool content however, a mass or residual mass is not entirely excluded. There is no bowel obstruction or active inflammation. There is no evidence of active GI bleed. The appendix is normal. Lymphatic: Multiple pericecal lymph nodes measure up to 8 mm in short axis. No adenopathy. Reproductive: Hysterectomy. Complex/multi-septated and multi cystic left adnexal mass measures 11 x 6 cm. Further evaluation with MRI without and with contrast is recommended. Other: None Musculoskeletal: No acute or significant osseous findings. IMPRESSION: VASCULAR No acute findings. NON-VASCULAR 1. No acute intra-abdominal or pelvic pathology. No evidence of active GI bleed. 2. Previously seen cecal mass is not visualized with certainty on today's exam and may have been resected. Correlation  with surgical history recommended. Evaluation however is limited in the absence of oral contrast. 3. Left adnexal cystic mass as seen on the prior CT and ultrasound. Further characterization with MRI without and with contrast is recommended. Electronically Signed   By: Anner Crete M.D.   On: 02/15/2018 22:52    Procedures Procedures (including critical care time)  Medications Ordered in ED Medications  0.9 %  sodium chloride infusion (has no administration in time range)  polyethylene glycol-electrolytes (NuLYTELY/GoLYTELY) solution 4,000 mL (has no administration in time range)  0.9 %  sodium chloride infusion (has no administration in time range)  iopamidol (ISOVUE-370) 76 % injection 100 mL (100 mLs Intravenous Contrast Given 02/15/18 2142)  HYDROmorphone (DILAUDID) injection 1 mg (1 mg Intravenous Given 02/15/18 2240)  ondansetron (ZOFRAN) injection 4 mg (4 mg Intravenous Given 02/15/18 2240)     Initial Impression / Assessment and Plan / ED Course  I  have reviewed the triage vital signs and the nursing notes.  Pertinent labs & imaging results that were available during my care of the patient were reviewed by me and considered in my medical decision making (see chart for details).     47 year old female with past medical history as above here with bright red blood per rectum.  Patient is status post colonoscopy with polyp removal yesterday with Dr. Phillip Heal of [facility].  Patient hemodynamically stable here.  Hemoglobin is 11.1.  CT angios shows no active extravasation.  Discussed with Dr. Stephanie Acre of [facility], who is comfortable with pt being admitted here if she prefers. D/w Dr. Havery Moros of GI as well who is comfortable with this plan - recommends NPO, colonoscopy prep, serial CBCs, likely colonoscopy in AM. CBC ordered q4hr w/ prep. Pt, significant other updated and in agreement.  Final Clinical Impressions(s) / ED Diagnoses   Final diagnoses:  Lower GI bleed    ED Discharge Orders     None       Duffy Bruce, MD 02/16/18 [phone]

## 2018-02-15 NOTE — ED Notes (Signed)
Tried for IV x 2 and no success.

## 2018-02-15 NOTE — ED Triage Notes (Signed)
Patient had a colostomy done yesterday. Patient states about 15 minutes ago she had a gush of blood come out. Patient states that she is also having abdominal pain from an ovarian cyst.

## 2018-02-16 ENCOUNTER — Encounter (HOSPITAL_COMMUNITY)
Admission: EM | Disposition: A | Discharge status: Home / Self Care | Payer: Self-pay | Source: Home / Self Care | Attending: Internal Medicine

## 2018-02-16 ENCOUNTER — Encounter (HOSPITAL_COMMUNITY): Payer: Self-pay | Admitting: Internal Medicine

## 2018-02-16 ENCOUNTER — Observation Stay (HOSPITAL_COMMUNITY): Payer: BLUE CROSS/BLUE SHIELD | Admitting: Certified Registered Nurse Anesthetist

## 2018-02-16 DIAGNOSIS — E039 Hypothyroidism, unspecified: Secondary | ICD-10-CM | POA: Diagnosis not present

## 2018-02-16 DIAGNOSIS — D62 Acute posthemorrhagic anemia: Secondary | ICD-10-CM | POA: Diagnosis not present

## 2018-02-16 DIAGNOSIS — Z9889 Other specified postprocedural states: Secondary | ICD-10-CM

## 2018-02-16 DIAGNOSIS — K633 Ulcer of intestine: Secondary | ICD-10-CM | POA: Diagnosis not present

## 2018-02-16 DIAGNOSIS — K921 Melena: Secondary | ICD-10-CM | POA: Diagnosis not present

## 2018-02-16 DIAGNOSIS — K91840 Postprocedural hemorrhage and hematoma of a digestive system organ or structure following a digestive system procedure: Secondary | ICD-10-CM | POA: Diagnosis not present

## 2018-02-16 DIAGNOSIS — K922 Gastrointestinal hemorrhage, unspecified: Secondary | ICD-10-CM | POA: Diagnosis present

## 2018-02-16 HISTORY — PX: COLONOSCOPY WITH PROPOFOL: SHX5780

## 2018-02-16 LAB — BASIC METABOLIC PANEL
Anion gap: 8 (ref 5–15)
BUN: 11 mg/dL (ref 6–20)
CO2: 25 mmol/L (ref 22–32)
Calcium: 8.5 mg/dL — ABNORMAL LOW (ref 8.9–10.3)
Chloride: 107 mmol/L (ref 98–111)
Creatinine, Ser: 0.56 mg/dL (ref 0.44–1.00)
GFR calc Af Amer: >60 mL/min (ref >60)
GFR calc non Af Amer: >60 mL/min (ref >60)
Glucose, Bld: 100 mg/dL — ABNORMAL HIGH (ref 70–99)
Potassium: 3.8 mmol/L (ref 3.5–5.1)
Sodium: 140 mmol/L (ref 135–145)

## 2018-02-16 LAB — CBC
HCT: 31.2 % — ABNORMAL LOW (ref 36.0–46.0)
HCT: 32.2 % — ABNORMAL LOW (ref 36.0–46.0)
HCT: 32.3 % — ABNORMAL LOW (ref 36.0–46.0)
Hemoglobin: 10.5 g/dL — ABNORMAL LOW (ref 12.0–15.0)
Hemoglobin: 10.8 g/dL — ABNORMAL LOW (ref 12.0–15.0)
Hemoglobin: 10.9 g/dL — ABNORMAL LOW (ref 12.0–15.0)
MCH: 31.2 pg (ref 26.0–34.0)
MCH: 31.2 pg (ref 26.0–34.0)
MCH: 31.5 pg (ref 26.0–34.0)
MCHC: 33.4 g/dL (ref 30.0–36.0)
MCHC: 33.7 g/dL (ref 30.0–36.0)
MCHC: 33.9 g/dL (ref 30.0–36.0)
MCV: 92.3 fL (ref 78.0–100.0)
MCV: 93.4 fL (ref 78.0–100.0)
MCV: 93.7 fL (ref 78.0–100.0)
Platelets: 283 10*3/uL (ref 150–400)
Platelets: 304 10*3/uL (ref 150–400)
Platelets: 325 10*3/uL (ref 150–400)
RBC: 3.33 MIL/uL — ABNORMAL LOW (ref 3.87–5.11)
RBC: 3.46 MIL/uL — ABNORMAL LOW (ref 3.87–5.11)
RBC: 3.49 MIL/uL — ABNORMAL LOW (ref 3.87–5.11)
RDW: 13 % (ref 11.5–15.5)
RDW: 13.1 % (ref 11.5–15.5)
RDW: 13.1 % (ref 11.5–15.5)
WBC: 11 10*3/uL — ABNORMAL HIGH (ref 4.0–10.5)
WBC: 9.3 10*3/uL (ref 4.0–10.5)
WBC: 9.9 10*3/uL (ref 4.0–10.5)

## 2018-02-16 LAB — GLUCOSE, CAPILLARY
Glucose-Capillary: 93 mg/dL (ref 70–99)
Glucose-Capillary: 99 mg/dL (ref 70–99)
Glucose-Capillary: 79 mg/dL (ref 70–99)

## 2018-02-16 LAB — HIV ANTIBODY (ROUTINE TESTING W REFLEX): HIV Screen 4th Generation wRfx: NONREACTIVE

## 2018-02-16 LAB — ABO/RH: ABO/RH(D): O POS

## 2018-02-16 LAB — MRSA PCR SCREENING: MRSA by PCR: NEGATIVE

## 2018-02-16 SURGERY — COLONOSCOPY WITH PROPOFOL
Anesthesia: Monitor Anesthesia Care

## 2018-02-16 MED ORDER — ACETAMINOPHEN 325 MG PO TABS: 650.0000 mg | ORAL_TABLET | Freq: Four times a day (QID) | ORAL | Status: DC | PRN

## 2018-02-16 MED ORDER — ONDANSETRON HCL 4 MG/2ML IJ SOLN
INTRAMUSCULAR | Status: DC | PRN
  Administered 2018-02-16: 4 mg via INTRAVENOUS

## 2018-02-16 MED ORDER — SODIUM CHLORIDE 0.9 % IV SOLN: INTRAVENOUS | Status: DC

## 2018-02-16 MED ORDER — ONDANSETRON HCL 4 MG/2ML IJ SOLN: 4.0000 mg | Freq: Four times a day (QID) | INTRAMUSCULAR | Status: DC | PRN

## 2018-02-16 MED ORDER — LIDOCAINE 2% (20 MG/ML) 5 ML SYRINGE
INTRAMUSCULAR | Status: DC | PRN
  Administered 2018-02-16: 100 mg via INTRAVENOUS

## 2018-02-16 MED ORDER — LACTATED RINGERS IV SOLN
INTRAVENOUS | Status: DC
  Administered 2018-02-16: 1000 mL via INTRAVENOUS

## 2018-02-16 MED ORDER — PHENYLEPHRINE 40 MCG/ML (10ML) SYRINGE FOR IV PUSH (FOR BLOOD PRESSURE SUPPORT)
PREFILLED_SYRINGE | INTRAVENOUS | Status: DC | PRN
  Administered 2018-02-16 (×3): 80 ug via INTRAVENOUS

## 2018-02-16 MED ORDER — PROPOFOL 500 MG/50ML IV EMUL
INTRAVENOUS | Status: DC | PRN
  Administered 2018-02-16: 100 ug/kg/min via INTRAVENOUS

## 2018-02-16 MED ORDER — ONDANSETRON HCL 4 MG PO TABS: 4.0000 mg | ORAL_TABLET | Freq: Four times a day (QID) | ORAL | Status: DC | PRN

## 2018-02-16 MED ORDER — PROPOFOL 10 MG/ML IV BOLUS
INTRAVENOUS | Status: DC | PRN
  Administered 2018-02-16: 20 mg via INTRAVENOUS
  Administered 2018-02-16: 30 mg via INTRAVENOUS
  Administered 2018-02-16: 20 mg via INTRAVENOUS
  Administered 2018-02-16: 40 mg via INTRAVENOUS

## 2018-02-16 MED ORDER — PROPOFOL 10 MG/ML IV BOLUS
INTRAVENOUS | Status: AC
  Filled 2018-02-16: qty 60

## 2018-02-16 MED ORDER — ACETAMINOPHEN 650 MG RE SUPP: 650.0000 mg | Freq: Four times a day (QID) | RECTAL | Status: DC | PRN

## 2018-02-16 SURGICAL SUPPLY — 21 items

## 2018-02-16 NOTE — Transfer of Care (Signed)
Immediate Anesthesia Transfer of Care Note  Patient: [name]  Procedure(s) Performed: COLONOSCOPY WITH PROPOFOL (N/A )  Patient Location: PACU  Anesthesia Type:MAC  Level of Consciousness: sedated  Airway & Oxygen Therapy: Patient Spontanous Breathing and Patient connected to face mask oxygen  Post-op Assessment: Report given to RN and Post -op Vital signs reviewed and stable  Post vital signs: Reviewed and stable  Last Vitals:  Vitals Value Taken Time  BP    Temp    Pulse    Resp    SpO2      Last Pain:  Vitals:   02/16/18 1348  TempSrc: Oral  PainSc: 1       Patients Stated Pain Goal: 0 (50/27/74 1287)  Complications: No apparent anesthesia complications

## 2018-02-16 NOTE — Consult Note (Signed)
Referring Provider:   Dr. Ellender Hose, EDP Primary Care Physician:  Copland, Gay Filler, MD Primary Gastroenterologist:  Dr. Silverio Decamp  Reason for Consultation:  LGIB  HPI: [name] is a 47 y.o. female with PMH significant for hypothyroidism and ovarian cyst who recently underwent colonoscopy as she was noted to have a CT scan that showed suspicion for a large polyp in the cecum.  Colonoscopy at our practice was performed on 01/24/2018 and showed the following:  Findings:  - The perianal and digital rectal examinations were normal. - A 2 mm polyp was found in the descending colon. The polyp was sessile. The polyp was removed with a cold biopsy forceps. Resection and retrieval were complete. - A 40 mm polypoid lesion was found in the cecum. The lesion was frond-like/villous and multi-lobulated. This was biopsied with a cold forceps for histology. - Non-bleeding internal hemorrhoids were found during retroflexion. The hemorrhoids were small.  She was referred to [facility] for EMR and 02/14/2018 colonoscopy at [facility] showed the following:  Findings: A 50 mm frond-like/villous and polypoid mass was found in the cecum.   Preparations were made for mucosal resection. Area was successfully   injected with 40 cc Orise for lesion assessment, and this injection   appeared to lift the lesion adequately, there was a small area that did   not lift easily. Piecemeal mucosal resection using a snare was   performed. Resection and retrieval were complete with the help of a roth   net. Coagulation for hemostasis of bleeding caused by the procedure   using hot biopsy forceps was successful. The hot biopsy forcep was also   utlized for hot avulsion.  Biopsies obtained from the polyp as well as post polypectomy site.   Impression: - Rule out malignancy, tumor in the cecum. Complete removal  was accomplished. Injected. Treated with hot biopsy forceps. - Mucosal resection was performed. Resection and retrieval were complete.[place]   CT angio this admission negative for source/site of bleeding.  Last night around 6 PM she had sudden onset of urge to move her bowels and passed a massive amount of blood.  Occurred again and she says she became dizzy and near syncopal.  Called EMS and was brought to the hospital.  Hgb 10.5 grams this AM, which is down from 13.5 grams one month ago.  Feels better this AM.  Has been drinking GoLytely bowel prep and is almost finished.  Just had a BM with yellow liquid material and no blood. py   Past Medical History:  Diagnosis Date  . Anxiety   . Benign ovarian tumor July 2012   17 pound benign tumor- removed  . Depression   . Thyroid disease     Past Surgical History:  Procedure Laterality Date  . ABDOMINAL HYSTERECTOMY  2012   one ovary remains- due to giant benign ovarian cyst (17 lbs)  . OVARIAN CYST REMOVAL      Prior to Admission medications   Medication Sig Start Date End Date Taking? Authorizing Provider  cholecalciferol (VITAMIN D) 1000 units tablet Take 2,000 Units by mouth daily.   Yes [provider]  Melatonin Gummies 2.5 MG CHEW Chew 1 tablet by mouth daily.    Yes [provider]  Multiple Vitamins-Minerals (MULTIVITAMIN WITH MINERALS) tablet Take 1 tablet by mouth daily. Reported on 07/24/2015   Yes [provider]  thyroid (ARMOUR THYROID) 30 MG tablet take 2 and 1/2 tablets by mouth once daily BEFORE BREAKFAST 06/07/17  Yes  Copland, Gay Filler, MD  vitamin B-12 (CYANOCOBALAMIN) 1000 MCG tablet Take 1,000 mcg by mouth daily.   Yes [provider]  [place] KIT 17.5-3.13-1.6 GM/177ML SOLN Suprep-Use as directed Patient not taking: Reported on 02/15/2018 01/12/18   Zehr, Laban Emperor, PA-C    Current Facility-Administered Medications  Medication Dose Route Frequency Provider Last Rate  Last Dose  . 0.9 %  sodium chloride infusion   Intravenous Once Rise Patience, MD      . acetaminophen (TYLENOL) tablet 650 mg  650 mg Oral Q6H PRN Rise Patience, MD       Or  . acetaminophen (TYLENOL) suppository 650 mg  650 mg Rectal Q6H PRN Rise Patience, MD      . ondansetron [facility]) tablet 4 mg  4 mg Oral Q6H PRN Rise Patience, MD       Or  . ondansetron [facility]) injection 4 mg  4 mg Intravenous Q6H PRN Rise Patience, MD        Allergies as of 02/15/2018 - Review Complete 02/15/2018  Allergen Reaction Noted  . Levothyroxine sodium Anxiety and Palpitations 09/27/2011  . Levothyroxine Palpitations 01/20/2018    Family History  Problem Relation Age of Onset  . Heart disease Father   . Stroke Father   . Heart attack Father   . Hyperlipidemia Father   . Hypertension Father   . Heart disease Paternal Grandfather   . Heart attack Paternal Grandfather   . COPD Mother   . Congestive Heart Failure Mother   . Gallbladder disease Maternal Grandmother   . Stroke Paternal Grandmother   . Colon cancer Neg Hx   . Stomach cancer Neg Hx     Social History   Socioeconomic History  . Marital status: Soil scientist    Spouse name: Not on file  . Number of children: Not on file  . Years of education: Not on file  . Highest education level: Not on file  Occupational History  . Occupation: Education officer, community  Social Needs  . Financial resource strain: Not on file  . Food insecurity:    Worry: Not on file    Inability: Not on file  . Transportation needs:    Medical: Not on file    Non-medical: Not on file  Tobacco Use  . Smoking status: Never Smoker  . Smokeless tobacco: Never Used  Substance and Sexual Activity  . Alcohol use: No    Alcohol/week: 0.0 oz    Comment: socially  . Drug use: No  . Sexual activity: Yes  Lifestyle  . Physical activity:    Days per week: Not on file    Minutes per session: Not on file  . Stress: Not  on file  Relationships  . Social connections:    Talks on phone: Not on file    Gets together: Not on file    Attends religious service: Not on file    Active member of club or organization: Not on file    Attends meetings of clubs or organizations: Not on file    Relationship status: Not on file  . Intimate partner violence:    Fear of current or ex partner: Not on file    Emotionally abused: Not on file    Physically abused: Not on file    Forced sexual activity: Not on file  Other Topics Concern  . Not on file  Social History Narrative   Significant other   Education: [employer]  Exercise: Yes    Review of Systems: ROS is O/W negative except as mentioned in HPI.  Physical Exam: Vital signs in last 24 hours: Temp:  [97.7 F (36.5 C)-98.1 F (36.7 C)] 98.1 F (36.7 C) (07/17 0344) Pulse Rate:  [74-83] 78 (07/17 0300) Resp:  [8-25] 8 (07/17 0300) BP: (110-149)/(57-83) 133/62 (07/17 0300) SpO2:  [90 %-99 %] 97 % (07/17 0300) Weight:  [272 lb (123.4 kg)] 272 lb (123.4 kg) (07/16 1944) Last BM Date: 02/16/18 General:  Alert, Well-developed, well-nourished, pleasant and cooperative in NAD Head:  Normocephalic and atraumatic. Eyes:  Sclera clear, no icterus.  Conjunctiva pink. Ears:  Normal auditory acuity. Mouth:  No deformity or lesions.   Lungs:  Clear throughout to auscultation.   No wheezes, crackles, or rhonchi.  Heart:  Regular rate and rhythm; no murmurs, clicks, rubs, or gallops. Abdomen:  Soft, non-distended.  BS present.  Non-tender.  Msk:  Symmetrical without gross deformities. Pulses:  Normal pulses noted. Extremities:  Without clubbing or edema. Neurologic:  Alert and oriented x 4;  grossly normal neurologically. Skin:  Intact without significant lesions or rashes. Psych:  Alert and cooperative. Normal mood and affect.  Intake/Output from previous day: 07/16 0701 - 07/17 0700 In: 100 [I.V.:100] Out: -   Lab Results: Recent Labs    02/16/18 0015  02/16/18 0327 02/16/18 0804  WBC 11.0* 9.9 9.3  HGB 10.9* 10.8* 10.5*  HCT 32.2* 32.3* 31.2*  PLT 304 325 283   BMET Recent Labs    02/15/18 2013 02/15/18 2104 02/16/18 0534  NA 141 141 140  K 4.3 4.1 3.8  CL 107 103 107  CO2 28  --  25  GLUCOSE 131* 134* 100*  BUN _0 CREATININE 0.72 0.70 0.56  CALCIUM 8.6*  --  8.5*   LFT Recent Labs    02/15/18 2013  PROT 6.3*  ALBUMIN 3.2*  AST 20  ALT 15  ALKPHOS 45  BILITOT 0.1*   PT/INR Recent Labs    02/15/18 2013  LABPROT 13.4  INR 1.03   Studies/Results: Ct Angio Abd/pel W And/or Wo Contrast  Result Date: 02/15/2018 CLINICAL DATA:  47 year old female with concern for GI bleed. Abdominal pain. History of ovarian cyst. EXAM: CTA ABDOMEN AND PELVIS wITHOUT AND WITH CONTRAST TECHNIQUE: Multidetector CT imaging of the abdomen and pelvis was performed using the standard protocol during bolus administration of intravenous contrast. Multiplanar reconstructed images and MIPs were obtained and reviewed to evaluate the vascular anatomy. CONTRAST:  <See Chart> ISOVUE-370 IOPAMIDOL (ISOVUE-370) INJECTION 76% COMPARISON:  Pelvic ultrasound dated 01/10/2018 and CT of the abdomen pelvis dated 01/05/2018. FINDINGS: VASCULAR Aorta: Normal caliber aorta without aneurysm, dissection, vasculitis or significant stenosis. Celiac: Patent without evidence of aneurysm, dissection, vasculitis or significant stenosis. SMA: Patent without evidence of aneurysm, dissection, vasculitis or significant stenosis. Renals: Both renal arteries are patent without evidence of aneurysm, dissection, vasculitis, fibromuscular dysplasia or significant stenosis. IMA: Patent without evidence of aneurysm, dissection, vasculitis or significant stenosis. Inflow: Patent without evidence of aneurysm, dissection, vasculitis or significant stenosis. Proximal Outflow: Bilateral common femoral and visualized portions of the superficial and profunda femoral arteries are patent  without evidence of aneurysm, dissection, vasculitis or significant stenosis. Veins: No obvious venous abnormality within the limitations of this arterial phase study. Review of the MIP images confirms the above findings. NON-VASCULAR Lower chest: The visualized lung bases are clear. No intra-abdominal free air or free fluid. Hepatobiliary: No focal liver abnormality is seen. No gallstones,  gallbladder wall thickening, or biliary dilatation. Pancreas: Unremarkable. No pancreatic ductal dilatation or surrounding inflammatory changes. Spleen: Normal in size without focal abnormality. Adrenals/Urinary Tract: The adrenal glands are unremarkable. There is no hydronephrosis or nephrolithiasis on either side. Subcentimeter bilateral renal hypodense lesions are too small to characterize. The visualized ureters and urinary bladder appear unremarkable. Stomach/Bowel: The previously seen cecal mass is not well seen on today's CT. Evaluation however is limited in the absence of oral contrast. An area of nodularity at the base of the cecum noted (series 10, image 173) which may represent cecal fold or stool content however, a mass or residual mass is not entirely excluded. There is no bowel obstruction or active inflammation. There is no evidence of active GI bleed. The appendix is normal. Lymphatic: Multiple pericecal lymph nodes measure up to 8 mm in short axis. No adenopathy. Reproductive: Hysterectomy. Complex/multi-septated and multi cystic left adnexal mass measures 11 x 6 cm. Further evaluation with MRI without and with contrast is recommended. Other: None Musculoskeletal: No acute or significant osseous findings. IMPRESSION: VASCULAR No acute findings. NON-VASCULAR 1. No acute intra-abdominal or pelvic pathology. No evidence of active GI bleed. 2. Previously seen cecal mass is not visualized with certainty on today's exam and may have been resected. Correlation with surgical history recommended. Evaluation however is  limited in the absence of oral contrast. 3. Left adnexal cystic mass as seen on the prior CT and ultrasound. Further characterization with MRI without and with contrast is recommended. Electronically Signed   By: Anner Crete M.D.   On: 02/15/2018 22:52    IMPRESSION:  *Post-polypectomy bleed:  Had a 5 cm polyp removed via EMR and hot snare from the cecum at [facility] on 7/15. *ABLA:  Hgb down 3 grams from one month ago but bleed seems to have stopped for now at this point.  PLAN: *Colonoscopy later today. *Monitor Hgb and transfuse prn.   Laban Emperor. Zehr  02/16/2018, 8:56 AM

## 2018-02-16 NOTE — Anesthesia Postprocedure Evaluation (Addendum)
Anesthesia Post Note  Patient: [name]  Procedure(s) Performed: COLONOSCOPY WITH PROPOFOL (N/A )     Patient location during evaluation: Endoscopy Anesthesia Type: MAC Level of consciousness: awake Pain management: pain level controlled Vital Signs Assessment: post-procedure vital signs reviewed and stable Respiratory status: spontaneous breathing Cardiovascular status: stable Postop Assessment: no apparent nausea or vomiting Anesthetic complications: no    Last Vitals:  Vitals:   02/16/18 1520 02/16/18 1530  BP: 121/79 (!) 116/40  Pulse: 91 81  Resp: (!) 21 17  Temp: 36.7 C   SpO2: 100% 96%    Last Pain:  Vitals:   02/16/18 1520  TempSrc: Oral  PainSc: 0-No pain   Pain Goal: Patients Stated Pain Goal: 0 (02/15/18 1943)               [place]

## 2018-02-16 NOTE — Progress Notes (Signed)
  PROGRESS NOTE  Patient admitted earlier this morning. See H&P. [name] is a 47 y.o. female with history of hypothyroidism and ovarian cyst who had a recent CAT scan which showed polyp in the colon for which patient had colonoscopy done on Monday 3 days ago at [facility]. She underwent polypectomy at that time. Last evening, when patient moved her bowels she had a large amount of bloody bowel movement.  She had another episode following which patient became more dizzy and near syncopal. Case was discussed with GI and patient admitted for colonoscopy.   Patient doing better this morning.  She has nearly finished her GoLYTELY prep for colonoscopy.  Continues to have blood per rectum.  GI planning for colonoscopy today.   Dessa Phi, DO [facility] [email or web address] Password [facility] 02/16/2018, 12:11 PM

## 2018-02-16 NOTE — Anesthesia Preprocedure Evaluation (Signed)
Anesthesia Evaluation  Patient identified by MRN, date of birth, ID band Patient awake    Reviewed: Allergy & Precautions, NPO status , Patient's Chart, lab work & pertinent test results  Airway Mallampati: II       Dental no notable dental hx. (+) Teeth Intact   Pulmonary neg pulmonary ROS,    Pulmonary exam normal breath sounds clear to auscultation       Cardiovascular negative cardio ROS Normal cardiovascular exam Rhythm:Regular Rate:Normal     Neuro/Psych negative neurological ROS     GI/Hepatic Neg liver ROS,   Endo/Other  Hypothyroidism Morbid obesity  Renal/GU negative Renal ROS  negative genitourinary   Musculoskeletal negative musculoskeletal ROS (+)   Abdominal (+) + obese,   Peds  Hematology   Anesthesia Other Findings   Reproductive/Obstetrics                             Anesthesia Physical Anesthesia Plan  ASA: III  Anesthesia Plan: MAC   Post-op Pain Management:    Induction:   PONV Risk Score and Plan: 2 and Ondansetron and Dexamethasone  Airway Management Planned: Natural Airway, Mask and Nasal Cannula  Additional Equipment:   Intra-op Plan:   Post-operative Plan:   Informed Consent: I have reviewed the patients History and Physical, chart, labs and discussed the procedure including the risks, benefits and alternatives for the proposed anesthesia with the patient or authorized representative who has indicated his/her understanding and acceptance.     Plan Discussed with: CRNA  Anesthesia Plan Comments:         Anesthesia Quick Evaluation

## 2018-02-16 NOTE — H&P (Signed)
History and Physical    [name] LXB:[id number] DOB: 02-Apr-1971 DOA: 02/15/2018  PCP: Darreld Mclean, MD  Patient coming from: Home.  Chief Complaint: Lower GI bleed.  HPI: [name] is a 46 y.o. female with history of hypothyroidism and ovarian cyst who had a recent CAT scan which showed polyp in the colon for which patient had colonoscopy done on Monday 3 days ago and during which as per the patient had polypectomy done.  Last evening when patient moved her bowels she had a large amount of bloody bowel movement.  She had another episode following the following which patient became more dizzy and near syncopal.  Patient decided to come to the ER.  Seizure done at [facility].  ED Course: In the ER patient's vital signs were stable.  Hemoglobin was around 11 and in care everywhere last month it was around 13.  ER physician discussed with on-call gastroenterologist Dr. Havery Moros who advised to start patient on GoLYTELY and prep patient for colonoscopy.  Review of Systems: As per HPI, rest all negative.   Past Medical History:  Diagnosis Date  . Anxiety   . Benign ovarian tumor July 2012   17 pound benign tumor- removed  . Depression   . Thyroid disease     Past Surgical History:  Procedure Laterality Date  . ABDOMINAL HYSTERECTOMY  2012   one ovary remains- due to giant benign ovarian cyst (17 lbs)  . OVARIAN CYST REMOVAL       reports that she has never smoked. She has never used smokeless tobacco. She reports that she does not drink alcohol or use drugs.  Allergies  Allergen Reactions  . Levothyroxine Sodium Anxiety and Palpitations  . Levothyroxine Palpitations    Family History  Problem Relation Age of Onset  . Heart disease Father   . Stroke Father   . Heart attack Father   . Hyperlipidemia Father   . Hypertension Father   . Heart disease Paternal Grandfather   . Heart attack Paternal Grandfather   . COPD Mother   . Congestive Heart Failure  Mother   . Gallbladder disease Maternal Grandmother   . Stroke Paternal Grandmother   . Colon cancer Neg Hx   . Stomach cancer Neg Hx     Prior to Admission medications   Medication Sig Start Date End Date Taking? Authorizing Provider  cholecalciferol (VITAMIN D) 1000 units tablet Take 2,000 Units by mouth daily.   Yes [provider]  Melatonin Gummies 2.5 MG CHEW Chew 1 tablet by mouth daily.    Yes [provider]  Multiple Vitamins-Minerals (MULTIVITAMIN WITH MINERALS) tablet Take 1 tablet by mouth daily. Reported on 07/24/2015   Yes [provider]  thyroid (ARMOUR THYROID) 30 MG tablet take 2 and 1/2 tablets by mouth once daily BEFORE BREAKFAST 06/07/17  Yes Copland, Gay Filler, MD  vitamin B-12 (CYANOCOBALAMIN) 1000 MCG tablet Take 1,000 mcg by mouth daily.   Yes [provider]  C[place]oe KIT 17.5-3.13-1.6 GM/177ML SOLN Suprep-Use as directed Patient not taking: Reported on 02/15/2018 01/12/18   Loralie Champagne, PA-C    Physical Exam: Vitals:   02/16/18 0130 02/16/18 0210 02/16/18 0300 02/16/18 0344  BP: 112/71 (!) 113/57 133/62   Pulse: 83 80 78   Resp: 12 19 (!) 8   Temp:    98.1 F (36.7 C)  TempSrc:    Oral  SpO2: 90% 98% 97%   Weight:  Height:          Constitutional: Moderately built and nourished. Vitals:   02/16/18 0130 02/16/18 0210 02/16/18 0300 02/16/18 0344  BP: 112/71 (!) 113/57 133/62   Pulse: 83 80 78   Resp: 12 19 (!) 8   Temp:    98.1 F (36.7 C)  TempSrc:    Oral  SpO2: 90% 98% 97%   Weight:      Height:       Eyes: Anicteric no pallor. ENMT: No discharge from the ears eyes nose or mouth. Neck: No mass felt.  No neck rigidity.  No JVD appreciated. Respiratory: No rhonchi or crepitations. Cardiovascular: S1-S2 heard no murmurs appreciated. Abdomen: Soft nontender bowel sounds present. Musculoskeletal: No edema.  No joint effusion. Skin: No rash.  Skin appears warm. Neurologic: Alert awake  oriented to time place and person.  Moves all extremities. Psychiatric: Appears normal.  Normal affect.   Labs on Admission: I have personally reviewed following labs and imaging studies  CBC: Recent Labs  Lab 02/15/18 2013 02/15/18 2104 02/16/18 0015  WBC 10.8*  --  11.0*  NEUTROABS 8.2*  --   --   HGB 11.1* 10.2* 10.9*  HCT 33.0* 30.0* 32.2*  MCV 93.5  --  92.3  PLT 312  --  485   Basic Metabolic Panel: Recent Labs  Lab 02/15/18 2013 02/15/18 2104  NA 141 141  K 4.3 4.1  CL 107 103  CO2 28  --   GLUCOSE 131* 134*  BUN 12 10  CREATININE 0.72 0.70  CALCIUM 8.6*  --    GFR: Estimated Creatinine Clearance: 112.8 mL/min (by C-G formula based on SCr of 0.7 mg/dL). Liver Function Tests: Recent Labs  Lab 02/15/18 2013  AST 20  ALT 15  ALKPHOS 45  BILITOT 0.1*  PROT 6.3*  ALBUMIN 3.2*   No results for input(s): LIPASE, AMYLASE in the last 168 hours. No results for input(s): AMMONIA in the last 168 hours. Coagulation Profile: Recent Labs  Lab 02/15/18 2013  INR 1.03   Cardiac Enzymes: No results for input(s): CKTOTAL, CKMB, CKMBINDEX, TROPONINI in the last 168 hours. BNP (last 3 results) No results for input(s): PROBNP in the last 8760 hours. HbA1C: No results for input(s): HGBA1C in the last 72 hours. CBG: No results for input(s): GLUCAP in the last 168 hours. Lipid Profile: No results for input(s): CHOL, HDL, LDLCALC, TRIG, CHOLHDL, LDLDIRECT in the last 72 hours. Thyroid Function Tests: No results for input(s): TSH, T4TOTAL, FREET4, T3FREE, THYROIDAB in the last 72 hours. Anemia Panel: No results for input(s): VITAMINB12, FOLATE, FERRITIN, TIBC, IRON, RETICCTPCT in the last 72 hours. Urine analysis:    Component Value Date/Time   BILIRUBINUR negative 12/30/2017 1756   BILIRUBINUR neg 07/24/2014 1453   KETONESUR negative 12/30/2017 1756   PROTEINUR trace (A) 12/30/2017 1756   PROTEINUR neg 07/24/2014 1453   UROBILINOGEN 0.2 12/30/2017 1756    NITRITE Negative 12/30/2017 1756   NITRITE neg 07/24/2014 1453   LEUKOCYTESUR Negative 12/30/2017 1756   Sepsis Labs: @LABRCNTIP (procalcitonin:4,lacticidven:4) ) Recent Results (from the past 240 hour(s))  MRSA PCR Screening     Status: None   Collection Time: 02/16/18  2:41 AM  Result Value Ref Range Status   MRSA by PCR NEGATIVE NEGATIVE Final    Comment:        The GeneXpert MRSA Assay (FDA approved for NASAL specimens only), is one component of a comprehensive MRSA colonization surveillance program. It is not intended to  diagnose MRSA infection nor to guide or monitor treatment for MRSA infections. Performed at [facility], [place] [address] [place], [place] [address]      Radiological Exams on Admission: Ct Angio Abd/pel W And/or Wo Contrast  Result Date: 02/15/2018 CLINICAL DATA:  47 year old female with concern for GI bleed. Abdominal pain. History of ovarian cyst. EXAM: CTA ABDOMEN AND PELVIS wITHOUT AND WITH CONTRAST TECHNIQUE: Multidetector CT imaging of the abdomen and pelvis was performed using the standard protocol during bolus administration of intravenous contrast. Multiplanar reconstructed images and MIPs were obtained and reviewed to evaluate the vascular anatomy. CONTRAST:  <See Chart> ISOVUE-370 IOPAMIDOL (ISOVUE-370) INJECTION 76% COMPARISON:  Pelvic ultrasound dated 01/10/2018 and CT of the abdomen pelvis dated 01/05/2018. FINDINGS: VASCULAR Aorta: Normal caliber aorta without aneurysm, dissection, vasculitis or significant stenosis. Celiac: Patent without evidence of aneurysm, dissection, vasculitis or significant stenosis. SMA: Patent without evidence of aneurysm, dissection, vasculitis or significant stenosis. Renals: Both renal arteries are patent without evidence of aneurysm, dissection, vasculitis, fibromuscular dysplasia or significant stenosis. IMA: Patent without evidence of aneurysm, dissection, vasculitis or significant stenosis. Inflow:  Patent without evidence of aneurysm, dissection, vasculitis or significant stenosis. Proximal Outflow: Bilateral common femoral and visualized portions of the superficial and profunda femoral arteries are patent without evidence of aneurysm, dissection, vasculitis or significant stenosis. Veins: No obvious venous abnormality within the limitations of this arterial phase study. Review of the MIP images confirms the above findings. NON-VASCULAR Lower chest: The visualized lung bases are clear. No intra-abdominal free air or free fluid. Hepatobiliary: No focal liver abnormality is seen. No gallstones, gallbladder wall thickening, or biliary dilatation. Pancreas: Unremarkable. No pancreatic ductal dilatation or surrounding inflammatory changes. Spleen: Normal in size without focal abnormality. Adrenals/Urinary Tract: The adrenal glands are unremarkable. There is no hydronephrosis or nephrolithiasis on either side. Subcentimeter bilateral renal hypodense lesions are too small to characterize. The visualized ureters and urinary bladder appear unremarkable. Stomach/Bowel: The previously seen cecal mass is not well seen on today's CT. Evaluation however is limited in the absence of oral contrast. An area of nodularity at the base of the cecum noted (series 10, image 173) which may represent cecal fold or stool content however, a mass or residual mass is not entirely excluded. There is no bowel obstruction or active inflammation. There is no evidence of active GI bleed. The appendix is normal. Lymphatic: Multiple pericecal lymph nodes measure up to 8 mm in short axis. No adenopathy. Reproductive: Hysterectomy. Complex/multi-septated and multi cystic left adnexal mass measures 11 x 6 cm. Further evaluation with MRI without and with contrast is recommended. Other: None Musculoskeletal: No acute or significant osseous findings. IMPRESSION: VASCULAR No acute findings. NON-VASCULAR 1. No acute intra-abdominal or pelvic pathology.  No evidence of active GI bleed. 2. Previously seen cecal mass is not visualized with certainty on today's exam and may have been resected. Correlation with surgical history recommended. Evaluation however is limited in the absence of oral contrast. 3. Left adnexal cystic mass as seen on the prior CT and ultrasound. Further characterization with MRI without and with contrast is recommended. Electronically Signed   By: Anner Crete M.D.   On: 02/15/2018 22:52      Assessment/Plan Principal Problem:   Acute lower GI bleeding Active Problems:   Hypothyroidism   Acute blood loss anemia   Acute GI bleeding    1. Acute lower GI bleeding -patient just had a colonoscopy with polypectomy likely source.  Dr. Havery Moros on-call gastroenterology has  been notified.  Patient is at this time getting GoLYTELY prep for colonoscopy.  Will keep patient n.p.o. on IV fluids follow CBC. 2. History of hypothyroidism on thyroid replacement which can be continued after patient starts taking p.o. 3. History of sleep apnea on CPAP.  Patient is getting colonoscopy prep in the night. 4. Acute blood loss anemia follow CBC. 5. History of ovarian cyst being followed at [facility].   DVT prophylaxis: SCDs. Code Status: Full code. Family Communication: Discussed with patient. Disposition Plan: Home. Consults called: Gastroenterology. Admission status: Observation.   Rise Patience MD [facility] Pager [phone].  If 7PM-7AM, please contact night-coverage [email or web address] Password [email or web address]  02/16/2018, 4:05 AM

## 2018-02-16 NOTE — Op Note (Signed)
[facility] Patient Name: [name] Procedure Date: 02/16/2018 MRN: [id number] Attending MD: Jerene Bears , MD Date of Birth: 1970/10/16 CSN: [id number] Age: 47 Admit Type: Inpatient Procedure:                Colonoscopy Indications:              Hematochezia, Treatment of bleeding from                            polypectomy site Providers:                Lajuan Lines. Hilarie Fredrickson, MD, Burtis Junes, RN, William Dalton,                            Technician Referring MD:             [facility] Medicines:                Monitored Anesthesia Care Complications:            No immediate complications. Estimated Blood Loss:     Estimated blood loss: none. Procedure:                Pre-Anesthesia Assessment:                           - Prior to the procedure, a History and Physical                            was performed, and patient medications and                            allergies were reviewed. The patient's tolerance of                            previous anesthesia was also reviewed. The risks                            and benefits of the procedure and the sedation                            options and risks were discussed with the patient.                            All questions were answered, and informed consent                            was obtained. Prior Anticoagulants: The patient has                            taken no previous anticoagulant or antiplatelet                            agents. ASA Grade Assessment: III - A patient with                            severe  systemic disease. After reviewing the risks                            and benefits, the patient was deemed in                            satisfactory condition to undergo the procedure.                           After obtaining informed consent, the colonoscope                            was passed under direct vision. Throughout the                            procedure, the patient's  blood pressure, pulse, and                            oxygen saturations were monitored continuously. The                            EC-[id number] ([id number]) scope was introduced through                            the anus and advanced to the terminal ileum. The                            patient tolerated the procedure well. The quality                            of the bowel preparation was good. The terminal                            ileum, ileocecal valve, appendiceal orifice, and                            rectum were photographed. The colonoscopy was                            somewhat difficult due to significant looping.                            Successful completion of the procedure was aided by                            applying abdominal pressure (left abdomen and                            transverse colon). Scope In: 2:52:47 PM Scope Out: 3:16:48 PM Scope Withdrawal Time: 0 hours 6 minutes 9 seconds  Total Procedure Duration: 0 hours 24 minutes 1 second  Findings:      The digital rectal exam was normal.      A single (solitary) 50 mm post-polypectomy ulcer was found in the cecum.  No active bleeding was present. There were scattered flat red spots on       the ulcer bed, but no dominant or raised visible vessels. Given the lack       of active bleeding and the size of the ulcer bed (relative to an       endoclip) the decision was made not to intervene endoscopically.      The exam was otherwise without abnormality on direct and retroflexion       views. Impression:               - 5 cm post-polypectomy ulcer in the cecum. No                            active bleeding.                           - The examination was otherwise normal on direct                            and retroflexion views.                           - No specimens collected. Moderate Sedation:      N/A- Per Anesthesia Care Recommendation:           - Return patient to hospital ward for  observation.                           - Clear liquid diet.                           - No aspirin, ibuprofen, naproxen, or other                            non-steroidal anti-inflammatory drugs.                           - Closely monitor for recurrent bleeding.                           - Closely monitor Hgb.                           - GI will follow. Procedure Code(s):        --- Professional ---                           [phone], Colonoscopy, flexible; diagnostic, including                            collection of specimen(s) by brushing or washing,                            when performed (separate procedure) Diagnosis Code(s):        --- Professional ---                           K63.3, Ulcer of intestine  K92.1, Melena (includes Hematochezia)                           K91.840, Postprocedural hemorrhage of a digestive                            system organ or structure following a digestive                            system procedure CPT copyright 2017 [employer]. All rights reserved. The codes documented in this report are preliminary and upon coder review may  be revised to meet current compliance requirements. Jerene Bears, MD 02/16/2018 3:31:48 PM This report has been signed electronically. Number of Addenda: 0

## 2018-02-17 DIAGNOSIS — Z9889 Other specified postprocedural states: Secondary | ICD-10-CM

## 2018-02-17 DIAGNOSIS — N390 Urinary tract infection, site not specified: Secondary | ICD-10-CM | POA: Diagnosis present

## 2018-02-17 DIAGNOSIS — Y838 Other surgical procedures as the cause of abnormal reaction of the patient, or of later complication, without mention of misadventure at the time of the procedure: Secondary | ICD-10-CM | POA: Diagnosis present

## 2018-02-17 DIAGNOSIS — K91840 Postprocedural hemorrhage and hematoma of a digestive system organ or structure following a digestive system procedure: Secondary | ICD-10-CM | POA: Diagnosis present

## 2018-02-17 DIAGNOSIS — E039 Hypothyroidism, unspecified: Secondary | ICD-10-CM | POA: Diagnosis present

## 2018-02-17 DIAGNOSIS — Z90710 Acquired absence of both cervix and uterus: Secondary | ICD-10-CM | POA: Diagnosis not present

## 2018-02-17 DIAGNOSIS — Z86010 Personal history of colonic polyps: Secondary | ICD-10-CM

## 2018-02-17 DIAGNOSIS — K633 Ulcer of intestine: Secondary | ICD-10-CM | POA: Diagnosis present

## 2018-02-17 DIAGNOSIS — K921 Melena: Secondary | ICD-10-CM | POA: Diagnosis present

## 2018-02-17 DIAGNOSIS — D62 Acute posthemorrhagic anemia: Secondary | ICD-10-CM | POA: Diagnosis not present

## 2018-02-17 DIAGNOSIS — K922 Gastrointestinal hemorrhage, unspecified: Secondary | ICD-10-CM

## 2018-02-17 DIAGNOSIS — Z79899 Other long term (current) drug therapy: Secondary | ICD-10-CM | POA: Diagnosis not present

## 2018-02-17 DIAGNOSIS — Z23 Encounter for immunization: Secondary | ICD-10-CM | POA: Diagnosis not present

## 2018-02-17 DIAGNOSIS — Z888 Allergy status to other drugs, medicaments and biological substances status: Secondary | ICD-10-CM | POA: Diagnosis not present

## 2018-02-17 DIAGNOSIS — Z79890 Hormone replacement therapy (postmenopausal): Secondary | ICD-10-CM | POA: Diagnosis not present

## 2018-02-17 DIAGNOSIS — Z6841 Body Mass Index (BMI) 40.0 and over, adult: Secondary | ICD-10-CM | POA: Diagnosis not present

## 2018-02-17 LAB — HEMOGLOBIN AND HEMATOCRIT, BLOOD
HCT: 29.4 % — ABNORMAL LOW (ref 36.0–46.0)
Hemoglobin: 10 g/dL — ABNORMAL LOW (ref 12.0–15.0)

## 2018-02-17 LAB — URINALYSIS, ROUTINE W REFLEX MICROSCOPIC
Bilirubin Urine: NEGATIVE
Glucose, UA: NEGATIVE mg/dL
Ketones, ur: NEGATIVE mg/dL
Nitrite: NEGATIVE
Protein, ur: NEGATIVE mg/dL
RBC / HPF: >50 RBC/hpf — ABNORMAL HIGH (ref 0–5)
Specific Gravity, Urine: 1.01 (ref 1.005–1.030)
WBC, UA: >50 WBC/hpf — ABNORMAL HIGH (ref 0–5)
pH: 6 (ref 5.0–8.0)

## 2018-02-17 LAB — CBC
HCT: 28.6 % — ABNORMAL LOW (ref 36.0–46.0)
Hemoglobin: 9.5 g/dL — ABNORMAL LOW (ref 12.0–15.0)
MCH: 31.1 pg (ref 26.0–34.0)
MCHC: 33.2 g/dL (ref 30.0–36.0)
MCV: 93.8 fL (ref 78.0–100.0)
Platelets: 245 10*3/uL (ref 150–400)
RBC: 3.05 MIL/uL — ABNORMAL LOW (ref 3.87–5.11)
RDW: 13.3 % (ref 11.5–15.5)
WBC: 8.1 10*3/uL (ref 4.0–10.5)

## 2018-02-17 LAB — GLUCOSE, CAPILLARY
Glucose-Capillary: 70 mg/dL (ref 70–99)
Glucose-Capillary: 103 mg/dL — ABNORMAL HIGH (ref 70–99)

## 2018-02-17 MED ORDER — SODIUM CHLORIDE 0.9 % IV SOLN
INTRAVENOUS | Status: DC
Start: 2018-02-17 — End: 2018-02-18
  Administered 2018-02-17 – 2018-02-18 (×2): via INTRAVENOUS

## 2018-02-17 NOTE — Progress Notes (Signed)
PROGRESS NOTE    [name]  INO:[id number] DOB: 04/11/1971 DOA: 02/15/2018 PCP: Darreld Mclean, MD     Brief Narrative:  [name] a 47 y.o.femalewithhistory of hypothyroidism and ovarian cyst who had a recent CAT scan which showed polyp in the colon for which patient had colonoscopy done on Monday 3 days ago at [facility]. She underwent polypectomy at that time. Last evening, when patient moved her bowels she had a large amount of bloody bowel movement.She had another episode following which patient became more dizzy and near syncopal. Case was discussed with GI and patient admitted for colonoscopy.   New events last 24 hours / Subjective: She underwent colonoscopy which revealed nonbleeding ulcer at polypectomy site. She is feeling very bloated, had a very small bowel movement which was brown. Overall, not feeling well today. Feels like how she felt prior to coming into the hospital. Hasnt been able to eat or walk around yet.   Assessment & Plan:   Principal Problem:   Acute lower GI bleeding Active Problems:   Hypothyroidism   Acute blood loss anemia   Acute GI bleeding   Acute lower GI bleeding status post colonoscopy and polypectomy at [place] colonoscopy which revealed nonbleeding ulcer at polypectomy site Continue to trend CBC GI following  Dysuria Check UA     DVT prophylaxis: SCD Code Status: Full code Family Communication: At bedside Disposition Plan: Pending repeat CBC and clinical improvement   Consultants:   GI  Procedures:   Colonoscopy 7/17 Impression:       - 5 cm post-polypectomy ulcer in the cecum. No                            active bleeding.                           - The examination was otherwise normal on direct                            and retroflexion views.                           - No specimens collected.     Antimicrobials:  Anti-infectives (From admission, onward)   None        Objective: Vitals:     02/17/18 0900 02/17/18 1000 02/17/18 1100 02/17/18 1125  BP: (!) 143/71 139/72 (!) 118/58   Pulse: 76 79 72   Resp: 15 16 (!) 0   Temp:    98.1 F (36.7 C)  TempSrc:    Oral  SpO2: 98% 96% 95%   Weight:      Height:        Intake/Output Summary (Last 24 hours) at 02/17/2018 1255 Last data filed at 02/16/2018 2300 Gross per 24 hour  Intake 1060 ml  Output -  Net 1060 ml   Filed Weights   02/15/18 1944 02/16/18 1348 02/17/18 0500  Weight: 123.4 kg (272 lb) 123.4 kg (272 lb) 124.7 kg (274 lb 14.6 oz)    Examination:  General exam: Appears calm and comfortable  Respiratory system: Clear to auscultation. Respiratory effort normal. Cardiovascular system: S1 & S2 heard, RRR. No JVD, murmurs, rubs, gallops or clicks. No pedal edema. Gastrointestinal system: Abdomen is mildly distended, soft and nontender. No organomegaly or masses felt. Normal bowel  sounds heard. Central nervous system: Alert and oriented. No focal neurological deficits. Extremities: Symmetric 5 x 5 power. Skin: No rashes, lesions or ulcers Psychiatry: Judgement and insight appear normal. Mood & affect appropriate.   Data Reviewed: I have personally reviewed following labs and imaging studies  CBC: Recent Labs  Lab 02/15/18 2013 02/15/18 2104 02/16/18 0015 02/16/18 0327 02/16/18 0804 02/17/18 0354  WBC 10.8*  --  11.0* 9.9 9.3 8.1  NEUTROABS 8.2*  --   --   --   --   --   HGB 11.1* 10.2* 10.9* 10.8* 10.5* 9.5*  HCT 33.0* 30.0* 32.2* 32.3* 31.2* 28.6*  MCV 93.5  --  92.3 93.4 93.7 93.8  PLT 312  --  304 325 283 735   Basic Metabolic Panel: Recent Labs  Lab 02/15/18 2013 02/15/18 2104 02/16/18 0534  NA 141 141 140  K 4.3 4.1 3.8  CL 107 103 107  CO2 28  --  25  GLUCOSE 131* 134* 100*  BUN 12 10 11   CREATININE 0.72 0.70 0.56  CALCIUM 8.6*  --  8.5*   GFR: Estimated Creatinine Clearance: 113.5 mL/min (by C-G formula based on SCr of 0.56 mg/dL). Liver Function Tests: Recent Labs  Lab  02/15/18 2013  AST 20  ALT 15  ALKPHOS 45  BILITOT 0.1*  PROT 6.3*  ALBUMIN 3.2*   No results for input(s): LIPASE, AMYLASE in the last 168 hours. No results for input(s): AMMONIA in the last 168 hours. Coagulation Profile: Recent Labs  Lab 02/15/18 2013  INR 1.03   Cardiac Enzymes: No results for input(s): CKTOTAL, CKMB, CKMBINDEX, TROPONINI in the last 168 hours. BNP (last 3 results) No results for input(s): PROBNP in the last 8760 hours. HbA1C: No results for input(s): HGBA1C in the last 72 hours. CBG: Recent Labs  Lab 02/16/18 0612 02/16/18 1757 02/16/18 2306 02/17/18 0521 02/17/18 1120  GLUCAP 93 99 79 70 103*   Lipid Profile: No results for input(s): CHOL, HDL, LDLCALC, TRIG, CHOLHDL, LDLDIRECT in the last 72 hours. Thyroid Function Tests: No results for input(s): TSH, T4TOTAL, FREET4, T3FREE, THYROIDAB in the last 72 hours. Anemia Panel: No results for input(s): VITAMINB12, FOLATE, FERRITIN, TIBC, IRON, RETICCTPCT in the last 72 hours. Sepsis Labs: No results for input(s): PROCALCITON, LATICACIDVEN in the last 168 hours.  Recent Results (from the past 240 hour(s))  MRSA PCR Screening     Status: None   Collection Time: 02/16/18  2:41 AM  Result Value Ref Range Status   MRSA by PCR NEGATIVE NEGATIVE Final    Comment:        The GeneXpert MRSA Assay (FDA approved for NASAL specimens only), is one component of a comprehensive MRSA colonization surveillance program. It is not intended to diagnose MRSA infection nor to guide or monitor treatment for MRSA infections. Performed at [facility], [place] [address], [place], [place] [address]        Radiology Studies: Ct Angio Abd/pel W And/or Wo Contrast  Result Date: 02/15/2018 CLINICAL DATA:  47 year old female with concern for GI bleed. Abdominal pain. History of ovarian cyst. EXAM: CTA ABDOMEN AND PELVIS wITHOUT AND WITH CONTRAST TECHNIQUE: Multidetector CT imaging of the abdomen and  pelvis was performed using the standard protocol during bolus administration of intravenous contrast. Multiplanar reconstructed images and MIPs were obtained and reviewed to evaluate the vascular anatomy. CONTRAST:  <See Chart> ISOVUE-370 IOPAMIDOL (ISOVUE-370) INJECTION 76% COMPARISON:  Pelvic ultrasound dated 01/10/2018 and CT of the abdomen pelvis  dated 01/05/2018. FINDINGS: VASCULAR Aorta: Normal caliber aorta without aneurysm, dissection, vasculitis or significant stenosis. Celiac: Patent without evidence of aneurysm, dissection, vasculitis or significant stenosis. SMA: Patent without evidence of aneurysm, dissection, vasculitis or significant stenosis. Renals: Both renal arteries are patent without evidence of aneurysm, dissection, vasculitis, fibromuscular dysplasia or significant stenosis. IMA: Patent without evidence of aneurysm, dissection, vasculitis or significant stenosis. Inflow: Patent without evidence of aneurysm, dissection, vasculitis or significant stenosis. Proximal Outflow: Bilateral common femoral and visualized portions of the superficial and profunda femoral arteries are patent without evidence of aneurysm, dissection, vasculitis or significant stenosis. Veins: No obvious venous abnormality within the limitations of this arterial phase study. Review of the MIP images confirms the above findings. NON-VASCULAR Lower chest: The visualized lung bases are clear. No intra-abdominal free air or free fluid. Hepatobiliary: No focal liver abnormality is seen. No gallstones, gallbladder wall thickening, or biliary dilatation. Pancreas: Unremarkable. No pancreatic ductal dilatation or surrounding inflammatory changes. Spleen: Normal in size without focal abnormality. Adrenals/Urinary Tract: The adrenal glands are unremarkable. There is no hydronephrosis or nephrolithiasis on either side. Subcentimeter bilateral renal hypodense lesions are too small to characterize. The visualized ureters and urinary  bladder appear unremarkable. Stomach/Bowel: The previously seen cecal mass is not well seen on today's CT. Evaluation however is limited in the absence of oral contrast. An area of nodularity at the base of the cecum noted (series 10, image 173) which may represent cecal fold or stool content however, a mass or residual mass is not entirely excluded. There is no bowel obstruction or active inflammation. There is no evidence of active GI bleed. The appendix is normal. Lymphatic: Multiple pericecal lymph nodes measure up to 8 mm in short axis. No adenopathy. Reproductive: Hysterectomy. Complex/multi-septated and multi cystic left adnexal mass measures 11 x 6 cm. Further evaluation with MRI without and with contrast is recommended. Other: None Musculoskeletal: No acute or significant osseous findings. IMPRESSION: VASCULAR No acute findings. NON-VASCULAR 1. No acute intra-abdominal or pelvic pathology. No evidence of active GI bleed. 2. Previously seen cecal mass is not visualized with certainty on today's exam and may have been resected. Correlation with surgical history recommended. Evaluation however is limited in the absence of oral contrast. 3. Left adnexal cystic mass as seen on the prior CT and ultrasound. Further characterization with MRI without and with contrast is recommended. Electronically Signed   By: Anner Crete M.D.   On: 02/15/2018 22:52      Scheduled Meds: Continuous Infusions:   LOS: 0 days    Time spent: 25 minutes   Dessa Phi, DO [facility] [email or web address] Password [facility] 02/17/2018, 12:55 PM

## 2018-02-17 NOTE — Progress Notes (Signed)
Report called to receiving RN [id number]. Patient with no complaints at the current time. Will transfer via [place].

## 2018-02-17 NOTE — Progress Notes (Signed)
[facility] Progress Note  CC:  GI bleed  Subjective:  No sign of bleeding but generally just does not feel well today.  Abdomen feels bloated.  Nervous at thought of discharge today.  Objective:  Vital signs in last 24 hours: Temp:  [97.6 F (36.4 C)-98.9 F (37.2 C)] 97.6 F (36.4 C) (07/18 0740) Pulse Rate:  [61-91] 72 (07/18 0800) Resp:  [10-21] 13 (07/18 0800) BP: (98-140)/(39-83) 139/73 (07/18 0800) SpO2:  [91 %-100 %] 99 % (07/18 0800) Weight:  [272 lb (123.4 kg)-274 lb 14.6 oz (124.7 kg)] 274 lb 14.6 oz (124.7 kg) (07/18 0500) Last BM Date: 02/16/18 General:  Alert, Well-developed, in NAD Heart:  Regular rate and rhythm; no murmurs Pulm:  CTAB.  No increased WOB. Abdomen:  Soft, non-distended.  BS present.  Non-tender. Extremities:  Without edema. Neurologic:  Alert and oriented x 4;  grossly normal neurologically. Psych:  Alert and cooperative. Normal mood and affect.  Intake/Output from previous day: 07/17 0701 - 07/18 0700 In: 1560 [P.O.:360; I.V.:1200] Out: -   Lab Results: Recent Labs    02/16/18 0327 02/16/18 0804 02/17/18 0354  WBC 9.9 9.3 8.1  HGB 10.8* 10.5* 9.5*  HCT 32.3* 31.2* 28.6*  PLT 325 283 245   BMET Recent Labs    02/15/18 2013 02/15/18 2104 02/16/18 0534  NA 141 141 140  K 4.3 4.1 3.8  CL 107 103 107  CO2 28  --  25  GLUCOSE 131* 134* 100*  BUN 12 10 11   CREATININE 0.72 0.70 0.56  CALCIUM 8.6*  --  8.5*   LFT Recent Labs    02/15/18 2013  PROT 6.3*  ALBUMIN 3.2*  AST 20  ALT 15  ALKPHOS 45  BILITOT 0.1*   PT/INR Recent Labs    02/15/18 2013  LABPROT 13.4  INR 1.03   Ct Angio Abd/pel W And/or Wo Contrast  Result Date: 02/15/2018 CLINICAL DATA:  47 year old female with concern for GI bleed. Abdominal pain. History of ovarian cyst. EXAM: CTA ABDOMEN AND PELVIS wITHOUT AND WITH CONTRAST TECHNIQUE: Multidetector CT imaging of the abdomen and pelvis was performed using the standard protocol during  bolus administration of intravenous contrast. Multiplanar reconstructed images and MIPs were obtained and reviewed to evaluate the vascular anatomy. CONTRAST:  <See Chart> ISOVUE-370 IOPAMIDOL (ISOVUE-370) INJECTION 76% COMPARISON:  Pelvic ultrasound dated 01/10/2018 and CT of the abdomen pelvis dated 01/05/2018. FINDINGS: VASCULAR Aorta: Normal caliber aorta without aneurysm, dissection, vasculitis or significant stenosis. Celiac: Patent without evidence of aneurysm, dissection, vasculitis or significant stenosis. SMA: Patent without evidence of aneurysm, dissection, vasculitis or significant stenosis. Renals: Both renal arteries are patent without evidence of aneurysm, dissection, vasculitis, fibromuscular dysplasia or significant stenosis. IMA: Patent without evidence of aneurysm, dissection, vasculitis or significant stenosis. Inflow: Patent without evidence of aneurysm, dissection, vasculitis or significant stenosis. Proximal Outflow: Bilateral common femoral and visualized portions of the superficial and profunda femoral arteries are patent without evidence of aneurysm, dissection, vasculitis or significant stenosis. Veins: No obvious venous abnormality within the limitations of this arterial phase study. Review of the MIP images confirms the above findings. NON-VASCULAR Lower chest: The visualized lung bases are clear. No intra-abdominal free air or free fluid. Hepatobiliary: No focal liver abnormality is seen. No gallstones, gallbladder wall thickening, or biliary dilatation. Pancreas: Unremarkable. No pancreatic ductal dilatation or surrounding inflammatory changes. Spleen: Normal in size without focal abnormality. Adrenals/Urinary Tract: The adrenal glands are unremarkable. There is no hydronephrosis or nephrolithiasis on  either side. Subcentimeter bilateral renal hypodense lesions are too small to characterize. The visualized ureters and urinary bladder appear unremarkable. Stomach/Bowel: The previously  seen cecal mass is not well seen on today's CT. Evaluation however is limited in the absence of oral contrast. An area of nodularity at the base of the cecum noted (series 10, image 173) which may represent cecal fold or stool content however, a mass or residual mass is not entirely excluded. There is no bowel obstruction or active inflammation. There is no evidence of active GI bleed. The appendix is normal. Lymphatic: Multiple pericecal lymph nodes measure up to 8 mm in short axis. No adenopathy. Reproductive: Hysterectomy. Complex/multi-septated and multi cystic left adnexal mass measures 11 x 6 cm. Further evaluation with MRI without and with contrast is recommended. Other: None Musculoskeletal: No acute or significant osseous findings. IMPRESSION: VASCULAR No acute findings. NON-VASCULAR 1. No acute intra-abdominal or pelvic pathology. No evidence of active GI bleed. 2. Previously seen cecal mass is not visualized with certainty on today's exam and may have been resected. Correlation with surgical history recommended. Evaluation however is limited in the absence of oral contrast. 3. Left adnexal cystic mass as seen on the prior CT and ultrasound. Further characterization with MRI without and with contrast is recommended. Electronically Signed   By: Anner Crete M.D.   On: 02/15/2018 22:52   Assessment / Plan: *Post-polypectomy bleed:  Had a 5 cm polyp removed via EMR and hot snare from the cecum at [facility] on 7/15.  Had a 5 cm ulcer at polypectomy site without active bleeding seen on colonoscopy 7/17. *ABLA:  Hgb down another gram today, but no sign of bleeding.  **Discussed with patient.  She is uncomfortable going home today.  Will ask hospitalist to keep her until AM and recheck Hgb at that time.    LOS: 0 days   Laban Emperor. Kaytlyn Din  02/17/2018, 8:53 AM

## 2018-02-18 ENCOUNTER — Other Ambulatory Visit: Payer: Self-pay

## 2018-02-18 DIAGNOSIS — K922 Gastrointestinal hemorrhage, unspecified: Secondary | ICD-10-CM

## 2018-02-18 DIAGNOSIS — N390 Urinary tract infection, site not specified: Secondary | ICD-10-CM

## 2018-02-18 LAB — CBC
HCT: 27.9 % — ABNORMAL LOW (ref 36.0–46.0)
Hemoglobin: 9.4 g/dL — ABNORMAL LOW (ref 12.0–15.0)
MCH: 31.3 pg (ref 26.0–34.0)
MCHC: 33.7 g/dL (ref 30.0–36.0)
MCV: 93 fL (ref 78.0–100.0)
Platelets: 248 10*3/uL (ref 150–400)
RBC: 3 MIL/uL — ABNORMAL LOW (ref 3.87–5.11)
RDW: 13.1 % (ref 11.5–15.5)
WBC: 10.7 10*3/uL — ABNORMAL HIGH (ref 4.0–10.5)

## 2018-02-18 MED ORDER — NITROFURANTOIN MONOHYD MACRO 100 MG PO CAPS
100.0000 mg | ORAL_CAPSULE | Freq: Two times a day (BID) | ORAL | Status: DC
  Administered 2018-02-18: 100 mg via ORAL
  Filled 2018-02-18: qty 1

## 2018-02-18 MED ORDER — NITROFURANTOIN MONOHYD MACRO 100 MG PO CAPS: 100.0000 mg | ORAL_CAPSULE | Freq: Two times a day (BID) | ORAL | 0 refills | Status: AC

## 2018-02-18 MED ORDER — PNEUMOCOCCAL VAC POLYVALENT 25 MCG/0.5ML IJ INJ
0.5000 mL | INJECTION | INTRAMUSCULAR | Status: AC
  Administered 2018-02-18: 0.5 mL via INTRAMUSCULAR
  Filled 2018-02-18: qty 0.5

## 2018-02-18 NOTE — Progress Notes (Signed)
     [facility] Progress Note  CC:  Post-polypectomy bleed  Subjective:  Feels better than she did yesterday.  Still feels a little bloated, is passing flatus.  Also now being treated for UTI.  No BM or sign of bleeding.  Objective:  Vital signs in last 24 hours: Temp:  [98 F (36.7 C)-99.1 F (37.3 C)] 98 F (36.7 C) (07/19 9470) Pulse Rate:  [71-93] 71 (07/19 0608) Resp:  [0-23] 14 (07/19 0608) BP: (117-164)/(58-90) 118/67 (07/19 0608) SpO2:  [95 %-100 %] 99 % (07/19 0608) Last BM Date: 02/17/18 General:  Alert, Well-developed, in NAD Heart:  Regular rate and rhythm; no murmurs Pulm:  CTAB.  No increased WOB. Abdomen:  Soft, non-distended.  BS present.  Non-tender. Extremities:  Without edema. Neurologic:  Alert and oriented x 4;  grossly normal neurologically. Psych:  Alert and cooperative. Normal mood and affect.  Intake/Output from previous day: 07/18 0701 - 07/19 0700 In: 1487.5 [P.O.:720; I.V.:767.5] Out: 200 [Urine:200]  Lab Results: Recent Labs    02/16/18 0804 02/17/18 0354 02/17/18 1248 02/18/18 0445  WBC 9.3 8.1  --  10.7*  HGB 10.5* 9.5* 10.0* 9.4*  HCT 31.2* 28.6* 29.4* 27.9*  PLT 283 245  --  248   BMET Recent Labs    02/15/18 2013 02/15/18 2104 02/16/18 0534  NA 141 141 140  K 4.3 4.1 3.8  CL 107 103 107  CO2 28  --  25  GLUCOSE 131* 134* 100*  BUN 12 10 11   CREATININE 0.72 0.70 0.56  CALCIUM 8.6*  --  8.5*   LFT Recent Labs    02/15/18 2013  PROT 6.3*  ALBUMIN 3.2*  AST 20  ALT 15  ALKPHOS 45  BILITOT 0.1*   PT/INR Recent Labs    02/15/18 2013  LABPROT 13.4  INR 1.03   Assessment / Plan: *Post-polypectomy bleed: Had a 5 cm polyp removed via EMR and hot snare from the cecum at [facility] on 7/15 (showed HGD--recommendations will be made by [facility] regarding surveillance).  Had a 5 cm ulcer at polypectomy site without active bleeding seen on colonoscopy 7/17. *ABLA: Hgb fluctuating by about a gram or so but no sign of  bleeding.  **Patient will eat a soft diet and then if she does well can be discharged home today.  There is an order in for a repeat CBC in one week by our office so she can come then to have that drawn.    LOS: 1 day   Laban Emperor. Khira Cudmore  02/18/2018, 9:58 AM

## 2018-02-18 NOTE — Discharge Summary (Signed)
Physician Discharge Summary  [name] ZPH:[id number] DOB: 17-Mar-1971 DOA: 02/15/2018  PCP: Darreld Mclean, MD  Admit date: 02/15/2018 Discharge date: 02/18/2018  Admitted From: Home Disposition:  Home  Recommendations for Outpatient Follow-up:  1. Follow up with PCP in 1 week 2. Please follow up on the following pending results: Urine culture result 3. Follow up with [facility] for repeat Hgb next week 4. Follow up with [facility] Dr. Stephanie Acre regarding surveillance for colonoscopy findings   Discharge Condition: Stable CODE STATUS: Full  Diet recommendation: Regular diet   Brief/Interim Summary: [name] a 47 y.o.femalewithhistory of hypothyroidism and ovarian cyst who had a recent CAT scan which showed polyp in the colon for which patient had colonoscopy done on Monday 3 days [facility]. She underwent polypectomyat that time. Last evening,when patient moved her bowels she had a large amount of bloody bowel movement.She had another episode following which patient became more dizzy and near syncopal.Case was discussed with GI and patient admitted for colonoscopy.  She underwent colonoscopy with [facility] Dr. Hilarie Fredrickson on 7/17 which revealed nonbleeding ulcer at polypectomy site. She was monitored for recurrent bleeding. Hgb remained stable. UA revealed UTI and she was started on macrobid. Urine culture pending at time of discharge.   Discharge Diagnoses:  Principal Problem:   Acute lower GI bleeding Active Problems:   Hypothyroidism   Acute blood loss anemia   Acute GI bleeding   H/O colonoscopy with polypectomy   Acute lower UTI   Discharge Instructions  Discharge Instructions    Call MD for:   Complete by:  As directed    Recurrent GI bleeding   Call MD for:  difficulty breathing, headache or visual disturbances   Complete by:  As directed    Call MD for:  extreme fatigue   Complete by:  As directed    Call MD for:  hives   Complete by:  As directed     Call MD for:  persistant dizziness or light-headedness   Complete by:  As directed    Call MD for:  persistant nausea and vomiting   Complete by:  As directed    Call MD for:  severe uncontrolled pain   Complete by:  As directed    Call MD for:  temperature >100.4   Complete by:  As directed    Diet general   Complete by:  As directed    Discharge instructions   Complete by:  As directed    You were cared for by a hospitalist during your hospital stay. If you have any questions about your discharge medications or the care you received while you were in the hospital after you are discharged, you can call the unit and ask to speak with the hospitalist on call if the hospitalist that took care of you is not available. Once you are discharged, your primary care physician will handle any further medical issues. Please note that NO REFILLS for any discharge medications will be authorized once you are discharged, as it is imperative that you return to your primary care physician (or establish a relationship with a primary care physician if you do not have one) for your aftercare needs so that they can reassess your need for medications and monitor your lab values.   Increase activity slowly   Complete by:  As directed      Allergies as of 02/18/2018      Reactions   Levothyroxine Sodium Anxiety, Palpitations   Levothyroxine  Palpitations      Medication List    STOP taking these medications   SUPREP BOWEL PREP KIT 17.5-3.13-1.6 GM/177ML Soln Generic drug:  Na Sulfate-K Sulfate-Mg Sulf     TAKE these medications   cholecalciferol 1000 units tablet Commonly known as:  VITAMIN D Take 2,000 Units by mouth daily.   Melatonin Gummies 2.5 MG Chew Chew 1 tablet by mouth daily.   multivitamin with minerals tablet Take 1 tablet by mouth daily. Reported on 07/24/2015   nitrofurantoin (macrocrystal-monohydrate) 100 MG capsule Commonly known as:  MACROBID Take 1 capsule (100 mg total) by mouth  every 12 (twelve) hours for 5 days.   thyroid 30 MG tablet Commonly known as:  ARMOUR THYROID take 2 and 1/2 tablets by mouth once daily BEFORE BREAKFAST   vitamin B-12 1000 MCG tablet Commonly known as:  CYANOCOBALAMIN Take 1,000 mcg by mouth daily.      Follow-up Information    Copland, Gay Filler, MD. Schedule an appointment as soon as possible for a visit in 1 week(s).   Specialty:  Family Medicine Why:  Follow up urine culture result  Contact information: [address] [place] [address] [phone]        [facility]. Go to.   Specialty:  Gastroenterology Why:  For repeat hemoglobin blood work lab. No appointment needed.  Contact information: [place] [phone]       Thelma Comp, MD. Call.   Specialty:  Gastroenterology Why:  For follow up on colonoscopy findings  Contact information: [address] [employer] [place] [address] [phone]          Allergies  Allergen Reactions  . Levothyroxine Sodium Anxiety and Palpitations  . Levothyroxine Palpitations    Consultations:  GI    Procedures/Studies: Ct Angio Abd/pel W And/or Wo Contrast  Result Date: 02/15/2018 CLINICAL DATA:  47 year old female with concern for GI bleed. Abdominal pain. History of ovarian cyst. EXAM: CTA ABDOMEN AND PELVIS wITHOUT AND WITH CONTRAST TECHNIQUE: Multidetector CT imaging of the abdomen and pelvis was performed using the standard protocol during bolus administration of intravenous contrast. Multiplanar reconstructed images and MIPs were obtained and reviewed to evaluate the vascular anatomy. CONTRAST:  <See Chart> ISOVUE-370 IOPAMIDOL (ISOVUE-370) INJECTION 76% COMPARISON:  Pelvic ultrasound dated 01/10/2018 and CT of the abdomen pelvis dated 01/05/2018. FINDINGS: VASCULAR Aorta: Normal caliber aorta without aneurysm, dissection, vasculitis or significant stenosis.  Celiac: Patent without evidence of aneurysm, dissection, vasculitis or significant stenosis. SMA: Patent without evidence of aneurysm, dissection, vasculitis or significant stenosis. Renals: Both renal arteries are patent without evidence of aneurysm, dissection, vasculitis, fibromuscular dysplasia or significant stenosis. IMA: Patent without evidence of aneurysm, dissection, vasculitis or significant stenosis. Inflow: Patent without evidence of aneurysm, dissection, vasculitis or significant stenosis. Proximal Outflow: Bilateral common femoral and visualized portions of the superficial and profunda femoral arteries are patent without evidence of aneurysm, dissection, vasculitis or significant stenosis. Veins: No obvious venous abnormality within the limitations of this arterial phase study. Review of the MIP images confirms the above findings. NON-VASCULAR Lower chest: The visualized lung bases are clear. No intra-abdominal free air or free fluid. Hepatobiliary: No focal liver abnormality is seen. No gallstones, gallbladder wall thickening, or biliary dilatation. Pancreas: Unremarkable. No pancreatic ductal dilatation or surrounding inflammatory changes. Spleen: Normal in size without focal abnormality. Adrenals/Urinary Tract: The adrenal glands are unremarkable. There is no hydronephrosis or nephrolithiasis on either side. Subcentimeter  bilateral renal hypodense lesions are too small to characterize. The visualized ureters and urinary bladder appear unremarkable. Stomach/Bowel: The previously seen cecal mass is not well seen on today's CT. Evaluation however is limited in the absence of oral contrast. An area of nodularity at the base of the cecum noted (series 10, image 173) which may represent cecal fold or stool content however, a mass or residual mass is not entirely excluded. There is no bowel obstruction or active inflammation. There is no evidence of active GI bleed. The appendix is normal. Lymphatic:  Multiple pericecal lymph nodes measure up to 8 mm in short axis. No adenopathy. Reproductive: Hysterectomy. Complex/multi-septated and multi cystic left adnexal mass measures 11 x 6 cm. Further evaluation with MRI without and with contrast is recommended. Other: None Musculoskeletal: No acute or significant osseous findings. IMPRESSION: VASCULAR No acute findings. NON-VASCULAR 1. No acute intra-abdominal or pelvic pathology. No evidence of active GI bleed. 2. Previously seen cecal mass is not visualized with certainty on today's exam and may have been resected. Correlation with surgical history recommended. Evaluation however is limited in the absence of oral contrast. 3. Left adnexal cystic mass as seen on the prior CT and ultrasound. Further characterization with MRI without and with contrast is recommended. Electronically Signed   By: Anner Crete M.D.   On: 02/15/2018 22:52    Colonoscopy 7/17 Impression:       - 5 cm post-polypectomy ulcer in the cecum. No                            active bleeding.                           - The examination was otherwise normal on direct                            and retroflexion views.                           - No specimens collected.   Discharge Exam: Vitals:   02/17/18 2155 02/18/18 0608  BP: 117/60 118/67  Pulse: 76 71  Resp: 17 14  Temp: 99.1 F (37.3 C) 98 F (36.7 C)  SpO2: 100% 99%    General: Pt is alert, awake, not in acute distress Cardiovascular: RRR, S1/S2 +, no rubs, no gallops Respiratory: CTA bilaterally, no wheezing, no rhonchi Abdominal: Soft, NT, mildly distended, bowel sounds + Extremities: no edema, no cyanosis    The results of significant diagnostics from this hospitalization (including imaging, microbiology, ancillary and laboratory) are listed below for reference.     Microbiology: Recent Results (from the past 240 hour(s))  MRSA PCR Screening     Status: None   Collection Time: 02/16/18  2:41 AM  Result  Value Ref Range Status   MRSA by PCR NEGATIVE NEGATIVE Final    Comment:        The GeneXpert MRSA Assay (FDA approved for NASAL specimens only), is one component of a comprehensive MRSA colonization surveillance program. It is not intended to diagnose MRSA infection nor to guide or monitor treatment for MRSA infections. Performed at [facility], [place] [address] [place],  [address]      Labs: BNP (last 3 results) No results for input(s): BNP in the last 8760 hours.  Basic Metabolic Panel: Recent Labs  Lab 02/15/18 2013 02/15/18 2104 02/16/18 0534  NA 141 141 140  K 4.3 4.1 3.8  CL 107 103 107  CO2 28  --  25  GLUCOSE 131* 134* 100*  BUN 12 10 11   CREATININE 0.72 0.70 0.56  CALCIUM 8.6*  --  8.5*   Liver Function Tests: Recent Labs  Lab 02/15/18 2013  AST 20  ALT 15  ALKPHOS 45  BILITOT 0.1*  PROT 6.3*  ALBUMIN 3.2*   No results for input(s): LIPASE, AMYLASE in the last 168 hours. No results for input(s): AMMONIA in the last 168 hours. CBC: Recent Labs  Lab 02/15/18 2013  02/16/18 0015 02/16/18 0327 02/16/18 0804 02/17/18 0354 02/17/18 1248 02/18/18 0445  WBC 10.8*  --  11.0* 9.9 9.3 8.1  --  10.7*  NEUTROABS 8.2*  --   --   --   --   --   --   --   HGB 11.1*   < > 10.9* 10.8* 10.5* 9.5* 10.0* 9.4*  HCT 33.0*   < > 32.2* 32.3* 31.2* 28.6* 29.4* 27.9*  MCV 93.5  --  92.3 93.4 93.7 93.8  --  93.0  PLT 312  --  304 325 283 245  --  248   < > = values in this interval not displayed.   Cardiac Enzymes: No results for input(s): CKTOTAL, CKMB, CKMBINDEX, TROPONINI in the last 168 hours. BNP: Invalid input(s): POCBNP CBG: Recent Labs  Lab 02/16/18 0612 02/16/18 1757 02/16/18 2306 02/17/18 0521 02/17/18 1120  GLUCAP 93 99 79 70 103*   D-Dimer No results for input(s): DDIMER in the last 72 hours. Hgb A1c No results for input(s): HGBA1C in the last 72 hours. Lipid Profile No results for input(s): CHOL, HDL, LDLCALC,  TRIG, CHOLHDL, LDLDIRECT in the last 72 hours. Thyroid function studies No results for input(s): TSH, T4TOTAL, T3FREE, THYROIDAB in the last 72 hours.  Invalid input(s): FREET3 Anemia work up No results for input(s): VITAMINB12, FOLATE, FERRITIN, TIBC, IRON, RETICCTPCT in the last 72 hours. Urinalysis    Component Value Date/Time   COLORURINE YELLOW 02/17/2018 1646   APPEARANCEUR CLEAR 02/17/2018 1646   LABSPEC 1.010 02/17/2018 1646   PHURINE 6.0 02/17/2018 1646   GLUCOSEU NEGATIVE 02/17/2018 1646   HGBUR LARGE (A) 02/17/2018 1646   BILIRUBINUR NEGATIVE 02/17/2018 1646   BILIRUBINUR negative 12/30/2017 1756   BILIRUBINUR neg 07/24/2014 1453   [place] 02/17/2018 1646   PROTEINUR NEGATIVE 02/17/2018 1646   UROBILINOGEN 0.2 12/30/2017 1756   NITRITE NEGATIVE 02/17/2018 1646   LEUKOCYTESUR LARGE (A) 02/17/2018 1646   Sepsis Labs Invalid input(s): PROCALCITONIN,  WBC,  LACTICIDVEN Microbiology Recent Results (from the past 240 hour(s))  MRSA PCR Screening     Status: None   Collection Time: 02/16/18  2:41 AM  Result Value Ref Range Status   MRSA by PCR NEGATIVE NEGATIVE Final    Comment:        The GeneXpert MRSA Assay (FDA approved for NASAL specimens only), is one component of a comprehensive MRSA colonization surveillance program. It is not intended to diagnose MRSA infection nor to guide or monitor treatment for MRSA infections. Performed at [facility], [place] [address] [place], [place] [address]      Patient was seen and examined on the day of discharge and was found to be in stable condition. Time coordinating discharge: 35 minutes including assessment and coordination of care, as well as examination of  the patient.   SIGNED:  Dessa Phi, DO [facility] Pager [phone]  If 7PM-7AM, please contact night-coverage [email or web address] Password [facility] 02/18/2018, 9:48 AM

## 2018-02-20 ENCOUNTER — Telehealth: Payer: Self-pay | Admitting: Family Medicine

## 2018-02-20 ENCOUNTER — Encounter: Payer: Self-pay | Admitting: Family Medicine

## 2018-02-20 LAB — URINE CULTURE
Culture: >=100000 — AB
Special Requests: NORMAL

## 2018-02-20 NOTE — Telephone Encounter (Signed)
Message to pt about her UTI It looks like she had a urine collected on 7/16 and macrobid rx on 7/19

## 2018-02-21 ENCOUNTER — Other Ambulatory Visit (INDEPENDENT_AMBULATORY_CARE_PROVIDER_SITE_OTHER): Payer: BLUE CROSS/BLUE SHIELD

## 2018-02-21 ENCOUNTER — Telehealth: Payer: Self-pay | Admitting: Gastroenterology

## 2018-02-21 DIAGNOSIS — K922 Gastrointestinal hemorrhage, unspecified: Secondary | ICD-10-CM | POA: Diagnosis not present

## 2018-02-21 LAB — CBC WITH DIFFERENTIAL/PLATELET
Basophils Absolute: 0.1 10*3/uL (ref 0.0–0.1)
Basophils Relative: 0.9 % (ref 0.0–3.0)
Eosinophils Absolute: 0.2 10*3/uL (ref 0.0–0.7)
Eosinophils Relative: 1.8 % (ref 0.0–5.0)
HCT: 32.6 % — ABNORMAL LOW (ref 36.0–46.0)
Hemoglobin: 11.1 g/dL — ABNORMAL LOW (ref 12.0–15.0)
Lymphocytes Relative: 28.7 % (ref 12.0–46.0)
Lymphs Abs: 2.4 10*3/uL (ref 0.7–4.0)
MCHC: 34.1 g/dL (ref 30.0–36.0)
MCV: 92.9 fl (ref 78.0–100.0)
Monocytes Absolute: 0.5 10*3/uL (ref 0.1–1.0)
Monocytes Relative: 5.9 % (ref 3.0–12.0)
Neutro Abs: 5.3 10*3/uL (ref 1.4–7.7)
Neutrophils Relative %: 62.7 % (ref 43.0–77.0)
Platelets: 378 10*3/uL (ref 150.0–400.0)
RBC: 3.5 Mil/uL — ABNORMAL LOW (ref 3.87–5.11)
RDW: 13 % (ref 11.5–15.5)
WBC: 8.5 10*3/uL (ref 4.0–10.5)

## 2018-02-22 NOTE — Telephone Encounter (Signed)
Result of the hemaglobin shared with the patient which is improved from 4 days ago. She thanks me for my call.

## 2018-02-24 NOTE — Progress Notes (Signed)
[place] at [facility] [address], [place],  [address] [phone]  Date:  02/28/2018   Name:  [name]   DOB:  15-Mar-1971   MRN:  [id number]  PCP:  Darreld Mclean, MD    Chief Complaint: Hospitalization Follow-up (GI bleed)   History of Present Illness:  [name] Patient is a 47 y.o. very pleasant female patient who presents with the following:  Here today to follow-up from recent hospital admission for acute GI bleed following polypectomy at [place] date: 02/15/2018 Discharge date: 02/18/2018  Recommendations for Outpatient Follow-up:  1. Follow up with PCP in 1 week 2. Please follow up on the following pending results: Urine culture result 3. Follow up with [facility] for repeat Hgb next week 4. Follow up with [facility] Dr. Stephanie Acre regarding surveillance for colonoscopy findings   Brief/Interim Summary: Ambre Hageris a 47 y.o.femalewithhistory of hypothyroidism and ovarian cyst who had a recent CAT scan which showed polyp in the colon for which patient had colonoscopy done on Monday 3 days [facility]. She underwent polypectomyat that time. Last evening,when patient moved her bowels she had a large amount of bloody bowel movement.She had another episode following which patient became more dizzy and near syncopal.Case was discussed with GI and patient admitted for colonoscopy. She underwent colonoscopy with [facility] Dr. Hilarie Fredrickson on 7/17 which revealed nonbleeding ulcer at polypectomy site. She was monitored for recurrent bleeding. Hgb remained stable. UA revealed UTI and she was started on macrobid. Urine culture pending at time of discharge.   Discharge Diagnoses:  Principal Problem:   Acute lower GI bleeding Active Problems:   Hypothyroidism   Acute blood loss anemia   Acute GI bleeding   H/O colonoscopy with polypectomy   Acute lower UTI  She does not think that she got a blood transfusion   She is feeling much better and is trying to take it easy She is going back to [facility] on 8/15 to discuss the next step - her colon path was benign. They also are planning to remove her ovary soon   No more blood in her stools, no black or tarry stools No vomiting No other bleeding Her appetite is lower but she does not have any pain with eating.  She has been told to avoid fiber by GI and is craving vegetables, she is not sure when she can add these back  We already followed up on her UTI from inpt stay and she was treated appropriately She is concerned about dx of pre-diabetes and would like to see a nutritionist to discuss, this is certainly fine with me   BP Readings from Last 3 Encounters:  02/28/18 118/80  02/18/18 119/64  01/24/18 128/85   Lab Results  Component Value Date   TSH 4.34 06/07/2017   She is ready to go back to work, the majority is computer type work but her job does entail some physical work as she teaches people restraining holds, 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 H/O colonoscopy with polypectomy   . Acute lower GI bleeding 02/16/2018  . Acute blood loss anemia 02/16/2018  .  Acute GI bleeding 02/16/2018  . Abnormal CT of the abdomen 01/12/2018  . Colon cancer screening 01/12/2018  . Obesity, unspecified 07/07/2013  . Hypothyroidism 09/27/2011    Past Medical History:  Diagnosis Date  . Anxiety   . Benign ovarian tumor July 2012   17 pound benign tumor- removed  . Depression   . Thyroid disease     Past Surgical History:  Procedure Laterality Date  . ABDOMINAL HYSTERECTOMY  2012   one ovary remains- due to giant benign ovarian cyst (17 lbs)  . COLONOSCOPY WITH PROPOFOL N/A 02/16/2018   Procedure: COLONOSCOPY WITH  PROPOFOL;  Surgeon: Jerene Bears, MD;  Location: [facility];  Service: Gastroenterology;  Laterality: N/A;  . OVARIAN CYST REMOVAL      Social History   Tobacco Use  . Smoking status: Never Smoker  . Smokeless tobacco: Never Used  Substance Use Topics  . Alcohol use: No    Alcohol/week: 0.0 oz    Comment: socially  . Drug use: No    Family History  Problem Relation Age of Onset  . Heart disease Father   . Stroke Father   . Heart attack Father   . Hyperlipidemia Father   . Hypertension Father   . Heart disease Paternal Grandfather   . Heart attack Paternal Grandfather   . COPD Mother   . Congestive Heart Failure Mother   . Gallbladder disease Maternal Grandmother   . Stroke Paternal Grandmother   . Colon cancer Neg Hx   . Stomach cancer Neg Hx     Allergies  Allergen Reactions  . Levothyroxine Sodium Anxiety and Palpitations  . Levothyroxine Palpitations    Medication list has been reviewed and updated.  Current Outpatient Medications on File Prior to Visit  Medication Sig Dispense Refill  . cholecalciferol (VITAMIN D) 1000 units tablet Take 2,000 Units by mouth daily.    . Melatonin Gummies 2.5 MG CHEW Chew 1 tablet by mouth daily.     . Multiple Vitamins-Minerals (MULTIVITAMIN WITH MINERALS) tablet Take 1 tablet by mouth daily. Reported on 07/24/2015    . thyroid (ARMOUR THYROID) 30 MG tablet take 2 and 1/2 tablets by mouth once daily BEFORE BREAKFAST 225 tablet 3   No current facility-administered medications on file prior to visit.     Review of Systems:  As per HPI- otherwise negative. No further bleeding Still tired and a bit weak but getting steadily better She notes mild epigastric discomfort off and on  Physical Examination: Vitals:   02/28/18 1136  BP: 118/80  Pulse: 86  Resp: 16  Temp: 98.9 F (37.2 C)  SpO2: 98%   Vitals:   02/28/18 1136  Weight: 270 lb (122.5 kg)  Height: 5\' 4"  (1.626 m)   Body mass index is 46.35 kg/m. Ideal  Body Weight: Weight in (lb) to have BMI = 25: 145.3  GEN: WDWN, NAD, Non-toxic, A & O x 3, obese, looks well  HEENT: Atraumatic, Normocephalic. Neck supple. No masses, No LAD.  Bilateral TM wnl, oropharynx normal.  PEERL,EOMI.   Ears and Nose: No external deformity. CV: RRR, No M/G/R. No JVD. No thrill. No extra heart sounds. PULM: CTA B, no wheezes, crackles, rhonchi. No retractions. No resp. distress. No accessory muscle use. ABD: S, minimal epigastric TTP, otherwise no tenderness, ND, +BS. No rebound. No HSM. EXTR: No c/c/e NEURO Normal gait.  PSYCH: Normally interactive. Conversant. Not depressed or anxious appearing.  Calm demeanor.    Assessment and Plan: Hospital discharge  follow-up  Pre-diabetes - Plan: Amb ref to Medical Nutrition Therapy-MNT  Gastrointestinal hemorrhage, unspecified gastrointestinal hemorrhage type  Acute blood loss anemia  Cyst of ovary, unspecified laterality  Following up from recent GI bleed following polypectomy.  She is doing well and her hg is trending up- just rechecked by GI last week, hg 11.1 UTI also treated successfully She is continuing to follow-up at [facility] for her GI path report and also plans for removing her ovarian cyst Suspect mild gastritis from recent event, suggested an H2 blocker or PPI OTC for a couple of weeks for her mild epigastric pain Referral to nutrition as per her request Plan to visit in 3-4 months to check A1c and TSH  Signed Lamar Blinks, MD

## 2018-02-28 ENCOUNTER — Encounter: Payer: Self-pay | Admitting: Family Medicine

## 2018-02-28 ENCOUNTER — Ambulatory Visit (INDEPENDENT_AMBULATORY_CARE_PROVIDER_SITE_OTHER): Payer: BLUE CROSS/BLUE SHIELD | Admitting: Family Medicine

## 2018-02-28 VITALS — BP 118/80 | HR 86 | Temp 98.9°F | Resp 16 | Ht 64.0 in | Wt 270.0 lb

## 2018-02-28 DIAGNOSIS — D62 Acute posthemorrhagic anemia: Secondary | ICD-10-CM

## 2018-02-28 DIAGNOSIS — K922 Gastrointestinal hemorrhage, unspecified: Secondary | ICD-10-CM | POA: Diagnosis not present

## 2018-02-28 DIAGNOSIS — Z09 Encounter for follow-up examination after completed treatment for conditions other than malignant neoplasm: Secondary | ICD-10-CM

## 2018-02-28 DIAGNOSIS — N83209 Unspecified ovarian cyst, unspecified side: Secondary | ICD-10-CM

## 2018-02-28 DIAGNOSIS — R7303 Prediabetes: Secondary | ICD-10-CM | POA: Diagnosis not present

## 2018-02-28 NOTE — Patient Instructions (Addendum)
It was great to see you today- I am so happy to see you doing ok!  Please ask your GI doc about increasing your fiber intake- I think this is likely ok but I would double check with them first  You can go back to work but let's have you take it easy for the next 2 weeks I did place a generic referral to nutrition- however if you know the specific person you want to see let me know and I can set this up for you  Let's plan to visit and check thyroid/ A1c in 3-4 months

## 2018-03-01 ENCOUNTER — Telehealth: Payer: Self-pay | Admitting: *Deleted

## 2018-03-01 NOTE — Telephone Encounter (Signed)
Received Colonoscopy results from [facility]; forwarded to provider/SLS 07/30

## 2018-03-14 ENCOUNTER — Inpatient Hospital Stay: Payer: BLUE CROSS/BLUE SHIELD | Attending: Genetic Counselor | Admitting: Genetics

## 2018-03-14 ENCOUNTER — Inpatient Hospital Stay: Payer: BLUE CROSS/BLUE SHIELD

## 2018-03-14 ENCOUNTER — Encounter: Payer: Self-pay | Admitting: Genetics

## 2018-03-14 DIAGNOSIS — Z860100 Personal history of colon polyps, unspecified: Secondary | ICD-10-CM | POA: Insufficient documentation

## 2018-03-14 NOTE — Progress Notes (Signed)
REFERRING PROVIDER: Mauri Pole, MD [place], [place] [address]  PRIMARY PROVIDER:  Copland, Gay Filler, MD  PRIMARY REASON FOR VISIT:  1. Personal history of colonic polyps    HISTORY OF PRESENT ILLNESS:   Ms. Padmore, a 47 y.o. female, was seen for a [place] cancer genetics consultation at the request of Dr. Silverio Decamp due to a personal history of high grade dysplasia of colon polyps.  Ms. Lei presents to clinic today to discuss the possibility of a hereditary predisposition to cancer, genetic testing, and to further clarify her future cancer risks, as well as potential cancer risks for family members.   In 2019, at the age of 89, Ms. Sippel was found to have a 66m tubulovillous adenoma of the cecum and a polyps that was benign colon mucosa with lymphoid aggregate under pathology.   HORMONAL RISK FACTORS:  Menarche was at age 47  First live birth at age N/A.  OCP use for approximately 0 years.  Ovaries intact: 1 ovary intact, 1 removed due to large ovarian cyst.  Ms. HLabinehas a new ovarian cyst and is planning to have her other ovary removed for this soon.  Hysterectomy: yes.  Menopausal status: premenopausal.  HRT use: 0 years. Colonoscopy: yes; see above. Mammogram within the last year: yes.  Past Medical History:  Diagnosis Date  . Anxiety   . Benign ovarian tumor July 2012   17 pound benign tumor- removed  . Depression   . Personal history of colonic polyps   . Thyroid disease     Past Surgical History:  Procedure Laterality Date  . ABDOMINAL HYSTERECTOMY  2012   one ovary remains- due to giant benign ovarian cyst (17 lbs)  . COLONOSCOPY WITH PROPOFOL N/A 02/16/2018   Procedure: COLONOSCOPY WITH PROPOFOL;  Surgeon: PJerene Bears MD;  Location: [facility];  Service: Gastroenterology;  Laterality: N/A;  . OVARIAN CYST REMOVAL      Social History   Socioeconomic History  . Marital status: DSoil scientist   Spouse name: Not on file  .  Number of children: Not on file  . Years of education: Not on file  . Highest education level: Not on file  Occupational History  . Occupation: MEducation officer, community Social Needs  . Financial resource strain: Not on file  . Food insecurity:    Worry: Not on file    Inability: Not on file  . Transportation needs:    Medical: Not on file    Non-medical: Not on file  Tobacco Use  . Smoking status: Never Smoker  . Smokeless tobacco: Never Used  Substance and Sexual Activity  . Alcohol use: No    Alcohol/week: 0.0 standard drinks    Comment: socially  . Drug use: No  . Sexual activity: Yes  Lifestyle  . Physical activity:    Days per week: Not on file    Minutes per session: Not on file  . Stress: Not on file  Relationships  . Social connections:    Talks on phone: Not on file    Gets together: Not on file    Attends religious service: Not on file    Active member of club or organization: Not on file    Attends meetings of clubs or organizations: Not on file    Relationship status: Not on file  Other Topics Concern  . Not on file  Social History Narrative   Significant other   Education: [employer]  Exercise: Yes     FAMILY HISTORY:  We obtained a detailed, 4-generation family history.  Significant diagnoses are listed below: Family History  Problem Relation Age of Onset  . Heart disease Father   . Stroke Father   . Heart attack Father   . Hyperlipidemia Father   . Hypertension Father   . Colon polyps Father        benign  . Heart disease Paternal Grandfather   . Heart attack Paternal Grandfather   . COPD Mother   . Congestive Heart Failure Mother   . Lung cancer Mother        highly suspected, waiting on definitive bx 03/14/18  . Gallbladder disease Maternal Grandmother   . Stroke Paternal Grandmother   . Colon cancer Neg Hx   . Stomach cancer Neg Hx    Ms. Laramie has an adopted 60 year-old son.  Ms. Wiatrek has a 85 year-old full sister who has 2 children  and a 72 year-old maternal half sister with no history of cancer and 3 children. She does not know if this older sister has had a colonoscopy.   Ms. Fleites father: 31, no history of cancer, he has had some benign colon polyps.  Paternal Aunts/Uncles: 1 paternal aunt, 2 paternal uncles with no history of cancer Paternal cousins: no history of cancer Paternal grandfather: died in his 48's due to heart tattack Paternal grandmother:died in her 21's, no history of cancer  Ms. Ramer's mother: 44, recently had bx for highly suspected lung cancer, they are awaiting the results from the biopsy.  She has a history of smoking Maternal Aunts/Uncles: 1 maternal aunt in her 25's with no history of cancer Maternal cousins: no history of cancer Maternal grandfather: unk Maternal grandmother:no history of cancer  Ms. Antonucci is unaware of previous family history of genetic testing for hereditary cancer risks. Patient's maternal ancestors are of N. European descent, and paternal ancestors are of N. European descent. There is no reported Ashkenazi Jewish ancestry. There is no known consanguinity.  GENETIC COUNSELING ASSESSMENT: Tatyanna Cronk is a 47 y.o. female with a personal history which is somewhat suggestive of a Hereditary Cancer Predisposition Syndrome. We, therefore, discussed and recommended the following at today's visit.   DISCUSSION: We reviewed the characteristics, features and inheritance patterns of hereditary cancer syndromes. We also discussed genetic testing, including the appropriate family members to test, the process of testing, insurance coverage and turn-around-time for results. We discussed the implications of a negative, positive and/or variant of uncertain significant result. We offered Ms. Vahle the option to pursue genetic testing for the Multi-Cancers Panel.  The Multi-Cancer Panel offered by [facility] includes sequencing and/or deletion duplication testing of the following 84 genes:  AIP,ALK, APC, ATM, AXIN2,BAP1,  BARD1, BLM, BMPR1A, BRCA1, BRCA2, BRIP1, CASR, CDC73, CDH1, CDK4, CDKN1B, CDKN1C, CDKN2A (p14ARF), CDKN2A (p16INK4a), CEBPA, CHEK2, CTNNA1, DICER1, DIS3L2, EGFR (c.2369C>T, p.Thr790Met variant only), EPCAM (Deletion/duplication testing only), FH, FLCN, GATA2, GPC3, GREM1 (Promoter region deletion/duplication testing only), HOXB13 (c.251G>A, p.Gly84Glu), HRAS, KIT, MAX, MEN1, MET, MITF (c.952G>A, p.Glu318Lys variant only), MLH1, MSH2, MSH3, MSH6, MUTYH, NBN, NF1, NF2, NTHL1, PALB2, PDGFRA, PHOX2B, PMS2, POLD1, POLE, POT1, PRKAR1A, PTCH1, PTEN, RAD50, RAD51C, RAD51D, RB1, RECQL4, RET, RUNX1, SDHAF2, SDHA (sequence changes only), SDHB, SDHC, SDHD, SMAD4, SMARCA4, SMARCB1, SMARCE1, STK11, SUFU, TERC, TERT, TMEM127, TP53, TSC1, TSC2, VHL, WRN and WT1.    We discussed that only 5-10% of cancers are associated with a Hereditary Cancer Predisposition Syndrome.  The most common hereditary cancer syndrome  associated with colon cancer is Lynch Syndrome.  Lynch Syndrome is caused by mutations in the genes: MLH1, MSH2, MSH6, PMS2 and EPCAM.  This syndrome increases the risk for colon, uterine, ovarian and stomach cancers, as well as others.  Families with Lynch Syndrome tend to have multiple family members with these cancers, typically diagnosed under age 67, and diagnoses in multiple generations.    We discussed that there are several other genes that are associated with an increased risk for colon cancer and increased polyp burden (MUTYH, APC, POLE, CHEK2, etc.) We also dicussed that there are many genes that cause many different types of cancer risks.    We discussed that if she is found to have a mutation in one of these genes, it may impact future medical management recommendations such as increased cancer screenings and consideration of risk reducing surgeries.  A positive result could also have implications for the patient's family members.  A Negative result would mean we were  unable to identify a hereditary component to her cancer, but does not rule out the possibility of a hereditary basis for her cancer.  There could be mutations that are undetectable by current technology, or in genes not yet tested or identified to increase cancer risk.    We discussed the potential to find a Variant of Uncertain Significance or VUS.  These are variants that have not yet been identified as pathogenic or benign, and it is unknown if this variant is associated with increased cancer risk or if this is a normal finding.  Most VUS's are reclassified to benign or likely benign.   It should not be used to make medical management decisions. With time, we suspect the lab will determine the significance of any VUS's identified if any.   We discussed that some people do not want to undergo genetic testing due to fear of genetic discrimination.  A federal law called the Genetic Information Non-Discrimination Act (GINA) of 2008 helps protect individuals against genetic discrimination based on their genetic test results.  It impacts both health insurance and employment.  For health insurance, it protects against increased premiums, being kicked off insurance or being forced to take a test in order to be insured.  For employment it protects against hiring, firing and promoting decisions based on genetic test results.  Health status due to a cancer diagnosis is not protected under GINA.  This law does not protect life insurance, disability insurance, or other types of insurance. We discussed that some people do not want to undergo genetic testing due to fear of genetic discrimination.  A federal law called the Genetic Information Non-Discrimination Act (GINA) of 2008 helps protect individuals against genetic discrimination based on their genetic test results.  It impacts both health insurance and employment.  For health insurance, it protects against increased premiums, being kicked off insurance or being forced  to take a test in order to be insured.  For employment it protects against hiring, firing and promoting decisions based on genetic test results.  Health status due to a cancer diagnosis is not protected under GINA.  This law does not protect life insurance, disability insurance, or other types of insurance.   We discussed with Ms. Phillippi that while she had a large tubulovillus adenoma at 27, it was not colon cancer and therefore she does not meet national criteria for genetic testing. Therefore we did not recommend genetic testing today.   Ms. Calip would like to think about genetic testing, but has  declined today.  She will reach out to me if she would like to proceed in the future.   PLAN:  Ms. Holzman did not wish to pursue genetic testing at today's visit. We understand this decision, and remain available to coordinate genetic testing at any time in the future. We; therefore, recommend Ms. Roh continue to follow the cancer screening guidelines given by her primary healthcare provider.  Lastly, we encouraged Ms. Rinehimer to remain in contact with cancer genetics annually so that we can continuously update the family history and inform her of any changes in cancer genetics and testing that may be of benefit for this family.   Ms.  Hlad questions were answered to her satisfaction today. Our contact information was provided should additional questions or concerns arise. Thank you for the referral and allowing [place] to share in the care of your patient.   Tana Felts, MS, [facility] Certified Genetic Counselor [email or web address] phone: [phone]  The patient was seen for a total of 40 minutes in face-to-face genetic counseling.

## 2018-03-17 DIAGNOSIS — E039 Hypothyroidism, unspecified: Secondary | ICD-10-CM | POA: Diagnosis not present

## 2018-03-17 DIAGNOSIS — N949 Unspecified condition associated with female genital organs and menstrual cycle: Secondary | ICD-10-CM | POA: Diagnosis not present

## 2018-03-17 DIAGNOSIS — R140 Abdominal distension (gaseous): Secondary | ICD-10-CM | POA: Diagnosis not present

## 2018-03-17 DIAGNOSIS — Z79899 Other long term (current) drug therapy: Secondary | ICD-10-CM | POA: Diagnosis not present

## 2018-03-17 DIAGNOSIS — Z6841 Body Mass Index (BMI) 40.0 and over, adult: Secondary | ICD-10-CM | POA: Diagnosis not present

## 2018-03-17 DIAGNOSIS — R109 Unspecified abdominal pain: Secondary | ICD-10-CM | POA: Diagnosis not present

## 2018-03-17 DIAGNOSIS — R1900 Intra-abdominal and pelvic swelling, mass and lump, unspecified site: Secondary | ICD-10-CM | POA: Diagnosis not present

## 2018-03-30 DIAGNOSIS — F320 Major depressive disorder, single episode, mild: Secondary | ICD-10-CM | POA: Diagnosis not present

## 2018-04-05 ENCOUNTER — Telehealth: Payer: Self-pay

## 2018-04-05 DIAGNOSIS — Z5331 Laparoscopic surgical procedure converted to open procedure: Secondary | ICD-10-CM | POA: Diagnosis not present

## 2018-04-05 DIAGNOSIS — N83202 Unspecified ovarian cyst, left side: Secondary | ICD-10-CM | POA: Diagnosis not present

## 2018-04-05 DIAGNOSIS — R1900 Intra-abdominal and pelvic swelling, mass and lump, unspecified site: Secondary | ICD-10-CM | POA: Diagnosis not present

## 2018-04-05 DIAGNOSIS — N949 Unspecified condition associated with female genital organs and menstrual cycle: Secondary | ICD-10-CM | POA: Diagnosis not present

## 2018-04-05 DIAGNOSIS — K660 Peritoneal adhesions (postprocedural) (postinfection): Secondary | ICD-10-CM | POA: Diagnosis not present

## 2018-04-05 DIAGNOSIS — E039 Hypothyroidism, unspecified: Secondary | ICD-10-CM | POA: Diagnosis not present

## 2018-04-05 DIAGNOSIS — G4733 Obstructive sleep apnea (adult) (pediatric): Secondary | ICD-10-CM | POA: Diagnosis not present

## 2018-04-05 DIAGNOSIS — D282 Benign neoplasm of uterine tubes and ligaments: Secondary | ICD-10-CM | POA: Diagnosis not present

## 2018-04-05 DIAGNOSIS — Z90710 Acquired absence of both cervix and uterus: Secondary | ICD-10-CM | POA: Diagnosis not present

## 2018-04-05 DIAGNOSIS — N135 Crossing vessel and stricture of ureter without hydronephrosis: Secondary | ICD-10-CM | POA: Diagnosis not present

## 2018-04-05 DIAGNOSIS — D271 Benign neoplasm of left ovary: Secondary | ICD-10-CM | POA: Diagnosis not present

## 2018-04-05 NOTE — Telephone Encounter (Signed)
Per Dr Ilona Sorrel at [facility] - made appt for staple removal with Joylene John NP for 9-12 10:30 am and Dr Aldean Ast on October 8th at 1:15 am. No other needs per her at this time- per MD,  pt is post-op -recovering now and Dr Ilona Sorrel will inform pt of her upcoming appts.

## 2018-04-07 ENCOUNTER — Telehealth: Payer: Self-pay

## 2018-04-07 DIAGNOSIS — D271 Benign neoplasm of left ovary: Secondary | ICD-10-CM | POA: Diagnosis not present

## 2018-04-07 NOTE — Telephone Encounter (Signed)
Outgoing call to patient per Joylene John NP to reschedule patient to 2:30 pm on 9-12 instead of 10:30 am- pt agreeable and voiced understanding. No other needs per pt at this time.

## 2018-04-09 DIAGNOSIS — N390 Urinary tract infection, site not specified: Secondary | ICD-10-CM | POA: Diagnosis not present

## 2018-04-14 ENCOUNTER — Ambulatory Visit: Payer: BLUE CROSS/BLUE SHIELD | Admitting: Gynecologic Oncology

## 2018-04-14 ENCOUNTER — Encounter: Payer: Self-pay | Admitting: Gynecologic Oncology

## 2018-04-14 ENCOUNTER — Inpatient Hospital Stay: Payer: BLUE CROSS/BLUE SHIELD | Attending: Genetic Counselor | Admitting: Gynecologic Oncology

## 2018-04-14 VITALS — BP 146/83 | HR 83 | Temp 98.8°F | Resp 18 | Ht 64.0 in | Wt 274.0 lb

## 2018-04-14 DIAGNOSIS — Z90710 Acquired absence of both cervix and uterus: Secondary | ICD-10-CM | POA: Insufficient documentation

## 2018-04-14 DIAGNOSIS — Z4802 Encounter for removal of sutures: Secondary | ICD-10-CM

## 2018-04-14 DIAGNOSIS — D271 Benign neoplasm of left ovary: Secondary | ICD-10-CM

## 2018-04-14 DIAGNOSIS — Z90722 Acquired absence of ovaries, bilateral: Secondary | ICD-10-CM

## 2018-04-14 NOTE — Patient Instructions (Signed)
Continue with your post-op restrictions.  Please call our office at [phone] for any questions or concerns.  Plan to follow up with Dr. Fermin Schwab as scheduled or sooner if needed.  The steri strips on your abdomen will stay in place for about one week.  After that time, you can remove them if they are still present.  Call for any wound issues such as wound separation, drainage, redness.

## 2018-04-14 NOTE — Progress Notes (Signed)
Follow Up Note: Gyn-Onc  [name] 47 y.o. female  CC:  Chief Complaint  Patient presents with  . Visit for suture removal    post-op [facility]    HPI: [name] is a 47 year old female initially seen at [facility] at the request of Dr. Garwin Brothers for a pelvic mass noted on CT scan.  Her history includes having a large mucinous cystadenoma removed in 2012.  In the summer of 2019, she developed pelvic fullness and was found to have a new 10cm left-sided pelvic mass. Imaging was also notable for soft-tissue mass in the cecum. She was evaluated by GI and had a colonoscopy with a large precancerous polyp found on examination. Following the procedure, she experienced postoperative bleeding from the polypectomy requiring ICU admission and blood transfusion.  CT Scan in June 2019: Impression  1. Multiloculated cystic mass within the left hemipelvis with enhancing internal septations, concerning for primary ovarian epithelial neoplasm. Pelvic endovaginal ultrasound may be helpful for further evaluation. 2. Soft tissue mass within the cecum adjacent to the ileocecal valve. Correlation with colonoscopy is recommended.  On 04/05/18, she underwent diagnostic laparoscopy, unilateral salpingo-oophorectomy, conversion to open for exploratory laparotomy and left ureterolysis with Dr. Fermin Schwab. Final Diagnosis  A: Fallopian tube and ovary, left, salpingo-oophorectomy  - Multiloculated serous cystadenoma.  - No borderline elements or malignancy identified.  - Fallopian tube with focal chronic inflammation.  A: Pelvic washing  No malignant cells identified   Interval History: She presents today with her friend for post-op follow up and staple removal from [facility].  She states she has been doing well since surgery.  Tolerating diet with no nausea or emesis reported.  Bowels and bladder functioning without difficulty.  No concerns voiced about her abdominal incision.  She has been wearing her binder for support.   Ambulating without difficulty.  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  Minimal pain reported.  No concerns voiced.    Review of Systems: Constitutional: Feels well. No fever, chills, early satiety, change in appetite or weight.  Cardiovascular: No chest pain, shortness of breath, or edema.  Pulmonary: No cough or wheeze.  Gastrointestinal: No nausea, vomiting, or diarrhea. No bright red blood per rectum or change in bowel movement.  Genitourinary: No frequency, urgency, or dysuria. No vaginal bleeding or discharge.  Musculoskeletal: No myalgia or joint pain. Neurologic: No weakness, numbness, or change in gait.  Psychology: No depression, anxiety, or insomnia.  Current Meds:  Outpatient Encounter Medications as of 04/14/2018  Medication Sig  . acetaminophen (TYLENOL) 325 MG tablet   . cholecalciferol (VITAMIN D) 1000 units tablet Take 2,000 Units by mouth daily.  . ciprofloxacin (CIPRO) 500 MG tablet TK 1 T PO  BID FOR 7 DAYS  . ibuprofen (ADVIL,MOTRIN) 600 MG tablet Take by mouth.  . Melatonin Gummies 2.5 MG CHEW Chew 1 tablet by mouth daily.   . Multiple Vitamins-Minerals (MULTIVITAMIN WITH MINERALS) tablet Take 1 tablet by mouth daily. Reported on 07/24/2015  . thyroid (ARMOUR THYROID) 30 MG tablet take 2 and 1/2 tablets by mouth once daily BEFORE BREAKFAST  . oxyCODONE (OXY IR/ROXICODONE) 5 MG immediate release tablet TAKE 1 TABLET BY MOUTH EVERY 4 HOURS AS NEEDED FOR PAIN FOR UP TO 5 DAYS   No facility-administered encounter medications on file as of 04/14/2018.     Allergy:  Allergies  Allergen Reactions  . Levothyroxine Sodium Anxiety and Palpitations  . Levothyroxine Palpitations  Social Hx:   Social History   Socioeconomic History  . Marital status: Soil scientist    Spouse name: Not on file  . Number of children: Not on file  . Years of education: Not on file  . Highest education level: Not on file  Occupational  History  . Occupation: Education officer, community  Social Needs  . Financial resource strain: Not on file  . Food insecurity:    Worry: Not on file    Inability: Not on file  . Transportation needs:    Medical: Not on file    Non-medical: Not on file  Tobacco Use  . Smoking status: Never Smoker  . Smokeless tobacco: Never Used  Substance and Sexual Activity  . Alcohol use: No    Alcohol/week: 0.0 standard drinks    Comment: socially  . Drug use: No  . Sexual activity: Yes  Lifestyle  . Physical activity:    Days per week: Not on file    Minutes per session: Not on file  . Stress: Not on file  Relationships  . Social connections:    Talks on phone: Not on file    Gets together: Not on file    Attends religious service: Not on file    Active member of club or organization: Not on file    Attends meetings of clubs or organizations: Not on file    Relationship status: Not on file  . Intimate partner violence:    Fear of current or ex partner: Not on file    Emotionally abused: Not on file    Physically abused: Not on file    Forced sexual activity: Not on file  Other Topics Concern  . Not on file  Social History Narrative   Significant other   Education: College   Exercise: Yes    Past Surgical Hx:  Past Surgical History:  Procedure Laterality Date  . ABDOMINAL HYSTERECTOMY  2012   one ovary remains- due to giant benign ovarian cyst (17 lbs)  . COLONOSCOPY WITH PROPOFOL N/A 02/16/2018   Procedure: COLONOSCOPY WITH PROPOFOL;  Surgeon: Jerene Bears, MD;  Location: [facility];  Service: Gastroenterology;  Laterality: N/A;  . OVARIAN CYST REMOVAL      Past Medical Hx:  Past Medical History:  Diagnosis Date  . Anxiety   . Benign ovarian tumor July 2012   17 pound benign tumor- removed  . Depression   . Personal history of colonic polyps   . Thyroid disease     Family Hx:  Family History  Problem Relation Age of Onset  . Heart disease Father   . Stroke  Father   . Heart attack Father   . Hyperlipidemia Father   . Hypertension Father   . Colon polyps Father        benign  . Heart disease Paternal Grandfather   . Heart attack Paternal Grandfather   . COPD Mother   . Congestive Heart Failure Mother   . Lung cancer Mother        highly suspected, waiting on definitive bx 03/14/18  . Gallbladder disease Maternal Grandmother   . Stroke Paternal Grandmother   . Colon cancer Neg Hx   . Stomach cancer Neg Hx     Vitals:  Blood pressure (!) 146/83, pulse 83, temperature 98.8 F (37.1 C), temperature source Oral, resp. rate 18, height 5\' 4"  (1.626 m), weight 274 lb (124.3 kg), SpO2 98 %.  Physical Exam:  General: Well developed, well  nourished female in no acute distress. Alert and oriented x 3.  Cardiovascular: Regular rate and rhythm. S1 and S2 normal.  Lungs: Clear to auscultation bilaterally. No wheezes/crackles/rhonchi noted.  Skin: No rashes or lesions present. Abdomen: Abdomen soft, round, non-tender and obese. Active bowel sounds in all quadrants. No evidence of a fluid wave or abdominal masses but limited due to habitus. 34 staples removed from the midline incision without difficulty.  Incision remained intact with no evidence of separation.  No erythema or drainage noted.  Steris strips applied.  Incision reassessed after patient ambulated and remained intact.  Extremities: No bilateral cyanosis, edema, or clubbing.   Assessment/Plan: 47 year old female s/p diagnostic laparoscopy, unilateral salpingo-oophorectomy, conversion to open for exploratory laparotomy and left ureterolysis on 04/05/18 at [facility] for a serous cystadenoma.  She is doing well post-operatively.  Reportable signs and symptoms reviewed.  She is advised to monitor the incision for erythema, drainage, or separation and contact the office for any needs.  She is advised to follow up as scheduled with Dr. Fermin Schwab or sooner if needed.     Dorothyann Gibbs, NP 04/14/2018,  4:22 PM

## 2018-04-21 ENCOUNTER — Telehealth: Payer: Self-pay

## 2018-04-21 NOTE — Telephone Encounter (Signed)
Pt with no BM for 2 days.  She has some abdominal  pain pain as well. She has been using a stool softener and Miralax with no relief. She is to try a fleets enema  And follow up with a Doculax suppository.  Suggested with glove and lubricant to see if she can gently disimpact her self. She is to call the office ~1330 with an update as she may need to come in to be seen by the NP. Melissa per Joylene John, NP.

## 2018-04-21 NOTE — Telephone Encounter (Signed)
Pt called stating that  She used the fleet enema with good results.  She did not have to use the Doculax suppository.

## 2018-05-02 ENCOUNTER — Telehealth: Payer: Self-pay | Admitting: *Deleted

## 2018-05-02 NOTE — Telephone Encounter (Signed)
Called and spoke with the patient regarding her appt on 10/8. Moved appt from 1:15pm to 12:45pm.

## 2018-05-10 ENCOUNTER — Inpatient Hospital Stay: Payer: BLUE CROSS/BLUE SHIELD

## 2018-05-10 ENCOUNTER — Encounter: Payer: Self-pay | Admitting: Gynecologic Oncology

## 2018-05-10 ENCOUNTER — Inpatient Hospital Stay: Payer: BLUE CROSS/BLUE SHIELD | Attending: Genetic Counselor | Admitting: Gynecology

## 2018-05-10 ENCOUNTER — Encounter: Payer: Self-pay | Admitting: Gynecology

## 2018-05-10 VITALS — BP 157/75 | HR 84 | Temp 98.7°F | Resp 20 | Ht 64.0 in | Wt 274.2 lb

## 2018-05-10 DIAGNOSIS — D271 Benign neoplasm of left ovary: Secondary | ICD-10-CM | POA: Insufficient documentation

## 2018-05-10 DIAGNOSIS — Z90710 Acquired absence of both cervix and uterus: Secondary | ICD-10-CM | POA: Insufficient documentation

## 2018-05-10 DIAGNOSIS — Z90721 Acquired absence of ovaries, unilateral: Secondary | ICD-10-CM | POA: Diagnosis not present

## 2018-05-10 DIAGNOSIS — R3915 Urgency of urination: Secondary | ICD-10-CM | POA: Insufficient documentation

## 2018-05-10 NOTE — Progress Notes (Signed)
Consult Note: Gyn-Onc   [name] 47 y.o. female  No chief complaint on file.   Assessment : Status post left salpingo-oophorectomy April 05, 2018 very final pathology showed a serous cystadenoma.  Patient had an unconjugated postoperative course at this juncture is given the okay to return to full levels of activity.  We will obtain a urine culture to be certain she does not have a urinary tract infection to account for her urgency.  She will return to the care of Dr. Servando Salina.    Interval History: Patient returns today for postoperative check having undergone left salpingo-oophorectomy for a serous cystadenoma at [facility] April 05, 2018.  Surgery was accomplished with the laparoscope.  However because of uncertainty regarding the status of the left colon and left ureter, she underwent exploratory laparotomy.  We found no surgical complications.  The patient's wound healed well.  Patient today reports that she is doing well although she still somewhat fatigued.  She is no longer using her pain medication.  Functional status is improving significantly.  Her appetite is good.  She does have some urinary urgency that she previously experience.  Denies any dysuria, flank pain, fever chills, or dysuria.  She says she still has some right lower quadrant pain similar to what she experienced after a colonic resection of her polyp in the right colon.  HPI: Patient was referred by Dr. Servando Salina regarding management of a pelvic mass detected on CT scan performed to evaluate symptoms of pelvic fullness.  The CT also showed a mass in the cecum patient underwent colonoscopic resection of a benign polyp..  Patient had a past history of a large mucinous cystadenoma in 2012.  Review of Systems:10 point review of systems is negative except as noted in interval history.   Vitals: There were no vitals taken for this visit.  Physical Exam: General : The patient is a healthy woman  in no acute distress.  HEENT: normocephalic, extraoccular movements normal; neck is supple without thyromegally  Lynphnodes: Supraclavicular and inguinal nodes not enlarged  Abdomen: Soft, non-tender, no ascites, no organomegally, no masses, no hernias, midline incision is healing well. Pelvic:  EGBUS: Normal female  Vagina: Normal, no lesions  Urethra and Bladder: Normal, non-tender  Cervix: Surgically absent  Uterus: Surgically absent  Bi-manual examination: Non-tender; no adenxal masses or nodularity  Rectal: Deferred Lower extremities: No edema or varicosities. Normal range of motion      Allergies  Allergen Reactions  . Levothyroxine Sodium Anxiety and Palpitations  . Levothyroxine Palpitations    Past Medical History:  Diagnosis Date  . Anxiety   . Benign ovarian tumor July 2012   17 pound benign tumor- removed  . Depression   . Personal history of colonic polyps   . Thyroid disease     Past Surgical History:  Procedure Laterality Date  . ABDOMINAL HYSTERECTOMY  2012   one ovary remains- due to giant benign ovarian cyst (17 lbs)  . COLONOSCOPY WITH PROPOFOL N/A 02/16/2018   Procedure: COLONOSCOPY WITH PROPOFOL;  Surgeon: Jerene Bears, MD;  Location: [facility];  Service: Gastroenterology;  Laterality: N/A;  . OVARIAN CYST REMOVAL      Current Outpatient Medications  Medication Sig Dispense Refill  . acetaminophen (TYLENOL) 325 MG tablet   1  . cholecalciferol (VITAMIN D) 1000 units tablet Take 2,000 Units by mouth daily.    . ciprofloxacin (CIPRO) 500 MG tablet TK 1 T PO  BID FOR 7 DAYS  0  .  ibuprofen (ADVIL,MOTRIN) 600 MG tablet Take by mouth.    . Melatonin Gummies 2.5 MG CHEW Chew 1 tablet by mouth daily.     . Multiple Vitamins-Minerals (MULTIVITAMIN WITH MINERALS) tablet Take 1 tablet by mouth daily. Reported on 07/24/2015    . oxyCODONE (OXY IR/ROXICODONE) 5 MG immediate release tablet TAKE 1 TABLET BY MOUTH EVERY 4 HOURS AS NEEDED FOR PAIN FOR UP TO 5  DAYS  0  . thyroid (ARMOUR THYROID) 30 MG tablet take 2 and 1/2 tablets by mouth once daily BEFORE BREAKFAST 225 tablet 3   No current facility-administered medications for this visit.     Social History   Socioeconomic History  . Marital status: Soil scientist    Spouse name: Not on file  . Number of children: Not on file  . Years of education: Not on file  . Highest education level: Not on file  Occupational History  . Occupation: Education officer, community  Social Needs  . Financial resource strain: Not on file  . Food insecurity:    Worry: Not on file    Inability: Not on file  . Transportation needs:    Medical: Not on file    Non-medical: Not on file  Tobacco Use  . Smoking status: Never Smoker  . Smokeless tobacco: Never Used  Substance and Sexual Activity  . Alcohol use: No    Alcohol/week: 0.0 standard drinks    Comment: socially  . Drug use: No  . Sexual activity: Yes  Lifestyle  . Physical activity:    Days per week: Not on file    Minutes per session: Not on file  . Stress: Not on file  Relationships  . Social connections:    Talks on phone: Not on file    Gets together: Not on file    Attends religious service: Not on file    Active member of club or organization: Not on file    Attends meetings of clubs or organizations: Not on file    Relationship status: Not on file  . Intimate partner violence:    Fear of current or ex partner: Not on file    Emotionally abused: Not on file    Physically abused: Not on file    Forced sexual activity: Not on file  Other Topics Concern  . Not on file  Social History Narrative   Significant other   Education: College   Exercise: Yes    Family History  Problem Relation Age of Onset  . Heart disease Father   . Stroke Father   . Heart attack Father   . Hyperlipidemia Father   . Hypertension Father   . Colon polyps Father        benign  . Heart disease Paternal Grandfather   . Heart attack Paternal  Grandfather   . COPD Mother   . Congestive Heart Failure Mother   . Lung cancer Mother        highly suspected, waiting on definitive bx 03/14/18  . Gallbladder disease Maternal Grandmother   . Stroke Paternal Grandmother   . Colon cancer Neg Hx   . Stomach cancer Neg Hx       Marti Sleigh, MD 05/10/2018, 12:46 PM

## 2018-05-10 NOTE — Patient Instructions (Signed)
Plan to follow up with Dr. Garwin Brothers for your well woman care.  You can return to work on May 18, 2018.  Please call for any questions or concerns.  We will contact you with the results of your urine sample from today.

## 2018-05-11 ENCOUNTER — Telehealth: Payer: Self-pay | Admitting: *Deleted

## 2018-05-11 LAB — URINE CULTURE

## 2018-05-11 NOTE — Telephone Encounter (Signed)
I called patient to inform her of her urine culture results.  Per Joylene John, NP the urine was likely contaminated during collection, could have been from bacteria from her skin when wiping, if symptoms persist we can try to check another urine specimen.  Patient states " I am not having any symptoms at this time".  I told the patient to continue to monitor for urinary frequency,or pressure and to let our office know if these symptoms persist.  Patient verbalized understanding.

## 2018-05-30 ENCOUNTER — Telehealth: Payer: Self-pay

## 2018-05-30 NOTE — Telephone Encounter (Signed)
Incoming call from patient requesting faxed letter to her employer to return to work full-time, their fax is [phone] (full-time on October 23rd attention to human resources "no restrictions")  Ok per Joylene John NP and will fax to her human resources. No other needs per pt at this time.

## 2018-06-17 ENCOUNTER — Other Ambulatory Visit: Payer: Self-pay | Admitting: Family Medicine

## 2018-06-17 DIAGNOSIS — E039 Hypothyroidism, unspecified: Secondary | ICD-10-CM

## 2018-07-06 ENCOUNTER — Telehealth: Payer: Self-pay | Admitting: Gastroenterology

## 2018-07-06 ENCOUNTER — Other Ambulatory Visit: Payer: Self-pay | Admitting: Family Medicine

## 2018-07-06 DIAGNOSIS — Z1231 Encounter for screening mammogram for malignant neoplasm of breast: Secondary | ICD-10-CM

## 2018-07-06 NOTE — Telephone Encounter (Signed)
Complications after polypectomy. Please advise.

## 2018-07-07 NOTE — Telephone Encounter (Signed)
Called back patient.  She states that she did not have a good experience at [facility] during the procedure and also post colonoscopy follow-up.  Patient is requesting to be referred to [facility].  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  Due for surveillance colonoscopy . Please send referral to Dr. Mont Dutton at [place]

## 2018-07-07 NOTE — Telephone Encounter (Signed)
Records faxed to [facility]. GI. Copied and pasted the pathology report.
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
## 2018-07-11 NOTE — Telephone Encounter (Signed)
Confirmed the records were received.

## 2018-08-07 DIAGNOSIS — N390 Urinary tract infection, site not specified: Secondary | ICD-10-CM | POA: Diagnosis not present

## 2018-08-18 ENCOUNTER — Ambulatory Visit
Admission: RE | Admit: 2018-08-18 | Discharge: 2018-08-18 | Disposition: A | Discharge status: Home / Self Care | Payer: BLUE CROSS/BLUE SHIELD | Source: Ambulatory Visit | Attending: Family Medicine | Admitting: Family Medicine

## 2018-08-18 DIAGNOSIS — Z1231 Encounter for screening mammogram for malignant neoplasm of breast: Secondary | ICD-10-CM

## 2018-08-19 DIAGNOSIS — D122 Benign neoplasm of ascending colon: Secondary | ICD-10-CM | POA: Diagnosis not present

## 2018-08-25 ENCOUNTER — Telehealth: Payer: Self-pay | Admitting: Family Medicine

## 2018-08-25 ENCOUNTER — Other Ambulatory Visit: Payer: Self-pay

## 2018-08-25 ENCOUNTER — Encounter: Payer: Self-pay | Admitting: Family Medicine

## 2018-08-25 ENCOUNTER — Ambulatory Visit (INDEPENDENT_AMBULATORY_CARE_PROVIDER_SITE_OTHER): Payer: BLUE CROSS/BLUE SHIELD | Admitting: Family Medicine

## 2018-08-25 ENCOUNTER — Emergency Department (HOSPITAL_BASED_OUTPATIENT_CLINIC_OR_DEPARTMENT_OTHER)
Admission: EM | Admit: 2018-08-25 | Discharge: 2018-08-25 | Disposition: A | Discharge status: Home / Self Care | Payer: BLUE CROSS/BLUE SHIELD | Attending: Emergency Medicine | Admitting: Emergency Medicine

## 2018-08-25 ENCOUNTER — Ambulatory Visit: Payer: Self-pay | Admitting: *Deleted

## 2018-08-25 ENCOUNTER — Emergency Department (HOSPITAL_BASED_OUTPATIENT_CLINIC_OR_DEPARTMENT_OTHER): Payer: BLUE CROSS/BLUE SHIELD

## 2018-08-25 ENCOUNTER — Encounter (HOSPITAL_BASED_OUTPATIENT_CLINIC_OR_DEPARTMENT_OTHER): Payer: Self-pay | Admitting: *Deleted

## 2018-08-25 VITALS — BP 146/92 | HR 88 | Temp 98.3°F | Resp 18 | Ht 64.0 in | Wt 279.0 lb

## 2018-08-25 DIAGNOSIS — R079 Chest pain, unspecified: Secondary | ICD-10-CM | POA: Insufficient documentation

## 2018-08-25 DIAGNOSIS — I10 Essential (primary) hypertension: Secondary | ICD-10-CM

## 2018-08-25 DIAGNOSIS — R0789 Other chest pain: Secondary | ICD-10-CM

## 2018-08-25 DIAGNOSIS — E039 Hypothyroidism, unspecified: Secondary | ICD-10-CM | POA: Diagnosis not present

## 2018-08-25 DIAGNOSIS — R030 Elevated blood-pressure reading, without diagnosis of hypertension: Secondary | ICD-10-CM

## 2018-08-25 DIAGNOSIS — Z79899 Other long term (current) drug therapy: Secondary | ICD-10-CM | POA: Diagnosis not present

## 2018-08-25 DIAGNOSIS — R0602 Shortness of breath: Secondary | ICD-10-CM | POA: Diagnosis not present

## 2018-08-25 LAB — COMPREHENSIVE METABOLIC PANEL
ALT: 32 U/L (ref 0–44)
AST: 28 U/L (ref 15–41)
Albumin: 4.3 g/dL (ref 3.5–5.0)
Alkaline Phosphatase: 67 U/L (ref 38–126)
Anion gap: 7 (ref 5–15)
BUN: 13 mg/dL (ref 6–20)
CO2: 25 mmol/L (ref 22–32)
Calcium: 9 mg/dL (ref 8.9–10.3)
Chloride: 103 mmol/L (ref 98–111)
Creatinine, Ser: 0.56 mg/dL (ref 0.44–1.00)
GFR calc Af Amer: >60 mL/min (ref >60)
GFR calc non Af Amer: >60 mL/min (ref >60)
Glucose, Bld: 90 mg/dL (ref 70–99)
Potassium: 3.4 mmol/L — ABNORMAL LOW (ref 3.5–5.1)
Sodium: 135 mmol/L (ref 135–145)
Total Bilirubin: 0.7 mg/dL (ref 0.3–1.2)
Total Protein: 8 g/dL (ref 6.5–8.1)

## 2018-08-25 LAB — CBC WITH DIFFERENTIAL/PLATELET
Abs Immature Granulocytes: 0.03 10*3/uL (ref 0.00–0.07)
Basophils Absolute: 0.1 10*3/uL (ref 0.0–0.1)
Basophils Relative: 1 %
Eosinophils Absolute: 0.1 10*3/uL (ref 0.0–0.5)
Eosinophils Relative: 1 %
HCT: 40.4 % (ref 36.0–46.0)
Hemoglobin: 13.3 g/dL (ref 12.0–15.0)
Immature Granulocytes: 0 %
Lymphocytes Relative: 30 %
Lymphs Abs: 2.6 10*3/uL (ref 0.7–4.0)
MCH: 28.5 pg (ref 26.0–34.0)
MCHC: 32.9 g/dL (ref 30.0–36.0)
MCV: 86.5 fL (ref 80.0–100.0)
Monocytes Absolute: 0.7 10*3/uL (ref 0.1–1.0)
Monocytes Relative: 8 %
Neutro Abs: 5.1 10*3/uL (ref 1.7–7.7)
Neutrophils Relative %: 60 %
Platelets: 318 10*3/uL (ref 150–400)
RBC: 4.67 MIL/uL (ref 3.87–5.11)
RDW: 14.3 % (ref 11.5–15.5)
WBC: 8.6 10*3/uL (ref 4.0–10.5)
nRBC: 0 % (ref 0.0–0.2)

## 2018-08-25 LAB — TSH: TSH: 4.382 u[IU]/mL (ref 0.350–4.500)

## 2018-08-25 LAB — TROPONIN I: Troponin I: <0.03 ng/mL (ref <0.03)

## 2018-08-25 MED ORDER — LISINOPRIL 10 MG PO TABS: 10.0000 mg | ORAL_TABLET | Freq: Every day | ORAL | 5 refills | Status: DC

## 2018-08-25 NOTE — Telephone Encounter (Signed)
Patient came in for appointment. EKG preformed. Patient was sent to ED.

## 2018-08-25 NOTE — Telephone Encounter (Signed)
Patient was seen in the emergency room for chest pain, and was cleared.  I will set her up for an outpatient stress test

## 2018-08-25 NOTE — Telephone Encounter (Signed)
Pt called with complaints of" not feeling great for the past several weeks"; she was seen at [facility] 08/19/2018 and her BP was high: her most recent readings taken at home with cuff on left upper arm: 08/25/2018 @ 0415 139/99 08/24/2018 @ 1830 162/114                  @ 0600 146/104 the pt says that she has had left chest tightness radiating to her left armpit; the pt also says that she can feel her heart pounding; recommendations made per nurse triage protocol; pt offered and accepted appointment with Dr Lamar Blinks, [place], 08/25/2018 at 1000; she verbalized understanding; will route to office for notifcation.   Reason for Disposition . Systolic BP  >= 559 OR Diastolic >= 741  Answer Assessment - Initial Assessment Questions 1. BLOOD PRESSURE: "What is the blood pressure?" "Did you take at least two measurements 5 minutes apart?"     139/99 @ 0415 08/25/2018; 162/114 @ 1830 08/24/2018  2. ONSET: "When did you take your blood pressure?"     08/25/2018 and 08/24/2018 3. HOW: "How did you obtain the blood pressure?" (e.g., visiting nurse, automatic home BP monitor)     Home cuff left upper arn 4. HISTORY: "Do you have a history of high blood pressure?"     no 5. MEDICATIONS: "Are you taking any medications for blood pressure?" "Have you missed any doses recently?"     n/a 6. OTHER SYMPTOMS: "Do you have any symptoms?" (e.g., headache, chest pain, blurred vision, difficulty breathing, weakness)     Feels heart pounding; left chest tightness that radiates into her armpit  7. PREGNANCY: "Is there any chance you are pregnant?" "When was your last menstrual period?"     No hysterectomy  Protocols used: HIGH BLOOD PRESSURE-A-AH

## 2018-08-25 NOTE — Discharge Instructions (Signed)
Make sure you are eating foods high in potassium.  Start the blood pressure medication.  Return if you have worsening pain, unable to catch your breath or passing out.

## 2018-08-25 NOTE — Progress Notes (Signed)
[facility] [place], [place], [place] [address] [phone]  Date:  08/25/2018   Name:  [name]   DOB:  03-03-1971   MRN:  [id number]  PCP:  Darreld Mclean, MD    Chief Complaint: Hypertension (shortness of breath, chest pain?, tightness in chest, tingling in toes)   History of Present Illness:  [name] is a 48 y.o. very pleasant female patient who presents with the following:  Here today with concern of elevated BP/ not feeling well   She called in today as follows: Pt called with complaints of" not feeling great for the past several weeks"; she was seen at [facility] 08/19/2018 and her BP was high: her most recent readings taken at home with cuff on left upper arm: 08/25/2018 @ 0415 139/99 08/24/2018 @ 1830 162/114                  @ 0600 146/104 the pt says that she has had left chest tightness radiating to her left armpit; the pt also says that she can feel her heart pounding; recommendations made per nurse triage protocol; pt offered and accepted appointment with Dr Lamar Blinks, [place], 08/25/2018 at 1000; she verbalized understanding; will route to office for notifcation.   She went to [facility] last week to follow-up the "spot" recently seen on her colon- she had a large polyp removed in July, path showed adenoma with high grade dysplasia.  [facility] is going to do a follow-up colonoscopy in the next few weeks to make sure all is clear  Her BP was noted to be high at [facility]- 158/92 She was told to start checking her BP at home and she has been doing so- her BP is running high as noted above  She notes that she has "not felt great" in several weeks, she is not sure why  Fatigue, headaches, lightheaded, she feels like her heart is pounding. Periodic left sided chest "tightness" with occasional sharper pains for a week or so.  Will come and go; it is perhaps present just slightly now   She is not sure  if this is worse with activity - she is trying to walk 30 minutes for exercise and feels like this may feel harder recently  She is feeling possibly SOB  She has never done a stress test Her dad had his first heart cath in his 32s and then had CABG.   History of hypothyroidism, removal of massive benign ovarian tumor/ hysterectomy in 2012  BP Readings from Last 3 Encounters:  08/25/18 (!) 146/92  05/10/18 (!) 157/75  04/14/18 (!) 146/83    Never a smoker, married to [place]ana   Lab Results  Component Value Date   TSH 4.34 06/07/2017    Patient Active Problem List   Diagnosis Date Noted  . Personal history of colonic polyps   . H/O colonoscopy with polypectomy   . Acute lower GI bleeding 02/16/2018  . Acute blood loss anemia 02/16/2018  . Acute GI bleeding 02/16/2018  . Abnormal CT of the abdomen 01/12/2018  . Colon cancer screening 01/12/2018  . Obesity, unspecified 07/07/2013  . Hypothyroidism 09/27/2011    Past Medical History:  Diagnosis Date  . Anxiety   . Benign ovarian tumor July 2012   17 pound benign tumor- removed  . Depression   . Personal history of colonic polyps   . Thyroid disease  Past Surgical History:  Procedure Laterality Date  . ABDOMINAL HYSTERECTOMY  2012   one ovary remains- due to giant benign ovarian cyst (17 lbs)  . COLONOSCOPY WITH PROPOFOL N/A 02/16/2018   Procedure: COLONOSCOPY WITH PROPOFOL;  Surgeon: Jerene Bears, MD;  Location: [facility];  Service: Gastroenterology;  Laterality: N/A;  . OVARIAN CYST REMOVAL      Social History   Tobacco Use  . Smoking status: Never Smoker  . Smokeless tobacco: Never Used  Substance Use Topics  . Alcohol use: No    Alcohol/week: 0.0 standard drinks    Comment: socially  . Drug use: No    Family History  Problem Relation Age of Onset  . Heart disease Father   . Stroke Father   . Heart attack Father   . Hyperlipidemia Father   . Hypertension Father   . Colon polyps Father         benign  . Heart disease Paternal Grandfather   . Heart attack Paternal Grandfather   . COPD Mother   . Congestive Heart Failure Mother   . Lung cancer Mother        highly suspected, waiting on definitive bx 03/14/18  . Gallbladder disease Maternal Grandmother   . Stroke Paternal Grandmother   . Colon cancer Neg Hx   . Stomach cancer Neg Hx     Allergies  Allergen Reactions  . Levothyroxine Sodium Anxiety and Palpitations  . Levothyroxine Palpitations    Medication list has been reviewed and updated.  Current Outpatient Medications on File Prior to Visit  Medication Sig Dispense Refill  . cholecalciferol (VITAMIN D) 1000 units tablet Take 2,000 Units by mouth daily.    . Melatonin Gummies 2.5 MG CHEW Chew 1 tablet by mouth daily.     . Multiple Vitamins-Minerals (MULTIVITAMIN WITH MINERALS) tablet Take 1 tablet by mouth daily. Reported on 07/24/2015    . thyroid (ARMOUR THYROID) 30 MG tablet TAKE 2 AND 1/2 TABLETS BY MOUTH ONCE DAILY BEFORE BREAKFAST 225 tablet 0   No current facility-administered medications on file prior to visit.     Review of Systems:  As per HPI- otherwise negative. No fever or chills   Physical Examination: Vitals:   08/25/18 1009  BP: (!) 146/92  Pulse: 88  Resp: 18  Temp: 98.3 F (36.8 C)  SpO2: 99%   Vitals:   08/25/18 1009  Weight: 279 lb (126.6 kg)  Height: 5\' 4"  (1.626 m)   Body mass index is 47.89 kg/m. Ideal Body Weight: Weight in (lb) to have BMI = 25: 145.3  GEN: WDWN, NAD, Non-toxic, A & O x 3, obese, looks well  HEENT: Atraumatic, Normocephalic. Neck supple. No masses, No LAD. Ears and Nose: No external deformity. CV: RRR, No M/G/R. No JVD. No thrill. No extra heart sounds. PULM: CTA B, no wheezes, crackles, rhonchi. No retractions. No resp. distress. No accessory muscle use. I am able to reproduce tenderness over the left inferior rib border  ABD: S, NT, ND, +BS. No rebound. No HSM. EXTR: No c/c/e NEURO Normal gait.   PSYCH: Normally interactive. Conversant. Not depressed or anxious appearing.  Calm demeanor.   EKG: SR, slight T wave change from EKG done over the summer 01/2018 Assessment and Plan: Chest tightness - Plan: EKG 12-Lead  Acquired hypothyroidism - Plan: TSH  Elevated BP without diagnosis of hypertension - Plan: lisinopril (PRINIVIL,ZESTRIL) 10 MG tablet  Here today with concern of elevated BP- she is not on any BP  medication Will start her on lisinopril 10 mg- she is s/o hyst However she also has concerning chest tightness.  EKG today does not show acute MI Referral to ER now for a chest pain eval Had not realized that her last TSH was so long ago.  I ordered this for her- she will ask if ER staff can draw with her other labs  Assuming she is released from ER will plan for cardiology referral/ stress eval Pt is comfortable walking to ER, declines staff escort   Signed Lamar Blinks, MD

## 2018-08-25 NOTE — Patient Instructions (Addendum)
Please proceed to the ER on the ground floor for chest pain evaluation  I ordered a TSH for you; please ask if this can be drawn along with your labs   Assuming you are sent home from the ER we will plan to have you see cardiology for a stress test and further evaluation of your heart   I have given you an rx for lisinopril 10 mg for your BP to start again assuming you are sent home

## 2018-08-25 NOTE — ED Provider Notes (Signed)
[facility] EMERGENCY DEPARTMENT Provider Note   CSN: [id number] Arrival date & time: 08/25/18  1054     History   Chief Complaint Chief Complaint  Patient presents with  . Chest Pain    HPI [name] is a 48 y.o. female.  Patient is a 48 year old female with a significant past medical history of hypothyroidism, and surgery in July and September for hysterectomy and who oophorectomy as well as colon surgery for high-grade dysplasia who is presenting today from her doctor's office for hypertension and generally not feeling well for the last 2 weeks.  Patient states approximately 4 weeks ago she had a very harsh hacking cough that has persisted but is slowly improving.  However for the last 2 weeks she is just not felt well.  She normally walks 30 minutes a day and has noticed she feels more winded after walking and more fatigued.  She also notes that she feels slightly short of breath with lying down and feels like her heart is beating hard.  She denies any palpitations.  However also over the last few weeks she has had intermittent pressure under her left breast that does not radiate does not seem to be related to exertion but she states she has not really paid attention.  She also will intermittently have some tingling in her feet and fingers and occasionally will feel lightheaded.  For the last 2 days she has had a headache which seems to be the same all day long but denies any nausea vomiting or fever.  She has had intermittent diarrhea but nothing consistent.  Eating does not make her symptoms worse.  She denies any unilateral leg pain or swelling.  She has gained 6 pounds in the last 2 months but attributes that to the holidays and early menopause from her surgery.  She does have a significant family medical history of her dad having blockages found when he was 44 years old and then going on to get stents and a CABG.  Also her paternal uncles with similar heart disease.  She  went to follow-up at [facility] to be seen by GI and when she was there last week was found to be hypertensive without a prior history of high blood pressure.  The history is provided by the patient.  Chest Pain  Pain location:  L chest Pain quality: pressure   Pain radiates to:  Does not radiate Pain severity:  Moderate Onset quality:  Gradual Duration:  2 weeks Timing:  Sporadic Progression:  Waxing and waning Chronicity:  New Context comment:  Cannot associate the chest discomfort with anything but with walking her normal 61min she feels more tired and winded Relieved by:  None tried Worsened by:  Nothing Ineffective treatments:  None tried Associated symptoms: cough, fatigue and shortness of breath   Associated symptoms: no abdominal pain, no anorexia, no back pain, no diaphoresis, no fever, no nausea, no palpitations and no vomiting   Associated symptoms comment:  Intermittent diarrhea Risk factors comment:  Thyroid disease   Past Medical History:  Diagnosis Date  . Anxiety   . Benign ovarian tumor July 2012   17 pound benign tumor- removed  . Depression   . Personal history of colonic polyps   . Thyroid disease     Patient Active Problem List   Diagnosis Date Noted  . Personal history of colonic polyps   . H/O colonoscopy with polypectomy   . Acute lower GI bleeding 02/16/2018  . Acute  blood loss anemia 02/16/2018  . Acute GI bleeding 02/16/2018  . Abnormal CT of the abdomen 01/12/2018  . Colon cancer screening 01/12/2018  . Obesity, unspecified 07/07/2013  . Hypothyroidism 09/27/2011    Past Surgical History:  Procedure Laterality Date  . ABDOMINAL HYSTERECTOMY  2012   one ovary remains- due to giant benign ovarian cyst (17 lbs)  . COLONOSCOPY WITH PROPOFOL N/A 02/16/2018   Procedure: COLONOSCOPY WITH PROPOFOL;  Surgeon: Jerene Bears, MD;  Location: [facility];  Service: Gastroenterology;  Laterality: N/A;  . OVARIAN CYST REMOVAL       OB History   No  obstetric history on file.      Home Medications    Prior to Admission medications   Medication Sig Start Date End Date Taking? Authorizing Provider  cholecalciferol (VITAMIN D) 1000 units tablet Take 2,000 Units by mouth daily.   Yes [provider]  Melatonin Gummies 2.5 MG CHEW Chew 1 tablet by mouth daily.    Yes [provider]  Multiple Vitamins-Minerals (MULTIVITAMIN WITH MINERALS) tablet Take 1 tablet by mouth daily. Reported on 07/24/2015   Yes [provider]  thyroid (ARMOUR THYROID) 30 MG tablet TAKE 2 AND 1/2 TABLETS BY MOUTH ONCE DAILY BEFORE BREAKFAST 06/17/18  Yes Copland, Gay Filler, MD  lisinopril (PRINIVIL,ZESTRIL) 10 MG tablet Take 1 tablet (10 mg total) by mouth daily. 08/25/18   Copland, Gay Filler, MD    Family History Family History  Problem Relation Age of Onset  . Heart disease Father   . Stroke Father   . Heart attack Father   . Hyperlipidemia Father   . Hypertension Father   . Colon polyps Father        benign  . Heart disease Paternal Grandfather   . Heart attack Paternal Grandfather   . COPD Mother   . Congestive Heart Failure Mother   . Lung cancer Mother        highly suspected, waiting on definitive bx 03/14/18  . Gallbladder disease Maternal Grandmother   . Stroke Paternal Grandmother   . Colon cancer Neg Hx   . Stomach cancer Neg Hx     Social History Social History   Tobacco Use  . Smoking status: Never Smoker  . Smokeless tobacco: Never Used  Substance Use Topics  . Alcohol use: No    Alcohol/week: 0.0 standard drinks    Comment: socially  . Drug use: No     Allergies   Levothyroxine sodium and Levothyroxine   Review of Systems Review of Systems  Constitutional: Positive for fatigue. Negative for diaphoresis and fever.  Respiratory: Positive for cough and shortness of breath.   Cardiovascular: Positive for chest pain. Negative for palpitations.  Gastrointestinal: Negative for abdominal pain,  anorexia, nausea and vomiting.  Musculoskeletal: Negative for back pain.  All other systems reviewed and are negative.    Physical Exam Updated Vital Signs BP (!) 154/82 (BP Location: Left Arm)   Pulse 82   Temp 97.7 F (36.5 C) (Oral)   Resp 18   Ht 5\' 4"  (1.626 m)   Wt 126.6 kg   SpO2 98%   BMI 47.89 kg/m   Physical Exam Vitals signs and nursing note reviewed.  Constitutional:      General: She is not in acute distress.    Appearance: She is well-developed. She is obese.  HENT:     Head: Normocephalic and atraumatic.  Eyes:     Pupils: Pupils are equal, round, and reactive  to light.  Cardiovascular:     Rate and Rhythm: Normal rate and regular rhythm.     Pulses: Normal pulses.     Heart sounds: Normal heart sounds. No murmur. No friction rub.  Pulmonary:     Effort: Pulmonary effort is normal.     Breath sounds: Examination of the right-lower field reveals decreased breath sounds. Examination of the left-lower field reveals decreased breath sounds. Decreased breath sounds present. No wheezing or rales.  Abdominal:     General: Bowel sounds are normal. There is no distension.     Palpations: Abdomen is soft.     Tenderness: There is no abdominal tenderness. There is no guarding or rebound.  Musculoskeletal: Normal range of motion.        General: No swelling or tenderness.     Right lower leg: No edema.     Left lower leg: No edema.     Comments: No edema  Skin:    General: Skin is warm and dry.     Capillary Refill: Capillary refill takes less than 2 seconds.     Findings: No rash.  Neurological:     Mental Status: She is alert and oriented to person, place, and time. Mental status is at baseline.     Cranial Nerves: No cranial nerve deficit.  Psychiatric:        Mood and Affect: Mood normal.        Behavior: Behavior normal.      ED Treatments / Results  Labs (all labs ordered are listed, but only abnormal results are displayed) Labs Reviewed    COMPREHENSIVE METABOLIC PANEL - Abnormal; Notable for the following components:      Result Value   Potassium 3.4 (*)    All other components within normal limits  CBC WITH DIFFERENTIAL/PLATELET  TROPONIN I  TSH    EKG EKG Interpretation  Date/Time:  Thursday August 25 2018 11:13:25 EST Ventricular Rate:  77 PR Interval:    QRS Duration: 93 QT Interval:  405 QTC Calculation: 459 R Axis:   13 Text Interpretation:  Sinus rhythm LVH by voltage No significant change since last tracing Confirmed by Blanchie Dessert [phone]) on 08/25/2018 11:24:59 AM   Radiology Dg Chest 2 View  Result Date: 08/25/2018 CLINICAL DATA:  48 year old female with 2 weeks of chest tightness and shortness of breath. EXAM: CHEST - 2 VIEW COMPARISON:  Chest radiograph 02/10/2007. FINDINGS: Lung volumes and mediastinal contours are stable and within normal limits. Visualized tracheal air column is within normal limits. No pneumothorax, pulmonary edema, pleural effusion or confluent pulmonary opacity. No acute osseous abnormality identified. Negative visible bowel gas pattern. IMPRESSION: Negative. Electronically Signed   By: Genevie Ann M.D.   On: 08/25/2018 11:40    Procedures Procedures (including critical care time)  Medications Ordered in ED Medications - No data to display   Initial Impression / Assessment and Plan / ED Course  I have reviewed the triage vital signs and the nursing notes.  Pertinent labs & imaging results that were available during my care of the patient were reviewed by me and considered in my medical decision making (see chart for details).    Patient presenting today from her doctor's office upstairs with 2 weeks of general malaise, atypical chest pain and shortness of breath with exertion.  She denies any infectious symptoms other than intermittent diarrhea.  She has also been noted to have elevated blood pressure which is new for her.  She is having  a nonspecific pressure in her left  chest but does not seem to be worsened with exertion or eating.  She does not appear fluid overloaded today.  She is hypertensive at 154/82 but normal temperature, O2 sat and pulse rate.  Patient's EKG today is unchanged from her EKG done in July 2019.  Heart score of 2 to 3 (if you count HTN as a risk factor but no prior hx).  Will rule out ACS with a troponin as patient symptoms have been present for 2 weeks.  Also concern for possible hypokalemia or thyroid dysfunction.  TSH, CMP sent.  Also will ensure no signs of anemia.  12:42 PM cxr wnl.  All labs wnl except for mild hypokalemia of 3.4.  Discussed results with pt and her PCP Dr. Lorelei Pont.  She has already prescribed her lisinopril and will get her set up for a stress test as an outpt.  Pt also encouraged to increase potassium in her diet.  Also given strict return precautions if sx worse.  Final Clinical Impressions(s) / ED Diagnoses   Final diagnoses:  Nonspecific chest pain  Hypertension, unspecified type    ED Discharge Orders    None       Blanchie Dessert, MD 08/25/18 1248

## 2018-08-25 NOTE — ED Notes (Signed)
Pt on monitor 

## 2018-08-25 NOTE — ED Triage Notes (Signed)
Pt has had chest pressure X 6 days, toe and fingers tingling as well

## 2018-08-29 ENCOUNTER — Encounter (HOSPITAL_COMMUNITY): Payer: Self-pay | Admitting: Radiology

## 2018-08-29 ENCOUNTER — Telehealth (HOSPITAL_COMMUNITY): Payer: Self-pay | Admitting: *Deleted

## 2018-08-29 ENCOUNTER — Ambulatory Visit (HOSPITAL_BASED_OUTPATIENT_CLINIC_OR_DEPARTMENT_OTHER): Payer: BLUE CROSS/BLUE SHIELD

## 2018-08-29 ENCOUNTER — Other Ambulatory Visit: Payer: Self-pay | Admitting: Family Medicine

## 2018-08-29 ENCOUNTER — Other Ambulatory Visit (HOSPITAL_COMMUNITY): Payer: BLUE CROSS/BLUE SHIELD

## 2018-08-29 ENCOUNTER — Other Ambulatory Visit: Payer: Self-pay

## 2018-08-29 ENCOUNTER — Ambulatory Visit (HOSPITAL_COMMUNITY): Payer: BLUE CROSS/BLUE SHIELD | Attending: Cardiovascular Disease

## 2018-08-29 DIAGNOSIS — R079 Chest pain, unspecified: Secondary | ICD-10-CM

## 2018-08-29 MED ORDER — PERFLUTREN LIPID MICROSPHERE: 1.0000 mL | INTRAVENOUS | Status: AC | PRN

## 2018-08-29 NOTE — Progress Notes (Signed)
Patient ID: [name], female   DOB: 03-Oct-1970, 48 y.o.   MRN: [id number] Image quality nondiagnostic for stress echocardiogram even using Definity (endocardial enhancement agent). Spoke with reading cardiologist Dr. Margaretann Loveless. Per cardiologist the order was changed to a resting echocardiogram with suggestion of alternative imaging if clinically indicated.

## 2018-08-29 NOTE — Telephone Encounter (Signed)
Patient given detailed instructions per Stress Test Requisition Sheet for test on 08/29/18 at 3:00.Patient Notified to arrive 30 minutes early, and that it is imperative to arrive on time for appointment to keep from having the test rescheduled.  Patient verbalized understanding. [name]

## 2018-08-30 ENCOUNTER — Other Ambulatory Visit: Payer: Self-pay | Admitting: Family Medicine

## 2018-08-30 ENCOUNTER — Encounter: Payer: Self-pay | Admitting: Family Medicine

## 2018-08-30 DIAGNOSIS — R079 Chest pain, unspecified: Secondary | ICD-10-CM

## 2018-09-01 ENCOUNTER — Ambulatory Visit (INDEPENDENT_AMBULATORY_CARE_PROVIDER_SITE_OTHER): Payer: BLUE CROSS/BLUE SHIELD | Admitting: Internal Medicine

## 2018-09-01 ENCOUNTER — Encounter: Payer: Self-pay | Admitting: Internal Medicine

## 2018-09-01 VITALS — BP 152/84 | HR 90 | Ht 64.0 in | Wt 276.8 lb

## 2018-09-01 DIAGNOSIS — I10 Essential (primary) hypertension: Secondary | ICD-10-CM

## 2018-09-01 DIAGNOSIS — R0789 Other chest pain: Secondary | ICD-10-CM

## 2018-09-01 DIAGNOSIS — E782 Mixed hyperlipidemia: Secondary | ICD-10-CM

## 2018-09-01 MED ORDER — METOPROLOL TARTRATE 100 MG PO TABS: ORAL_TABLET | ORAL | 0 refills | Status: DC

## 2018-09-01 NOTE — Progress Notes (Signed)
Cardiology Office Note:    Date:  09/02/2018   ID:  [name], DOB 03-Mar-1971, MRN [id number]  PCP:  Darreld Mclean, MD  Cardiologist:  No primary care provider on file.  Electrophysiologist:  None   Referring MD: Darreld Mclean, MD   Atypical chest pain  History of Present Illness:    [name] is a 48 y.o. female with a hx of benign ovarian tumor status post hysterectomy and oophorectomy, colon polyp status post excision, and weight gain who presents today for atypical chest pain.  Since July 2019 she has had 2 gynecologic operations as well as a hospitalization for colon polyp intervention.  She has had an ICU stay as well.  Over the past several weeks she has had a significant cough which is finally starting to improve slightly she had aggressive coughing with her upper respiratory illness.  Over the past several weeks she has noticed a sensation of discomfort on the left side of her chest.  It will come and go at rest as well as with activity it worsens when she raises her left arm.  In addition she has tender to palpation epigastric discomfort.  She denies significant exertional chest pain.  She does endorse some fatigue and shortness of breath with activity, and notes weight gain over the past several weeks to months due to less activity from overall illness and recovery. Denies worsening of chest pain with deep inspiration.  She has a family history of coronary artery disease and is concerned about her risk with chest pain of CAD.  The patient denies dyspnea at rest, palpitations, PND, orthopnea, or leg swelling. Denies syncope or presyncope. Denies dizziness or lightheadedness.  Denies fever, chills.  Endorses resolving cough.  No hematemesis, no hemoptysis.  Denies nausea, vomiting, diarrhea, constipation, urinary frequency or urgency, hematuria, hematochezia, melena.  Denies unintended weight loss.  She was scheduled to have a stress echocardiogram however she had  nondiagnostic stress images, and routine echocardiogram was performed instead. This showed a preserved LV function and no regional wall motion abnormalities.    Past Medical History:  Diagnosis Date  . Anxiety   . Benign ovarian tumor July 2012   17 pound benign tumor- removed  . Depression   . Personal history of colonic polyps   . Thyroid disease     Past Surgical History:  Procedure Laterality Date  . ABDOMINAL HYSTERECTOMY  2012   one ovary remains- due to giant benign ovarian cyst (17 lbs)  . COLONOSCOPY WITH PROPOFOL N/A 02/16/2018   Procedure: COLONOSCOPY WITH PROPOFOL;  Surgeon: Jerene Bears, MD;  Location: [facility];  Service: Gastroenterology;  Laterality: N/A;  . OVARIAN CYST REMOVAL      Current Medications: Current Meds  Medication Sig  . CALCIUM PO Take 1 capsule by mouth daily.  . cholecalciferol (VITAMIN D) 1000 units tablet Take 2,000 Units by mouth daily.  Marland Kitchen lisinopril (PRINIVIL,ZESTRIL) 10 MG tablet Take 1 tablet (10 mg total) by mouth daily.  . Melatonin Gummies 2.5 MG CHEW Chew 1 tablet by mouth daily.   . Multiple Vitamins-Minerals (MULTIVITAMIN WITH MINERALS) tablet Take 1 tablet by mouth daily. Reported on 07/24/2015  . thyroid (ARMOUR THYROID) 30 MG tablet TAKE 2 AND 1/2 TABLETS BY MOUTH ONCE DAILY BEFORE BREAKFAST  . vitamin B-12 (CYANOCOBALAMIN) 100 MCG tablet Take 100 mcg by mouth daily.     Allergies:   Levothyroxine sodium and Levothyroxine   Social History   Socioeconomic History  . Marital  status: Soil scientist    Spouse name: Not on file  . Number of children: Not on file  . Years of education: Not on file  . Highest education level: Not on file  Occupational History  . Occupation: Education officer, community  Social Needs  . Financial resource strain: Not on file  . Food insecurity:    Worry: Not on file    Inability: Not on file  . Transportation needs:    Medical: Not on file    Non-medical: Not on file  Tobacco Use  .  Smoking status: Never Smoker  . Smokeless tobacco: Never Used  Substance and Sexual Activity  . Alcohol use: No    Alcohol/week: 0.0 standard drinks    Comment: socially  . Drug use: No  . Sexual activity: Yes  Lifestyle  . Physical activity:    Days per week: Not on file    Minutes per session: Not on file  . Stress: Not on file  Relationships  . Social connections:    Talks on phone: Not on file    Gets together: Not on file    Attends religious service: Not on file    Active member of club or organization: Not on file    Attends meetings of clubs or organizations: Not on file    Relationship status: Not on file  Other Topics Concern  . Not on file  Social History Narrative   Significant other   Education: College   Exercise: Yes     Family History: The patient's family history includes COPD in her mother; Colon polyps in her father; Congestive Heart Failure in her mother; Gallbladder disease in her maternal grandmother; Heart attack in her father and paternal grandfather; Heart disease in her father and paternal grandfather; Hyperlipidemia in her father; Hypertension in her father; Lung cancer in her mother; Stroke in her father and paternal grandmother. There is no history of Colon cancer or Stomach cancer.  ROS:   Please see the history of present illness.    All other systems reviewed and are negative.  EKGs/Labs/Other Studies Reviewed:    The following studies were reviewed today:  EKG: Not performed today  Recent Labs: 08/25/2018: ALT 32; BUN 13; Creatinine, Ser 0.56; Hemoglobin 13.3; Platelets 318; Potassium 3.4; Sodium 135; TSH 4.382  Recent Lipid Panel    Component Value Date/Time   CHOL 177 06/07/2017 1339   TRIG 127.0 06/07/2017 1339   HDL 38.60 (L) 06/07/2017 1339   CHOLHDL 5 06/07/2017 1339   VLDL 25.4 06/07/2017 1339   LDLCALC 113 (H) 06/07/2017 1339   LDLDIRECT 124 (H) 05/24/2013 1006    Physical Exam:    VS:  BP (!) 152/84   Pulse 90   Ht  5\' 4"  (1.626 m)   Wt 276 lb 12.8 oz (125.6 kg)   SpO2 95%   BMI 47.51 kg/m     Wt Readings from Last 3 Encounters:  09/01/18 276 lb 12.8 oz (125.6 kg)  08/25/18 279 lb (126.6 kg)  08/25/18 279 lb (126.6 kg)    Constitutional: No acute distress Eyes: pupils equally round and reactive to light, sclera non-icteric, normal conjunctiva and lids ENMT: normal dentition, moist mucous membranes Cardiovascular: regular rhythm, normal rate, no murmurs. S1 and S2 normal. Radial pulses normal bilaterally. No jugular venous distention.  Respiratory: clear to auscultation bilaterally GI : normal bowel sounds, soft and nontender. No distention.   MSK: extremities warm, well perfused. No edema.  NEURO: grossly nonfocal exam,  moves all extremities. PSYCH: alert and oriented x 3, normal mood and affect.   ASSESSMENT:    1. Atypical chest pain   2. Essential hypertension   3. Mixed hyperlipidemia    PLAN:    Atypical chest pain -she has atypical chest pain in the setting of cardiovascular risk factors and a family history of coronary artery disease.  We were unable to complete a stress echocardiogram due to poor image quality, therefore a CT coronary angiogram is indicated.  We will be able to obtain a coronary calcium score as well as luminal plaque assessment.  Given that the patient is also quite young we will also be able to evaluate for any possible coronary anomalies or malignant course that may be contributing to chest pain.  We have discussed the possibility of nuclear stress testing as well, however I think this will be subject to similar image quality issues as stress echo.  We have participated in shared decision making regarding stress testing and have agreed upon CT coronary angiogram.  Hypertension- her blood pressure is mildly elevated today.  She takes lisinopril 10 mg daily which was just recently started by her primary care physician.  I believe that she should continue on this dose for  the current time being, and if her blood pressure is still elevated at our next appointment or at her next visit with her primary care physician the dose should be uptitrated.  I have asked her to contact her primary care physician if her blood pressure is consistently over 140/90 so that she can have more rapid up titration of medical therapy for best cardiovascular health.  Hyperlipidemia- she has elevated lipids and is not currently on lipid-lowering therapy.  She would like to pursue diet and lifestyle modification given her recent multiple medical issues and deconditioning, if at next evaluation her lipids are still elevated or if her coronary CTA are abnormal, we should consider medical therapy with statin.  Medication Adjustments/Labs and Tests Ordered: Current medicines are reviewed at length with the patient today.  Concerns regarding medicines are outlined above.  Orders Placed This Encounter  Procedures  . CT CORONARY MORPH W/CTA COR W/SCORE W/CA W/CM &/OR WO/CM  . CT CORONARY FRACTIONAL FLOW RESERVE DATA PREP  . CT CORONARY FRACTIONAL FLOW RESERVE FLUID ANALYSIS  . Basic metabolic panel   Meds ordered this encounter  Medications  . metoprolol tartrate (LOPRESSOR) 100 MG tablet    Sig: Take 1 tablet (100mg ) 2 hours prior to your Cardiac CT    Dispense:  1 tablet    Refill:  0    Patient Instructions  Medication Instructions:  Your physician recommends that you continue on your current medications as directed. Please refer to the Current Medication list given to you today.  A one time dosage of Metoprolol 100mg  has been sent to your pharmacy to be taken 2 hours prior to your Coronary CT  If you need a refill on your cardiac medications before your next appointment, please call your pharmacy.   Lab work: Your physician recommends that you return for lab work 1-2 days prior to your CT. You do not need to fast. Please bring the lab slip that you were given at your office with you  that day to the lab.  If you have labs (blood work) drawn today and your tests are completely normal, you will receive your results only by: Marland Kitchen MyChart Message (if you have MyChart) OR . A paper copy in the mail  If you have any lab test that is abnormal or we need to change your treatment, we will call you to review the results.  Testing/Procedures: Your physician has requested that you have cardiac CT. Cardiac computed tomography (CT) is a painless test that uses an x-ray machine to take clear, detailed pictures of your heart. For further information please visit HugeFiesta.[email or web address]. Please follow instruction sheet as given.     Follow-Up: At [facility], you and your health needs are our priority.  As part of our continuing mission to provide you with exceptional heart care, we have created designated Provider Care Teams.  These Care Teams include your primary Cardiologist (physician) and Advanced Practice Providers (APPs -  Physician Assistants and Nurse Practitioners) who all work together to provide you with the care you need, when you need it. You will need a follow up appointment 6 weeks after the Cardiac CT   Any Other Special Instructions Will Be Listed Below (If Applicable). Please arrive at the [facility] main entrance of [facility] at TBD AM (30-45 minutes prior to test start time)  The test does to several weeks to be scheduled. A scheduler will call you directly to schedule.  [facility] [address] [place], [place] [address] [phone]  Proceed to the [facility] Radiology Department (First Floor).  Please follow these instructions carefully (unless otherwise directed):    On the Night Before the Test: . Be sure to Drink plenty of water. . Do not consume any caffeinated/decaffeinated beverages or chocolate 12 hours prior to your test. . Do not take any antihistamines 12 hours prior to your test. . If you take Metformin do not take 24  hours prior to test.  On the Day of the Test: . Drink plenty of water. Do not drink any water within one hour of the test. . Do not eat any food 4 hours prior to the test. . You may take your regular medications prior to the test.  . Take metoprolol (Lopressor) two hours prior to test.   After the Test: . Drink plenty of water. . After receiving IV contrast, you may experience a mild flushed feeling. This is normal. . On occasion, you may experience a mild rash up to 24 hours after the test. This is not dangerous. If this occurs, you can take Benadryl 25 mg and increase your fluid intake. . If you experience trouble breathing, this can be serious. If it is severe call 911 IMMEDIATELY. If it is mild, please call our office. . If you take any of these medications: Glipizide/Metformin, Avandament, Glucavance, please do not take 48 hours after completing test.       Signed, Elouise Munroe, MD  09/02/2018 6:28 PM    Center

## 2018-09-01 NOTE — Patient Instructions (Addendum)
Medication Instructions:  Your physician recommends that you continue on your current medications as directed. Please refer to the Current Medication list given to you today.  A one time dosage of Metoprolol 100mg  has been sent to your pharmacy to be taken 2 hours prior to your Coronary CT  If you need a refill on your cardiac medications before your next appointment, please call your pharmacy.   Lab work: Your physician recommends that you return for lab work 1-2 days prior to your CT. You do not need to fast. Please bring the lab slip that you were given at your office with you that day to the lab.  If you have labs (blood work) drawn today and your tests are completely normal, you will receive your results only by: Marland Kitchen MyChart Message (if you have MyChart) OR . A paper copy in the mail If you have any lab test that is abnormal or we need to change your treatment, we will call you to review the results.  Testing/Procedures: Your physician has requested that you have cardiac CT. Cardiac computed tomography (CT) is a painless test that uses an x-ray machine to take clear, detailed pictures of your heart. For further information please visit HugeFiesta.[email or web address]. Please follow instruction sheet as given.     Follow-Up: At [facility], you and your health needs are our priority.  As part of our continuing mission to provide you with exceptional heart care, we have created designated Provider Care Teams.  These Care Teams include your primary Cardiologist (physician) and Advanced Practice Providers (APPs -  Physician Assistants and Nurse Practitioners) who all work together to provide you with the care you need, when you need it. You will need a follow up appointment 6 weeks after the Cardiac CT   Any Other Special Instructions Will Be Listed Below (If Applicable). Please arrive at the [facility] main entrance of [facility] at TBD AM (30-45 minutes prior to test start time)  The  test does to several weeks to be scheduled. A scheduler will call you directly to schedule.  [facility] [address] [place], [place] [address] [phone]  Proceed to the [facility] Radiology Department (First Floor).  Please follow these instructions carefully (unless otherwise directed):    On the Night Before the Test: . Be sure to Drink plenty of water. . Do not consume any caffeinated/decaffeinated beverages or chocolate 12 hours prior to your test. . Do not take any antihistamines 12 hours prior to your test. . If you take Metformin do not take 24 hours prior to test.  On the Day of the Test: . Drink plenty of water. Do not drink any water within one hour of the test. . Do not eat any food 4 hours prior to the test. . You may take your regular medications prior to the test.  . Take metoprolol (Lopressor) two hours prior to test.   After the Test: . Drink plenty of water. . After receiving IV contrast, you may experience a mild flushed feeling. This is normal. . On occasion, you may experience a mild rash up to 24 hours after the test. This is not dangerous. If this occurs, you can take Benadryl 25 mg and increase your fluid intake. . If you experience trouble breathing, this can be serious. If it is severe call 911 IMMEDIATELY. If it is mild, please call our office. . If you take any of these medications: Glipizide/Metformin, Avandament, Glucavance, please do not  take 48 hours after completing test.

## 2018-09-16 ENCOUNTER — Encounter: Payer: BLUE CROSS/BLUE SHIELD | Attending: Family Medicine | Admitting: Registered"

## 2018-09-16 ENCOUNTER — Encounter: Payer: Self-pay | Admitting: Registered"

## 2018-09-16 ENCOUNTER — Other Ambulatory Visit: Payer: Self-pay | Admitting: Family Medicine

## 2018-09-16 DIAGNOSIS — E039 Hypothyroidism, unspecified: Secondary | ICD-10-CM

## 2018-09-16 DIAGNOSIS — R7303 Prediabetes: Secondary | ICD-10-CM

## 2018-09-16 NOTE — Progress Notes (Signed)
Medical Nutrition Therapy:  Appt start time: 8:08 end time:  9:19.   Assessment:  Primary concerns today: Pt arrives stating her weight has yo-yo'd since age 48. Pt states cycle has continued and now she is "large and uncomfortable".   Pt expectations: none stated  Pt states she does not want to get diabetes or develop heart disease. Pt states she has prediabetes (A1c 5.9) and recent concerns with high blood pressure (152/84). Pt reports heart disease being hereditary in paternal family. Pt has an 55 year old son. Pt reports recent surgeries  summer 2019, has gained 12 lbs since July 2019; currently in surgically-induced menopause.   Pt states she eats relatively healthy foods and has large appetite to feel full. 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 Pt states she eats mostly chocolate when stressed. Pt states she will exercises a lot and will lose 17-20 lbs, weight plateaus, and then she regains what she lost plus an extra 20 lbs. Pt states she gets scared every time she starts a program to lose weight because she has gained an extra 20 lbs every time.  Pt states dinner is typically take out because she's tired and does not feel like cooking. Pt uses phrases such as "I get in trouble sometimes", "a big part of my problem", "I will get weak when something stressful happens", and "buy healthy foods" which are a part of diet culture language. Pt states she grew up up in a household where everyone eats "seconds" and others get offended if you didn't eat at least 2 plates of what is being served.   Pt reports sleeping well at night; averages 7-9 hrs/night. Pt states she plans to restart going back to [facility] during lunch break for physical activity.    Preferred Learning Style:   No preference indicated   Learning Readiness:   Ready  Change in progress   MEDICATIONS: See list   DIETARY INTAKE:  Usual eating pattern  includes 3 meals and 2-3 snacks per day.  Everyday foods include eggs, toast, salad, chicken, soup, pasta, fast food, and fruit.  Avoided foods include none stated.    24-hr recall:  B ( AM): 2 eggs + toast  Snk (10:30 AM): banana  L ( PM): salad + chicken breast + vinaigrette or soup Snk ( PM): unsalted peanuts or chips D (6:30-7 PM): Cookout-burger + fries + onion rings or salad or chicken breast + frozen vegetables or spaghetti ([place] meat + whole wheat pasta) Snk ( PM): sometimes fruit Beverages: water, unsweetened tea, sparkling water, coffee (with splenda + cream)   Usual physical activity: walking 30 min, 4 days/week  Estimated energy needs: 1800-2000 calories 200-225 g carbohydrates 135-150 g protein 50-56 g fat  Progress Towards Goal(s):  In progress.   Nutritional Diagnosis:  NB-1.1 Food and nutrition-related knowledge deficit As related to prediabetes.  As evidenced by A1c of 5.9.    Intervention:  Nutrition education and counseling. Pt was educated and counseled on prediabetes, diet culture, the importance of increasing fiber intake, and the importance of increasing physical activity. Pt was in agreement with goals listed.  Goals: - Aim to increase fiber intake with fruit, vegetables and whole grains.  - Aim to have non-starchy vegetables with lunch and dinner. Use handout as guide.  - Aim to make 1/2 plate non-starchy vegetables.  - Continue to aim to increase physical activity to at least 30 minutes/day, 5 days/week.  -  Listen to body for satisfaction.  Teaching Method Utilized:  Visual Auditory Hands on  Handouts given during visit include:  My Plate for prediabetes  Barriers to learning/adherence to lifestyle change: work-life balance, childhood habits related to food  Demonstrated degree of understanding via:  Teach Back   Monitoring/Evaluation:  Dietary intake, exercise, and body weight prn.

## 2018-09-16 NOTE — Patient Instructions (Addendum)
-   Aim to increase fiber intake with fruit, vegetables and whole grains.   - Aim to have non-starchy vegetables with lunch and dinner. Use handout as guide.   - Aim to make 1/2 plate non-starchy vegetables.   - Continue to aim to increase physical activity to at least 30 minutes/day, 5 days/week.   - Listen to body for satisfaction.

## 2018-09-20 ENCOUNTER — Telehealth: Payer: Self-pay | Admitting: Internal Medicine

## 2018-09-20 NOTE — Telephone Encounter (Signed)
Left message for Patient will defer to Dr Margaretann Loveless

## 2018-09-20 NOTE — Telephone Encounter (Signed)
New Message   Patient is calling because she is due to have a CT 2/24 and then to have colonoscopy on Wednesday the 2/26. She was advised to make sure that it will okay to have her colonoscopy even if the CT results are not back. Please call to discuss.

## 2018-09-22 NOTE — Telephone Encounter (Signed)
Spoke with pt and made her aware of Dr.Acharya's response. Pt sts that she would like a call the sameday as her CT with her results. Adv pt that I will fwd her rqst to Dr.Acharya.

## 2018-09-22 NOTE — Telephone Encounter (Signed)
Patient is calling back and needs to know answer

## 2018-09-22 NOTE — Telephone Encounter (Signed)
CT results will be back by 2/25 and we can call the patient same day.   She is at a low cardiovascular risk for moderate sedation with anesthesia. If the anesthesiologist would like to wait until CT results are back, that is ok with me, but I do not see a strong contraindication to proceeding with colonoscopy.

## 2018-09-23 ENCOUNTER — Telehealth (HOSPITAL_COMMUNITY): Payer: Self-pay | Admitting: Emergency Medicine

## 2018-09-23 DIAGNOSIS — R0789 Other chest pain: Secondary | ICD-10-CM | POA: Diagnosis not present

## 2018-09-23 NOTE — Telephone Encounter (Signed)
Reaching out to patient to offer assistance regarding upcoming cardiac imaging study; pt verbalizes understanding of appt date/time, parking situation and where to check in, pre-test NPO status and medications ordered, and verified current allergies; name and call back number provided for further questions should they arise Kadar Chance RN Navigator Cardiac Imaging [facility] [phone] office [phone] cell 

## 2018-09-23 NOTE — Telephone Encounter (Signed)
CT results will likely be available the day after her scan as they are often read after hours by our doctors, as previously mentioned, and we will call her as soon as we have results.  Best,  Dr. Loni Muse

## 2018-09-24 LAB — BASIC METABOLIC PANEL
BUN/Creatinine Ratio: 21 (ref 9–23)
BUN: 15 mg/dL (ref 6–24)
CO2: 24 mmol/L (ref 20–29)
Calcium: 9.5 mg/dL (ref 8.7–10.2)
Chloride: 101 mmol/L (ref 96–106)
Creatinine, Ser: 0.72 mg/dL (ref 0.57–1.00)
GFR calc Af Amer: 115 mL/min/{1.73_m2} (ref >59)
GFR calc non Af Amer: 99 mL/min/{1.73_m2} (ref >59)
Glucose: 107 mg/dL — ABNORMAL HIGH (ref 65–99)
Potassium: 4.3 mmol/L (ref 3.5–5.2)
Sodium: 142 mmol/L (ref 134–144)

## 2018-09-26 ENCOUNTER — Ambulatory Visit (HOSPITAL_COMMUNITY)
Admission: RE | Admit: 2018-09-26 | Discharge: 2018-09-26 | Disposition: A | Discharge status: Home / Self Care | Payer: BLUE CROSS/BLUE SHIELD | Source: Ambulatory Visit | Attending: Internal Medicine | Admitting: Internal Medicine

## 2018-09-26 ENCOUNTER — Encounter (HOSPITAL_COMMUNITY): Payer: Self-pay

## 2018-09-26 DIAGNOSIS — R0789 Other chest pain: Secondary | ICD-10-CM | POA: Diagnosis not present

## 2018-09-26 MED ORDER — IOPAMIDOL (ISOVUE-370) INJECTION 76%
80.0000 mL | Freq: Once | INTRAVENOUS | Status: AC | PRN
  Administered 2018-09-26: 80 mL via INTRAVENOUS

## 2018-09-26 MED ORDER — METOPROLOL TARTRATE 5 MG/5ML IV SOLN
5.0000 mg | INTRAVENOUS | Status: DC | PRN
  Filled 2018-09-26: qty 5

## 2018-09-26 MED ORDER — NITROGLYCERIN 0.4 MG SL SUBL
0.8000 mg | SUBLINGUAL_TABLET | SUBLINGUAL | Status: DC | PRN
  Filled 2018-09-26: qty 25

## 2018-09-26 MED ORDER — NITROGLYCERIN 0.4 MG SL SUBL
SUBLINGUAL_TABLET | SUBLINGUAL | Status: AC
  Administered 2018-09-26: 0.8 mg
  Filled 2018-09-26: qty 2

## 2018-09-28 DIAGNOSIS — G4730 Sleep apnea, unspecified: Secondary | ICD-10-CM | POA: Diagnosis not present

## 2018-09-28 DIAGNOSIS — I10 Essential (primary) hypertension: Secondary | ICD-10-CM | POA: Diagnosis not present

## 2018-09-28 DIAGNOSIS — D120 Benign neoplasm of cecum: Secondary | ICD-10-CM | POA: Diagnosis not present

## 2018-09-28 DIAGNOSIS — Z1211 Encounter for screening for malignant neoplasm of colon: Secondary | ICD-10-CM | POA: Diagnosis not present

## 2018-09-28 DIAGNOSIS — E039 Hypothyroidism, unspecified: Secondary | ICD-10-CM | POA: Diagnosis not present

## 2018-09-28 DIAGNOSIS — Z86010 Personal history of colonic polyps: Secondary | ICD-10-CM | POA: Diagnosis not present

## 2018-09-28 DIAGNOSIS — Z79899 Other long term (current) drug therapy: Secondary | ICD-10-CM | POA: Diagnosis not present

## 2018-09-28 DIAGNOSIS — Z9889 Other specified postprocedural states: Secondary | ICD-10-CM | POA: Diagnosis not present

## 2018-09-28 DIAGNOSIS — Z9989 Dependence on other enabling machines and devices: Secondary | ICD-10-CM | POA: Diagnosis not present

## 2018-09-28 DIAGNOSIS — E669 Obesity, unspecified: Secondary | ICD-10-CM | POA: Diagnosis not present

## 2018-10-06 DIAGNOSIS — D1801 Hemangioma of skin and subcutaneous tissue: Secondary | ICD-10-CM | POA: Diagnosis not present

## 2018-10-06 DIAGNOSIS — D485 Neoplasm of uncertain behavior of skin: Secondary | ICD-10-CM | POA: Diagnosis not present

## 2018-10-06 DIAGNOSIS — L821 Other seborrheic keratosis: Secondary | ICD-10-CM | POA: Diagnosis not present

## 2018-10-06 DIAGNOSIS — D229 Melanocytic nevi, unspecified: Secondary | ICD-10-CM | POA: Diagnosis not present

## 2018-10-06 DIAGNOSIS — L814 Other melanin hyperpigmentation: Secondary | ICD-10-CM | POA: Diagnosis not present

## 2018-10-14 ENCOUNTER — Telehealth: Payer: Self-pay | Admitting: *Deleted

## 2018-10-14 NOTE — Telephone Encounter (Signed)
Received Dermatopathology Report results from [facility]; forwarded to provider/SLS 03/13

## 2018-12-01 DIAGNOSIS — G4733 Obstructive sleep apnea (adult) (pediatric): Secondary | ICD-10-CM | POA: Diagnosis not present

## 2018-12-01 DIAGNOSIS — R05 Cough: Secondary | ICD-10-CM | POA: Diagnosis not present

## 2018-12-01 DIAGNOSIS — Z9989 Dependence on other enabling machines and devices: Secondary | ICD-10-CM | POA: Diagnosis not present

## 2018-12-28 ENCOUNTER — Telehealth: Payer: Self-pay

## 2018-12-28 NOTE — Telephone Encounter (Signed)
   TELEPHONE CALL NOTE  This patient has been deemed a candidate for follow-up tele-health visit to limit community exposure during the Covid-19 pandemic. I spoke with the patient via phone to discuss instructions.   A Virtual Office Visit appointment type has been scheduled for [email or web address] with acharya,   I have either confirmed the patient is active in MyChart or offered to send sign-up link to phone/email via Mychart icon beside patient's photo.  Waylan Rocher, LPN 6/68/1594 7:07 PM

## 2019-01-04 ENCOUNTER — Telehealth: Payer: Self-pay | Admitting: Internal Medicine

## 2019-01-04 NOTE — Telephone Encounter (Signed)
smartphone/ consent/ my chart active/ pre reg completed °

## 2019-01-09 ENCOUNTER — Telehealth (INDEPENDENT_AMBULATORY_CARE_PROVIDER_SITE_OTHER): Payer: BLUE CROSS/BLUE SHIELD | Admitting: Internal Medicine

## 2019-01-09 ENCOUNTER — Encounter: Payer: Self-pay | Admitting: Internal Medicine

## 2019-01-09 VITALS — BP 115/83 | HR 74 | Ht 64.0 in | Wt 283.2 lb

## 2019-01-09 DIAGNOSIS — E782 Mixed hyperlipidemia: Secondary | ICD-10-CM | POA: Diagnosis not present

## 2019-01-09 DIAGNOSIS — R0789 Other chest pain: Secondary | ICD-10-CM | POA: Diagnosis not present

## 2019-01-09 DIAGNOSIS — R05 Cough: Secondary | ICD-10-CM

## 2019-01-09 DIAGNOSIS — I10 Essential (primary) hypertension: Secondary | ICD-10-CM | POA: Diagnosis not present

## 2019-01-09 DIAGNOSIS — E039 Hypothyroidism, unspecified: Secondary | ICD-10-CM

## 2019-01-09 DIAGNOSIS — R059 Cough, unspecified: Secondary | ICD-10-CM

## 2019-01-09 DIAGNOSIS — Z6841 Body Mass Index (BMI) 40.0 and over, adult: Secondary | ICD-10-CM

## 2019-01-09 MED ORDER — LOSARTAN POTASSIUM 25 MG PO TABS: 25.0000 mg | ORAL_TABLET | Freq: Every day | ORAL | 3 refills | Status: DC

## 2019-01-09 NOTE — Patient Instructions (Addendum)
Medication Instructions:  Stop Lisinopril due to cough. Start Losartan 25 mg daily. (call back in 1 month, with any symptoms and blood pressure log)  If you need a refill on your cardiac medications before your next appointment, please call your pharmacy.   Lab work: Lipid in 6 months If you have labs (blood work) drawn today and your tests are completely normal, you will receive your results only by: Marland Kitchen MyChart Message (if you have MyChart) OR . A paper copy in the mail If you have any lab test that is abnormal or we need to change your treatment, we will call you to review the results.  Follow-Up: At [facility], you and your health needs are our priority.  As part of our continuing mission to provide you with exceptional heart care, we have created designated Provider Care Teams.  These Care Teams include your primary Cardiologist (physician) and Advanced Practice Providers (APPs -  Physician Assistants and Nurse Practitioners) who all work together to provide you with the care you need, when you need it. You will need a follow up appointment in 6 months.  Please call our office 2 months in advance to schedule this appointment.  You may see Elouise Munroe, MD or one of the following Advanced Practice Providers on your designated Care Team:   Rosaria Ferries, PA-C . Jory Sims, DNP, ANP

## 2019-01-09 NOTE — Progress Notes (Signed)
Virtual Visit via Video Note   This visit type was conducted due to national recommendations for restrictions regarding the COVID-19 Pandemic (e.g. social distancing) in an effort to limit this patient's exposure and mitigate transmission in our community.  Due to her co-morbid illnesses, this patient is at least at moderate risk for complications without adequate follow up.  This format is felt to be most appropriate for this patient at this time.  All issues noted in this document were discussed and addressed.  A limited physical exam was performed with this format.  Please refer to the patient's chart for her consent to telehealth for [facility].   Date:  01/09/2019   ID:  [name], DOB July 09, 1971, MRN [id number]  Patient Location: Home Provider Location: Home  PCP:  Copland, Gay Filler, MD  Cardiologist:  Elouise Munroe, MD  Electrophysiologist:  None   Evaluation Performed:  Follow-Up Visit  Chief Complaint:  F/u hypertension, atypical chest pain and HLD  History of Present Illness:    [name] is a 48 y.o. female with a hx of benign ovarian tumor status post hysterectomy and oophorectomy, colon polyp status post excision, and weight gain who presents today for follow up of chest pain, HTN, and HLD.  She reports that she had had a viral illness in December prior to our initial encounter with a cough.  That cough had improved, but she is persisted with a dry throat which she feels temporally correlates with the initiation of lisinopril 10 mg daily for blood pressure.  She coughs mainly at night.  Her ENT was concerned that this may be related to lisinopril given that there were no other concerns of postnasal drip or GERD as a contributor to her cough.  She denies any infectious symptoms.  She often feels her dry throat is so significant that she has to have a lozenge or some water.  Her blood pressures have been well controlled on lisinopril ranging from the 458K to  998P systolic and 38S diastolic and she feels better while taking antihypertensive therapy.  With regard to cough and dry throat, she has a CPAP for 13 years, and has never experienced a dry cough or dry throat with this.  She does not feel that there have been any changes to her CPAP therapy recently.  She denies any additional episodes of the atypical chest pain for which we initially evaluated her.  She has struggled during the COVID-19 global pandemic with inability to diet and exercise the way that she would like.  I have commiserated with her on this and stated that this is a wide spread issue that we are all experiencing.  To this end it has been difficult to perform aggressive diet and lifestyle modification for the benefit of her hyperlipidemia.  The patient denies chest pain, chest pressure, dyspnea at rest or with exertion, palpitations, PND, orthopnea, or leg swelling. Denies syncope or presyncope. Denies dizziness or lightheadedness.   The patient does not have symptoms concerning for COVID-19 infection (fever, chills, cough, or new shortness of breath).    Past Medical History:  Diagnosis Date   Anxiety    Benign ovarian tumor July 2012   17 pound benign tumor- removed   Depression    Hypertension    Personal history of colonic polyps    Sleep apnea    Thyroid disease    Past Surgical History:  Procedure Laterality Date   ABDOMINAL HYSTERECTOMY  2012   one ovary  remains- due to giant benign ovarian cyst (17 lbs)   COLONOSCOPY WITH PROPOFOL N/A 02/16/2018   Procedure: COLONOSCOPY WITH PROPOFOL;  Surgeon: Jerene Bears, MD;  Location: [facility];  Service: Gastroenterology;  Laterality: N/A;   OVARIAN CYST REMOVAL       Current Meds  Medication Sig   CALCIUM PO Take 1 capsule by mouth daily.   cholecalciferol (VITAMIN D) 1000 units tablet Take 2,000 Units by mouth daily.   lisinopril (PRINIVIL,ZESTRIL) 10 MG tablet Take 1 tablet (10 mg total) by mouth  daily.   Melatonin Gummies 2.5 MG CHEW Chew 1 tablet by mouth daily.    Multiple Vitamins-Minerals (MULTIVITAMIN WITH MINERALS) tablet Take 1 tablet by mouth daily. Reported on 07/24/2015   thyroid (ARMOUR THYROID) 30 MG tablet TAKE 2 AND 1/2 TABLETS BY MOUTH EVERY DAY BEFORE BREAKFAST   vitamin B-12 (CYANOCOBALAMIN) 100 MCG tablet Take 100 mcg by mouth daily.     Allergies:   Levothyroxine sodium and Levothyroxine   Social History   Tobacco Use   Smoking status: Never Smoker   Smokeless tobacco: Never Used  Substance Use Topics   Alcohol use: No    Alcohol/week: 0.0 standard drinks    Comment: socially   Drug use: No     Family Hx: The patient's family history includes COPD in her mother; Colon polyps in her father; Congestive Heart Failure in her mother; Gallbladder disease in her maternal grandmother; Heart attack in her father and paternal grandfather; Heart disease in her father and paternal grandfather; Hyperlipidemia in her father; Hypertension in her father; Lung cancer in her mother; Stroke in her father and paternal grandmother. There is no history of Colon cancer or Stomach cancer.  ROS:   Please see the history of present illness.     All other systems reviewed and are negative.   Prior CV studies:   The following studies were reviewed today:    Labs/Other Tests and Data Reviewed:    EKG:  No ECG reviewed.  Recent Labs: 08/25/2018: ALT 32; Hemoglobin 13.3; Platelets 318; TSH 4.382 09/23/2018: BUN 15; Creatinine, Ser 0.72; Potassium 4.3; Sodium 142   Recent Lipid Panel Lab Results  Component Value Date/Time   CHOL 177 06/07/2017 01:39 PM   TRIG 127.0 06/07/2017 01:39 PM   HDL 38.60 (L) 06/07/2017 01:39 PM   CHOLHDL 5 06/07/2017 01:39 PM   LDLCALC 113 (H) 06/07/2017 01:39 PM   LDLDIRECT 124 (H) 05/24/2013 10:06 AM    Wt Readings from Last 3 Encounters:  01/09/19 283 lb 3.2 oz (128.5 kg)  09/01/18 276 lb 12.8 oz (125.6 kg)  08/25/18 279 lb  (126.6 kg)     Objective:    Vital Signs:  BP 115/83    Pulse 74    Ht 5\' 4"  (1.626 m)    Wt 283 lb 3.2 oz (128.5 kg)    BMI 48.61 kg/m    VITAL SIGNS:  reviewed GEN:  no acute distress EYES:  sclerae anicteric, EOMI - Extraocular Movements Intact RESPIRATORY:  normal respiratory effort, symmetric expansion CARDIOVASCULAR:  no peripheral edema SKIN:  no rash, lesions or ulcers. MUSCULOSKELETAL:  no obvious deformities. NEURO:  alert and oriented x 3, no obvious focal deficit PSYCH:  normal affect  ASSESSMENT & PLAN:    1. Cough   2. Atypical chest pain   3. Essential hypertension   4. Mixed hyperlipidemia   5. Class 3 severe obesity without serious comorbidity with body mass index (BMI) of 45.0 to  49.9 in adult, unspecified obesity type ([place])   6. Hypothyroidism, unspecified type     For her cough we will transition to losartan 25 mg daily and monitor for persistent symptoms.  We have discussed that amlodipine or spironolactone could be alternate antihypertensive therapy, both of which I think she would tolerate well.  We have discussed the risks, benefits, and alternative of each medication and medication class and have participated in shared decision making regarding the initiation of losartan as our first option.  She is enjoying good blood pressure control on ACE inhibitor therapy.  She will call us in 1 month or sooner and let us know if she is having any adverse effects, and what her blood pressure readings have been.  Atypical chest pain- no recurrent episodes  Hypertension- will transition to losartan, monitor for response  Hyperlipidemia- the patient would like to continue to pursue diet and lifestyle modification given that it has been difficult to do during the COVID-19 pandemic.  I agree that this is a challenging time to attempt.lifestyle modification to the degree that we may need to perform for her lipids to be under better control.  We will double check a lipid panel  in 6 months and follow-up at that time regarding the initiation of statin therapy.  Fortunately she has minimal coronary artery disease by CT angiogram.  COVID-19 Education: The signs and symptoms of COVID-19 were discussed with the patient and how to seek care for testing (follow up with PCP or arrange E-visit).  The importance of social distancing was discussed today.  Time:   Today, I have spent 22 minutes with the patient with telehealth technology discussing the above problems.     Medication Adjustments/Labs and Tests Ordered: Current medicines are reviewed at length with the patient today.  Concerns regarding medicines are outlined above.   Tests Ordered: No orders of the defined types were placed in this encounter.   Medication Changes: No orders of the defined types were placed in this encounter.   Disposition:  Follow up in 6 month(s)  Signed, Elouise Munroe, MD  01/09/2019 8:33 AM     [facility]

## 2019-02-06 ENCOUNTER — Telehealth: Payer: Self-pay | Admitting: Internal Medicine

## 2019-02-06 NOTE — Telephone Encounter (Signed)
Great to hear she is tolerating well.  Let us know if blood pressures are elevated, otherwise continue at current dose.

## 2019-02-06 NOTE — Telephone Encounter (Signed)
° °  1. Name of Medication:  losartan (COZAAR) 25 MG tablet  2. How are you currently taking this medication (dosage and times per day)? 25 mg once daily  3. Are you having a reaction (difficulty breathing--STAT)? no  4. What is your medication issue? None.  Patient states she is tolerating the medicine well. She was told to let Dr. Delphina Cahill nurse know how she was doing with the medicine after a month.

## 2019-02-07 NOTE — Telephone Encounter (Signed)
PATIENT AWARE OF DR Margaretann Loveless COMMENTS AND WILL CONTINUE WITH LOSARTAN , - NO COUGH, MONITOR BLOOD PRESSURE

## 2019-02-18 ENCOUNTER — Other Ambulatory Visit: Payer: Self-pay | Admitting: Internal Medicine

## 2019-02-18 DIAGNOSIS — Z20822 Contact with and (suspected) exposure to covid-19: Secondary | ICD-10-CM

## 2019-02-22 LAB — NOVEL CORONAVIRUS, NAA: SARS-CoV-2, NAA: NOT DETECTED

## 2019-04-17 ENCOUNTER — Encounter: Payer: Self-pay | Admitting: Family Medicine

## 2019-05-24 ENCOUNTER — Ambulatory Visit (INDEPENDENT_AMBULATORY_CARE_PROVIDER_SITE_OTHER): Payer: Managed Care, Other (non HMO) | Admitting: Gastroenterology

## 2019-05-24 ENCOUNTER — Encounter: Payer: Self-pay | Admitting: Gastroenterology

## 2019-05-24 VITALS — BP 128/84 | HR 96 | Temp 98.4°F | Ht 64.0 in | Wt 291.2 lb

## 2019-05-24 DIAGNOSIS — Z86018 Personal history of other benign neoplasm: Secondary | ICD-10-CM

## 2019-05-24 DIAGNOSIS — R1031 Right lower quadrant pain: Secondary | ICD-10-CM

## 2019-05-24 NOTE — Patient Instructions (Addendum)
You have been scheduled for a CT scan of the abdomen and pelvis at [facility] - 1st floor Radiology  You are scheduled on 05/31/19 at 8:30am. You should arrive 15 minutes prior to your appointment time for registration. Please follow the written instructions below on the day of your exam:  WARNING: IF YOU ARE ALLERGIC TO IODINE/X-RAY DYE, PLEASE NOTIFY RADIOLOGY IMMEDIATELY AT [phone]! YOU WILL BE GIVEN A 13 HOUR PREMEDICATION PREP.  1) Do not eat or drink anything after midnight the night before procedure. 2) You have been given 2 bottles of oral contrast to drink. The solution may taste better if refrigerated, but do NOT add ice or any other liquid to this solution. Shake well before drinking.    Drink 1 bottle of contrast @ 6:30am (2 hours prior to your exam)  Drink 1 bottle of contrast @ 7:30am (1 hour prior to your exam)  You may take any medications as prescribed with a small amount of water, if necessary. If you take any of the following medications: METFORMIN, GLUCOPHAGE, GLUCOVANCE, AVANDAMET, RIOMET, FORTAMET, [place] MET, JANUMET, GLUMETZA or METAGLIP, you MAY be asked to HOLD this medication 48 hours AFTER the exam.  The purpose of you drinking the oral contrast is to aid in the visualization of your intestinal tract. The contrast solution may cause some diarrhea. Depending on your individual set of symptoms, you may also receive an intravenous injection of x-ray contrast/dye. Plan on being at [facility] for 30 minutes or longer, depending on the type of exam you are having performed.  This test typically takes 30-45 minutes to complete.  If you have any questions regarding your exam or if you need to reschedule, you may call the CT department at [phone] between the hours of 8:00 am and 5:00 pm, Monday-Friday.  Your provider has requested that you go to the basement level for lab work before leaving today. Press "B" on the elevator. The lab is located at the first  door on the left as you exit the elevator.   Thank you for choosing me and [facility].  Janett Billow Zehr-PA

## 2019-05-24 NOTE — Progress Notes (Signed)
05/24/2019 [name] [id number] 1971-04-15   HISTORY OF PRESENT ILLNESS: This is a pleasant 48 year old female who is being seen here today with complaints of right lower quadrant abdominal pain.  I saw her in the office here last June for an abnormal CT scan showing possible lesion in her cecum.  She underwent colonoscopy with Dr. Silverio Decamp and was found to have a polypoid lesion in the cecum.  She was referred to [facility] and underwent colonoscopy with endoscopic mucosal resection on July 17 with Dr. Stephanie Acre.  This lesion showed focal high-grade dysplasia.  2 days later on July 19 she ended up being hospitalized here through [facility] due to a post polypectomy bleed.  Underwent a repeat colonoscopy by Dr. Hilarie Fredrickson during that hospitalization and was found to have a 5 cm post polypectomy scar noted, but no active bleeding.  She became frustrated with her care at [facility] so then returned for follow-up to see Dr. Mont Dutton at [facility].  She had a colonoscopy there in February 2020 at which time she had a post polypectomy scar found in the cecum with healthy-appearing tissue and no residual polyp.  Plan was for repeat colonoscopy again in a year.  Anyway, she is here now with complaints of right lower quadrant/groin pain for the past 2 months.  She says that there is constantly some discomfort, but becomes quite severe on occasion.  She says that this pain is very similar and reminiscent to pain that she had prior to undergoing her colonoscopy issues with polyp resection as well as her other GYN resections.  At this point she has had a complete and total hysterectomy.  She denies any change in her bowel habits or any sign of rectal bleeding.  She tells me that the pain does not seem to be affected by eating or bowel habits.  She tells me that does wake her up from sleep at night at times.  Of note, she did have a 17 pound ovarian tumor removed several years ago, and then had one ovary left and had a  large tumor removed from that as well as a oophorectomy in September 2019.  Both of these tumors were benign.   Past Medical History:  Diagnosis Date   Anxiety    Benign ovarian tumor July 2012   17 pound benign tumor- removed   Depression    Hypertension    Personal history of colonic polyps    Sleep apnea    Thyroid disease    Past Surgical History:  Procedure Laterality Date   ABDOMINAL HYSTERECTOMY  2012   one ovary remains- due to giant benign ovarian cyst (17 lbs)   COLONOSCOPY WITH PROPOFOL N/A 02/16/2018   Procedure: COLONOSCOPY WITH PROPOFOL;  Surgeon: Jerene Bears, MD;  Location: [facility];  Service: Gastroenterology;  Laterality: N/A;   OVARIAN CYST REMOVAL      reports that she has never smoked. She has never used smokeless tobacco. She reports that she does not drink alcohol or use drugs. family history includes COPD in her mother; Colon polyps in her father; Congestive Heart Failure in her mother; Gallbladder disease in her maternal grandmother; Heart attack in her father and paternal grandfather; Heart disease in her father and paternal grandfather; Hyperlipidemia in her father; Hypertension in her father; Lung cancer in her mother; Stroke in her father and paternal grandmother. Allergies  Allergen Reactions   Levothyroxine Sodium Anxiety and Palpitations   Lisinopril Cough   Levothyroxine Palpitations  Outpatient Encounter Medications as of 05/24/2019  Medication Sig   Melatonin Gummies 2.5 MG CHEW Chew 1 tablet by mouth daily.    Multiple Vitamins-Minerals (MULTIVITAMIN WITH MINERALS) tablet Take 1 tablet by mouth daily. Reported on 07/24/2015   thyroid (ARMOUR THYROID) 30 MG tablet TAKE 2 AND 1/2 TABLETS BY MOUTH EVERY DAY BEFORE BREAKFAST   [DISCONTINUED] metoprolol tartrate (LOPRESSOR) 100 MG tablet Take 1 tablet (100mg ) 2 hours prior to your Cardiac CT   losartan (COZAAR) 25 MG tablet Take 1 tablet (25 mg total) by mouth daily.    [DISCONTINUED] CALCIUM PO Take 1 capsule by mouth daily.   [DISCONTINUED] cholecalciferol (VITAMIN D) 1000 units tablet Take 2,000 Units by mouth daily.   [DISCONTINUED] vitamin B-12 (CYANOCOBALAMIN) 100 MCG tablet Take 100 mcg by mouth daily.   No facility-administered encounter medications on file as of 05/24/2019.      REVIEW OF SYSTEMS  : All other systems reviewed and negative except where noted in the History of Present Illness.   PHYSICAL EXAM: BP 128/84 (BP Location: Right Arm, Patient Position: Sitting, Cuff Size: Normal) Comment (Cuff Size): forearm   Pulse 96    Temp 98.4 F (36.9 C) (Oral)    Ht 5\' 4"  (1.626 m)    Wt 291 lb 4 oz (132.1 kg)    BMI 49.99 kg/m  General: Well developed white female in no acute distress Head: Normocephalic and atraumatic Eyes:  Sclerae anicteric, conjunctiva pink. Ears: Normal auditory acuity Lungs: Clear throughout to auscultation; no increased WOB. Heart: Regular rate and rhythm; no M/R/G. Abdomen: Soft, non-distended.  BS present.  Non-tender. Musculoskeletal: Symmetrical with no gross deformities  Skin: No lesions on visible extremities Extremities: No edema  Neurological: Alert oriented x 4, grossly non-focal Psychological:  Alert and cooperative. Normal mood and affect  ASSESSMENT AND PLAN: *RLQ abdominal pain: Patient has history of 2 large benign ovarian tumors as well as a large cecal polyp requiring endoscopic mucosal resection that showed focal high-grade dysplasia.  Pain in right lower quadrant is similar to pain that she was describing in the past.  She just had colonoscopy at [facility] in February.  I think we will start by repeating CT scan of the abdomen and pelvis with contrast.  If that is unrevealing we could consider repeating colonoscopy.  I wonder if her pain is actually due to scar tissue/adhesions, which very well may be, but we will evaluate and rule out any other issues first.   CC:  Copland, Gay Filler, MD

## 2019-05-25 NOTE — Progress Notes (Signed)
Reviewed and agree with documentation and assessment and plan. K. Veena Ysabela Keisler , MD   

## 2019-05-26 ENCOUNTER — Other Ambulatory Visit: Payer: Managed Care, Other (non HMO)

## 2019-05-26 DIAGNOSIS — Z86018 Personal history of other benign neoplasm: Secondary | ICD-10-CM

## 2019-05-26 DIAGNOSIS — R1031 Right lower quadrant pain: Secondary | ICD-10-CM

## 2019-05-27 LAB — BUN/CREATININE RATIO
BUN: 13 mg/dL (ref 7–25)
Creat: 0.6 mg/dL (ref 0.50–1.10)
GFR, Est African American: 125 mL/min/{1.73_m2} (ref >=60)
GFR, Est Non African American: 108 mL/min/{1.73_m2} (ref >=60)

## 2019-05-30 ENCOUNTER — Telehealth: Payer: Self-pay | Admitting: Gastroenterology

## 2019-05-31 ENCOUNTER — Ambulatory Visit (HOSPITAL_COMMUNITY): Payer: Managed Care, Other (non HMO)

## 2019-06-06 ENCOUNTER — Ambulatory Visit (HOSPITAL_COMMUNITY): Payer: Managed Care, Other (non HMO)

## 2019-06-06 ENCOUNTER — Telehealth: Payer: Self-pay

## 2019-06-06 NOTE — Telephone Encounter (Signed)
Confirmed with patient and faxed to [facility].

## 2019-06-06 NOTE — Telephone Encounter (Signed)
Hello,  Due to financial reasons, I would like my CT scan performed at [facility] ([address], [place], [place] [address]) as opposed to [facility].  My insurance company contacted me and informed there was an $1,100 difference in cost.  Can the order for my CT scan be sent to fax number [phone]?  I appreciate your assistance

## 2019-06-07 NOTE — Telephone Encounter (Signed)
The order was faxed to the [facility] as requested.  I spoke with the pt and she tells me that I do not need to call them to schedule.  She says that she has already spoken to them and has arranged the appt. She will have them fax a copy to our office

## 2019-06-15 IMAGING — US US ART/VEN ABD/PELV/SCROTUM DOPPLER LTD
1 series · 13 of 25 positions shown · non-contrast
Comparison: None.

CLINICAL DATA: Adnexal mass seen on CT imaging. History of right
ovarian mass. Hysterectomy and right oophorectomy.



[Series 1: us art/ven abd/pelv/scrotum doppler ltd · 0.28mm/px · 13 of 27 slices shown]
[im 1/27]
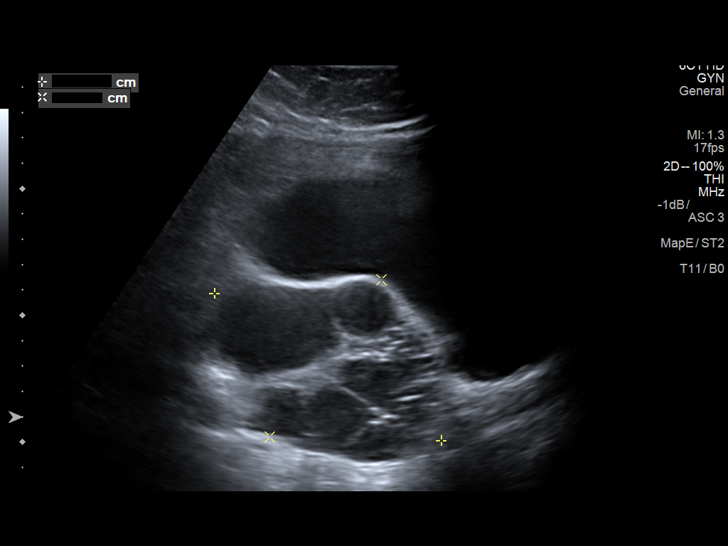
[im 3/27]
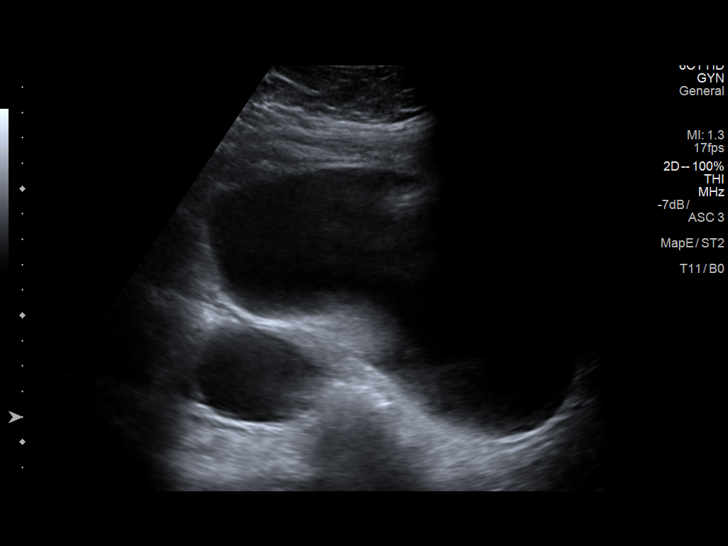
[im 5/27]
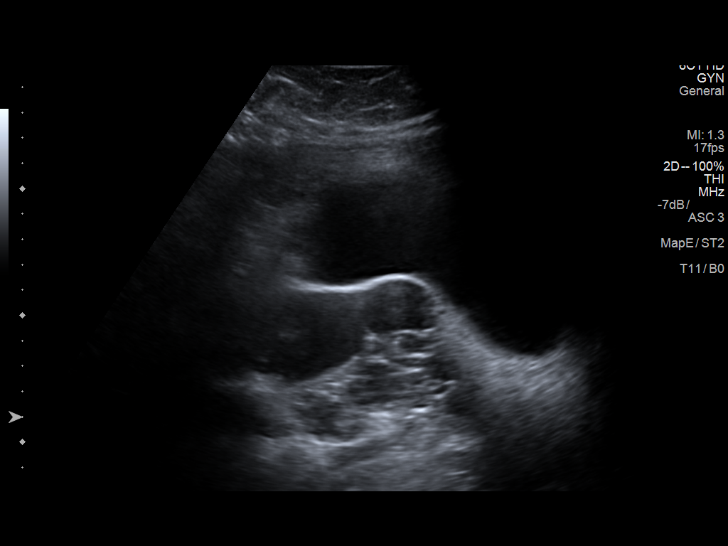
[im 7/27]
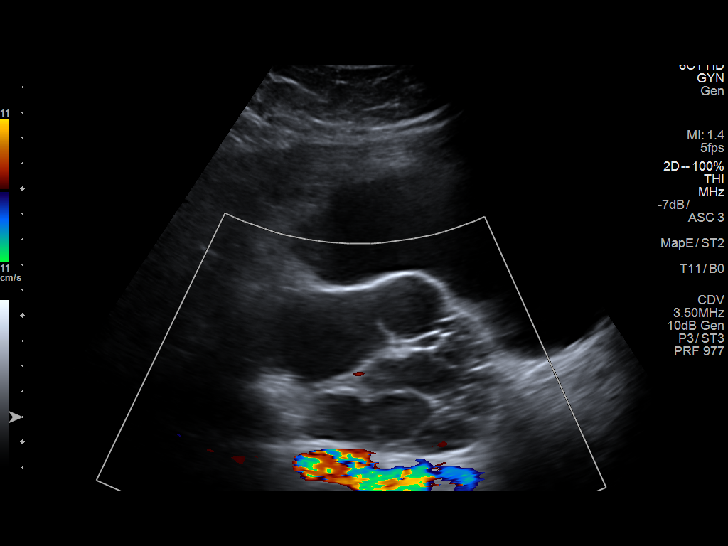
[im 9/27]
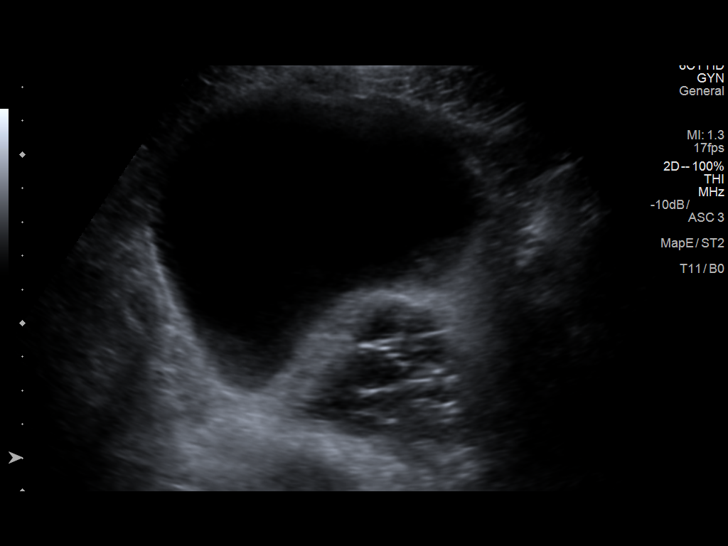
[im 11/27]
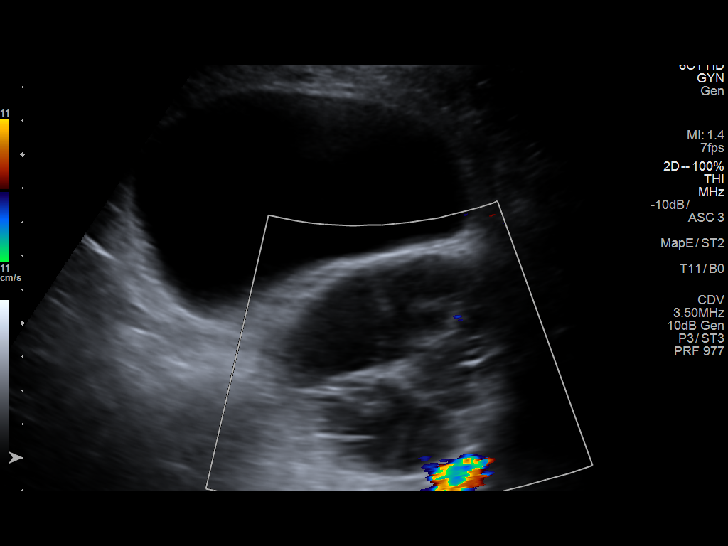
[im 14/27]
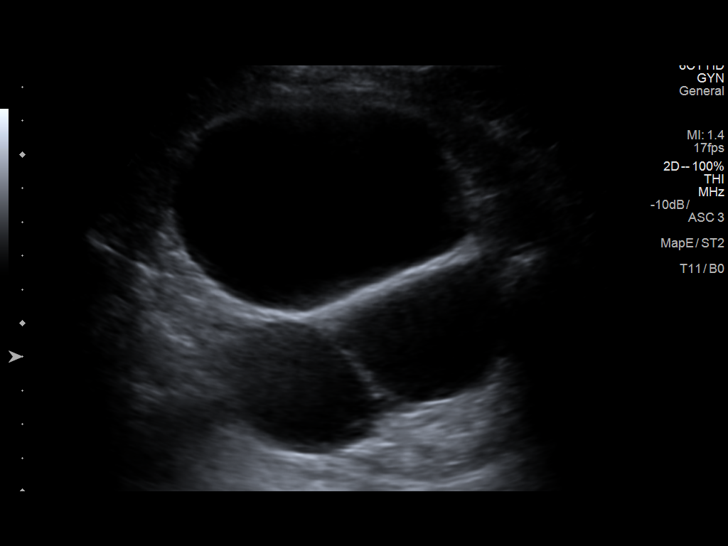
[im 16/27]
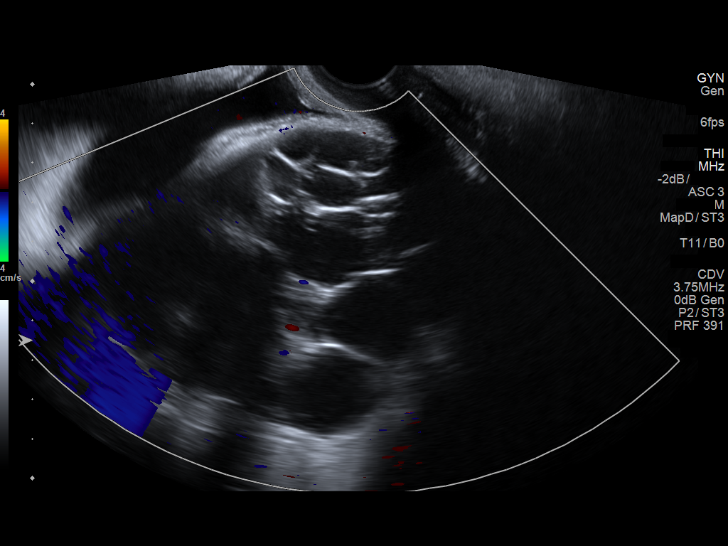
[im 18/27]
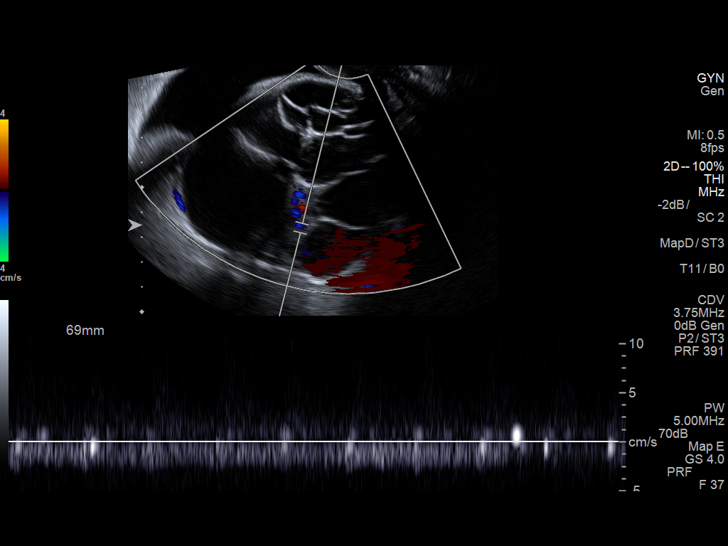
[im 20/27]
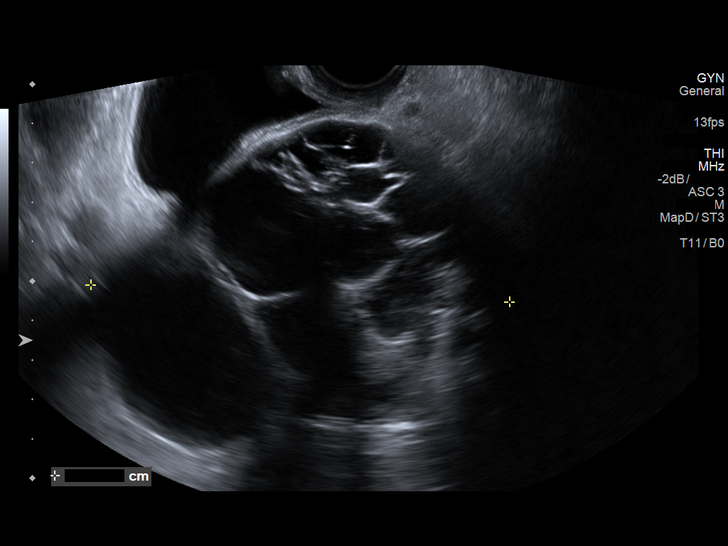
[im 22/27]
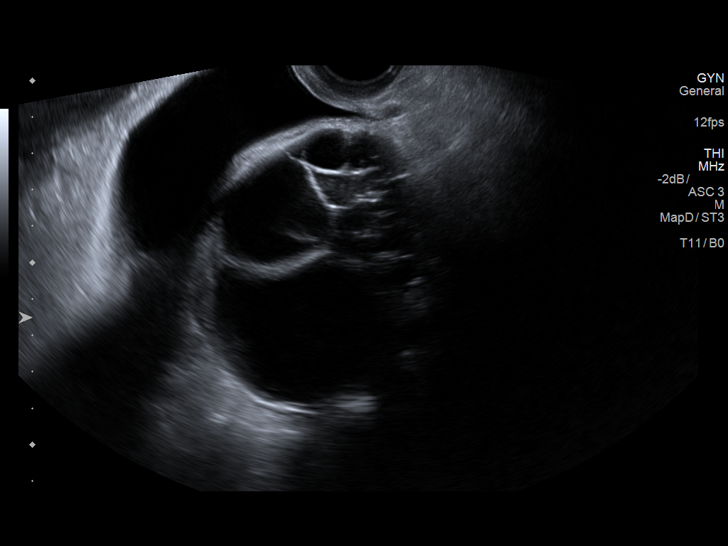
[im 24/27]
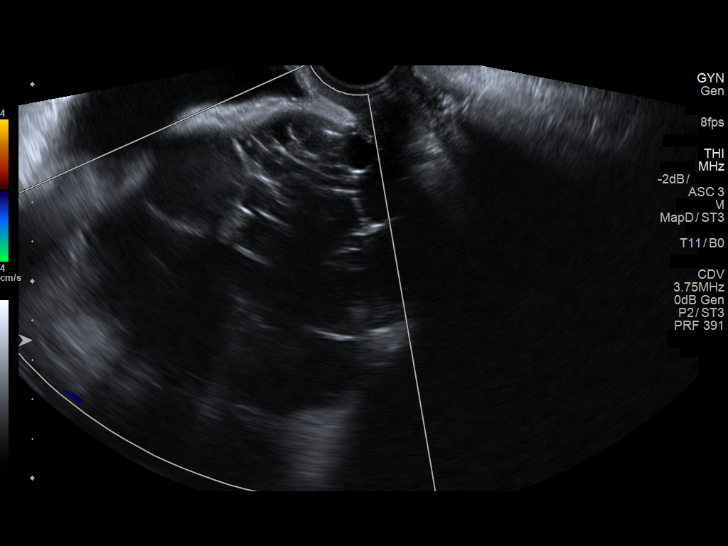
[im 27/27]
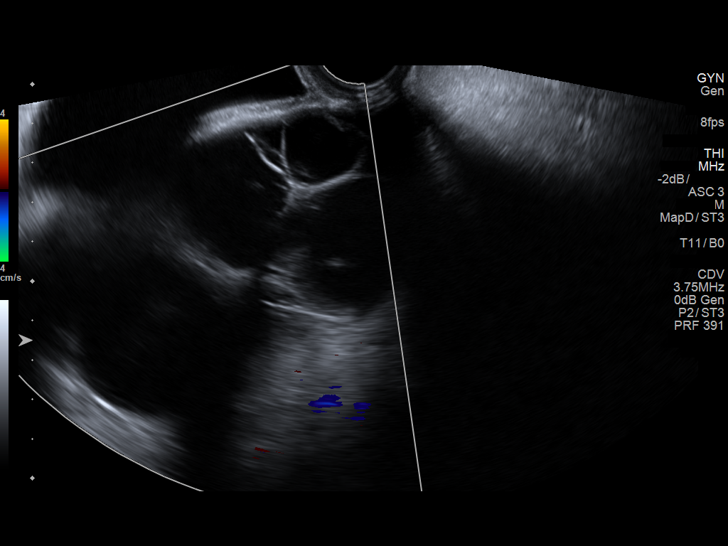

[13 of 25 positions shown; findings below may reference images not displayed]

FINDINGS: Uterus

Surgically absent.

Right ovary

Surgically absent.

Left ovary

There is a complex cystic mass with thickened septa in the left
adnexa, likely arising from the left ovary. The mass measures 10.7 x
7.6 x 7.1 cm. The mass does contain arterial and venous blood flow
within the thickened septa..

Pulsed Doppler evaluation of both ovaries demonstrates low
resistance arterial and venous blood flow within the left adnexal
mass.

Other findings

No abnormal free fluid.
IMPRESSION: 1. There is a complex cystic mass with thickened septa in the left
adnexum. A separate ovary is not seen. The mass likely arises within
the ovary. There is blood flow within the complex cystic mass. The
mass is consistent with a neoplasm. Recommend surgical consultation.
If clinically warranted, an MRI could be utilized for further
assessment before resection.

These results will be called to the ordering clinician or
representative by the Radiologist Assistant, and communication
documented in the PACS or zVision Dashboard.

## 2019-06-20 ENCOUNTER — Ambulatory Visit: Payer: BLUE CROSS/BLUE SHIELD | Admitting: Gastroenterology

## 2019-07-04 ENCOUNTER — Other Ambulatory Visit: Payer: Self-pay | Admitting: Family Medicine

## 2019-07-04 DIAGNOSIS — Z1231 Encounter for screening mammogram for malignant neoplasm of breast: Secondary | ICD-10-CM

## 2019-07-13 ENCOUNTER — Ambulatory Visit: Payer: Managed Care, Other (non HMO) | Admitting: Internal Medicine

## 2019-08-07 ENCOUNTER — Telehealth: Payer: Managed Care, Other (non HMO) | Admitting: Internal Medicine

## 2019-08-08 ENCOUNTER — Encounter: Payer: Self-pay | Admitting: Internal Medicine

## 2019-08-08 ENCOUNTER — Telehealth: Payer: Self-pay | Admitting: *Deleted

## 2019-08-08 ENCOUNTER — Telehealth (INDEPENDENT_AMBULATORY_CARE_PROVIDER_SITE_OTHER): Payer: Managed Care, Other (non HMO) | Admitting: Internal Medicine

## 2019-08-08 VITALS — BP 124/87 | Ht 64.0 in | Wt 299.0 lb

## 2019-08-08 DIAGNOSIS — E782 Mixed hyperlipidemia: Secondary | ICD-10-CM | POA: Diagnosis not present

## 2019-08-08 DIAGNOSIS — R0789 Other chest pain: Secondary | ICD-10-CM | POA: Diagnosis not present

## 2019-08-08 DIAGNOSIS — E039 Hypothyroidism, unspecified: Secondary | ICD-10-CM | POA: Diagnosis not present

## 2019-08-08 DIAGNOSIS — I10 Essential (primary) hypertension: Secondary | ICD-10-CM

## 2019-08-08 NOTE — Progress Notes (Signed)
Virtual Visit via Telephone Note   This visit type was conducted due to national recommendations for restrictions regarding the COVID-19 Pandemic (e.g. social distancing) in an effort to limit this patient's exposure and mitigate transmission in our community.  Due to her co-morbid illnesses, this patient is at least at moderate risk for complications without adequate follow up.  This format is felt to be most appropriate for this patient at this time.  The patient did not have access to video technology/had technical difficulties with video requiring transitioning to audio format only (telephone).  All issues noted in this document were discussed and addressed.  No physical exam could be performed with this format.  Please refer to the patient's chart for her  consent to telehealth for [facility].   Date:  08/08/2019   ID:  [name], DOB [birth year], MRN [id number]  Patient Location: Home Provider Location: Office  PCP:  Darreld Mclean, MD  Cardiologist:  Elouise Munroe, MD  Electrophysiologist:  None   Evaluation Performed:  Follow-Up Visit  Chief Complaint:  F/u chest pain and fhx of CAD  History of Present Illness:    [name] is a 49 y.o. female with benign ovarian tumor status post hysterectomy and oophorectomy, colon polyp status post excision, and weight gain who presents today for follow up of chest pain, HTN, and HLD.  She has had no additional chest pain.   BP is well controlled with readings mostly in the 120s. Occasional [id number] mmHg systolic BP. Tolerating losartan 25 mg daily.  HLD - LDL not at goal at last check in 2018. She is checking lipids with her PCP and will send them over. We discussed mild coronary artery calcifications and family history, and would likely recommend statin therapy.    The patient does not have symptoms concerning for COVID-19 infection (fever, chills, cough, or new shortness of breath).    Past Medical History:  Diagnosis  Date  . Anxiety   . Benign ovarian tumor July 2012   17 pound benign tumor- removed  . Depression   . Hypertension   . Personal history of colonic polyps   . Sleep apnea   . Thyroid disease    Past Surgical History:  Procedure Laterality Date  . ABDOMINAL HYSTERECTOMY  2012   one ovary remains- due to giant benign ovarian cyst (17 lbs)  . COLONOSCOPY WITH PROPOFOL N/A 02/16/2018   Procedure: COLONOSCOPY WITH PROPOFOL;  Surgeon: Jerene Bears, MD;  Location: [facility];  Service: Gastroenterology;  Laterality: N/A;  . OVARIAN CYST REMOVAL       Current Meds  Medication Sig  . losartan (COZAAR) 25 MG tablet Take 1 tablet (25 mg total) by mouth daily.  . Melatonin Gummies 2.5 MG CHEW Chew 1 tablet by mouth daily.   . Multiple Vitamins-Minerals (MULTIVITAMIN WITH MINERALS) tablet Take 1 tablet by mouth daily. Reported on 07/24/2015  . thyroid (ARMOUR THYROID) 30 MG tablet TAKE 2 AND 1/2 TABLETS BY MOUTH EVERY DAY BEFORE BREAKFAST     Allergies:   Levothyroxine sodium, Lisinopril, and Levothyroxine   Social History   Tobacco Use  . Smoking status: Never Smoker  . Smokeless tobacco: Never Used  Substance Use Topics  . Alcohol use: No    Alcohol/week: 0.0 standard drinks    Comment: socially  . Drug use: No     Family Hx: The patient's family history includes COPD in her mother; Colon polyps in her father; Congestive Heart Failure in  her mother; Gallbladder disease in her maternal grandmother; Heart attack in her father and paternal grandfather; Heart disease in her father and paternal grandfather; Hyperlipidemia in her father; Hypertension in her father; Lung cancer in her mother; Stroke in her father and paternal grandmother. There is no history of Colon cancer or Stomach cancer.  ROS:   Please see the history of present illness.     All other systems reviewed and are negative.   Prior CV studies:   The following studies were reviewed today:  CCTA  Labs/Other Tests  and Data Reviewed:    EKG:  No ECG reviewed.  Recent Labs: 08/25/2018: ALT 32; Hemoglobin 13.3; Platelets 318; TSH 4.382 09/23/2018: Potassium 4.3; Sodium 142 05/26/2019: BUN 13; Creat 0.60   Recent Lipid Panel Lab Results  Component Value Date/Time   CHOL 177 06/07/2017 01:39 PM   TRIG 127.0 06/07/2017 01:39 PM   HDL 38.60 (L) 06/07/2017 01:39 PM   CHOLHDL 5 06/07/2017 01:39 PM   LDLCALC 113 (H) 06/07/2017 01:39 PM   LDLDIRECT 124 (H) 05/24/2013 10:06 AM    Wt Readings from Last 3 Encounters:  08/08/19 299 lb (135.6 kg)  05/24/19 291 lb 4 oz (132.1 kg)  01/09/19 283 lb 3.2 oz (128.5 kg)     Objective:    Vital Signs:  BP 124/87   Ht 5\' 4"  (1.626 m)   Wt 299 lb (135.6 kg)   BMI 51.32 kg/m    VITAL SIGNS:  reviewed GEN:  no acute distress NEURO:  alert and oriented x 3 PSYCH:  normal affect  ASSESSMENT & PLAN:    1. Essential hypertension   2. Mixed hyperlipidemia   3. Atypical chest pain   4. Hypothyroidism, unspecified type    HTN - well controlled on losartan 25 mg daily. Continue  HLD - will likely start crestor 5 mg daily however I would like to review her latest lipid panel first. She will be in touch with those results.   Atypical chest pain - resolved. Will continue to monitor. No obstructive CAD on CCTA.   Preventive cardiology - patient is focused on weight loss and dietary modifications. She has had more difficulty with that this year during the [place] 19 pandemic.    COVID-19 Education: The signs and symptoms of COVID-19 were discussed with the patient and how to seek care for testing (follow up with PCP or arrange E-visit).  The importance of social distancing was discussed today.  Time:   Today, I have spent 11 minutes with the patient with telehealth technology discussing the above problems.     Medication Adjustments/Labs and Tests Ordered: Current medicines are reviewed at length with the patient today.  Concerns regarding medicines are  outlined above.   Tests Ordered: No orders of the defined types were placed in this encounter.   Medication Changes: No orders of the defined types were placed in this encounter.   Follow Up:  Either In Person or Virtual in 3 month(s)  Signed, Elouise Munroe, MD  08/08/2019 9:11 AM    [place]

## 2019-08-08 NOTE — Telephone Encounter (Signed)
RN spoke t opatient. Instruction were given  from today's virtual visit 08/08/19 .  AVS SUMMARY has been sent by mychart .   Patient verbalized understanding.

## 2019-08-08 NOTE — Patient Instructions (Addendum)
Medication Instructions:  Depending on your cholesterol results will probably start Rosuvastattin 5 mg daily . Will call in once labs are reviewed  *If you need a refill on your cardiac medications before your next appointment, please call your pharmacy*  Lab Work: Not needed   Testing/Procedures: Not needed  Follow-Up: At [facility], you and your health needs are our priority.  As part of our continuing mission to provide you with exceptional heart care, we have created designated Provider Care Teams.  These Care Teams include your primary Cardiologist (physician) and Advanced Practice Providers (APPs -  Physician Assistants and Nurse Practitioners) who all work together to provide you with the care you need, when you need it.  Your next appointment:   3 month(s)  The format for your next appointment:   Virtual Visit   Provider:   Cherlynn Kaiser, MD

## 2019-08-25 ENCOUNTER — Other Ambulatory Visit: Payer: Self-pay

## 2019-08-25 ENCOUNTER — Ambulatory Visit
Admission: RE | Admit: 2019-08-25 | Discharge: 2019-08-25 | Disposition: A | Discharge status: Home / Self Care | Payer: Managed Care, Other (non HMO) | Source: Ambulatory Visit | Attending: Family Medicine | Admitting: Family Medicine

## 2019-08-25 DIAGNOSIS — Z1231 Encounter for screening mammogram for malignant neoplasm of breast: Secondary | ICD-10-CM

## 2019-08-29 ENCOUNTER — Emergency Department (HOSPITAL_COMMUNITY)
Admission: EM | Admit: 2019-08-29 | Discharge: 2019-08-29 | Disposition: A | Discharge status: Home / Self Care | Payer: Managed Care, Other (non HMO) | Attending: Emergency Medicine | Admitting: Emergency Medicine

## 2019-08-29 ENCOUNTER — Emergency Department (HOSPITAL_COMMUNITY): Payer: Managed Care, Other (non HMO)

## 2019-08-29 ENCOUNTER — Encounter (HOSPITAL_COMMUNITY): Payer: Self-pay | Admitting: *Deleted

## 2019-08-29 ENCOUNTER — Other Ambulatory Visit: Payer: Self-pay

## 2019-08-29 DIAGNOSIS — Z20822 Contact with and (suspected) exposure to covid-19: Secondary | ICD-10-CM | POA: Insufficient documentation

## 2019-08-29 DIAGNOSIS — Z79899 Other long term (current) drug therapy: Secondary | ICD-10-CM | POA: Diagnosis not present

## 2019-08-29 DIAGNOSIS — R4781 Slurred speech: Secondary | ICD-10-CM | POA: Insufficient documentation

## 2019-08-29 DIAGNOSIS — R29810 Facial weakness: Secondary | ICD-10-CM | POA: Diagnosis present

## 2019-08-29 DIAGNOSIS — E039 Hypothyroidism, unspecified: Secondary | ICD-10-CM | POA: Insufficient documentation

## 2019-08-29 DIAGNOSIS — I10 Essential (primary) hypertension: Secondary | ICD-10-CM | POA: Diagnosis not present

## 2019-08-29 DIAGNOSIS — R200 Anesthesia of skin: Secondary | ICD-10-CM | POA: Insufficient documentation

## 2019-08-29 DIAGNOSIS — R202 Paresthesia of skin: Secondary | ICD-10-CM | POA: Insufficient documentation

## 2019-08-29 DIAGNOSIS — K922 Gastrointestinal hemorrhage, unspecified: Secondary | ICD-10-CM | POA: Diagnosis not present

## 2019-08-29 DIAGNOSIS — R519 Headache, unspecified: Secondary | ICD-10-CM | POA: Insufficient documentation

## 2019-08-29 LAB — CBC
HCT: 42.1 % (ref 36.0–46.0)
Hemoglobin: 14.2 g/dL (ref 12.0–15.0)
MCH: 31.1 pg (ref 26.0–34.0)
MCHC: 33.7 g/dL (ref 30.0–36.0)
MCV: 92.3 fL (ref 80.0–100.0)
Platelets: 312 10*3/uL (ref 150–400)
RBC: 4.56 MIL/uL (ref 3.87–5.11)
RDW: 12.6 % (ref 11.5–15.5)
WBC: 9.8 10*3/uL (ref 4.0–10.5)
nRBC: 0 % (ref 0.0–0.2)

## 2019-08-29 LAB — COMPREHENSIVE METABOLIC PANEL
ALT: 20 U/L (ref 0–44)
AST: 32 U/L (ref 15–41)
Albumin: 4.3 g/dL (ref 3.5–5.0)
Alkaline Phosphatase: 77 U/L (ref 38–126)
Anion gap: 9 (ref 5–15)
BUN: 14 mg/dL (ref 6–20)
CO2: 24 mmol/L (ref 22–32)
Calcium: 9.3 mg/dL (ref 8.9–10.3)
Chloride: 104 mmol/L (ref 98–111)
Creatinine, Ser: 0.63 mg/dL (ref 0.44–1.00)
GFR calc Af Amer: >60 mL/min (ref >60)
GFR calc non Af Amer: >60 mL/min (ref >60)
Glucose, Bld: 100 mg/dL — ABNORMAL HIGH (ref 70–99)
Potassium: 4.8 mmol/L (ref 3.5–5.1)
Sodium: 137 mmol/L (ref 135–145)
Total Bilirubin: 1.2 mg/dL (ref 0.3–1.2)
Total Protein: 8.7 g/dL — ABNORMAL HIGH (ref 6.5–8.1)

## 2019-08-29 LAB — DIFFERENTIAL
Abs Immature Granulocytes: 0.05 10*3/uL (ref 0.00–0.07)
Basophils Absolute: 0.1 10*3/uL (ref 0.0–0.1)
Basophils Relative: 1 %
Eosinophils Absolute: 0.1 10*3/uL (ref 0.0–0.5)
Eosinophils Relative: 1 %
Immature Granulocytes: 1 %
Lymphocytes Relative: 27 %
Lymphs Abs: 2.6 10*3/uL (ref 0.7–4.0)
Monocytes Absolute: 0.7 10*3/uL (ref 0.1–1.0)
Monocytes Relative: 7 %
Neutro Abs: 6.2 10*3/uL (ref 1.7–7.7)
Neutrophils Relative %: 63 %

## 2019-08-29 LAB — PROTIME-INR
INR: 0.9 (ref 0.8–1.2)
Prothrombin Time: 12.5 seconds (ref 11.4–15.2)

## 2019-08-29 LAB — SARS CORONAVIRUS 2 (TAT 6-24 HRS): SARS Coronavirus 2: NEGATIVE

## 2019-08-29 LAB — APTT: aPTT: 29 seconds (ref 24–36)

## 2019-08-29 MED ORDER — SODIUM CHLORIDE 0.9 % IV BOLUS
500.0000 mL | Freq: Once | INTRAVENOUS | Status: AC
  Administered 2019-08-29: 500 mL via INTRAVENOUS

## 2019-08-29 MED ORDER — LORAZEPAM 2 MG/ML IJ SOLN
2.0000 mg | Freq: Once | INTRAMUSCULAR | Status: AC
  Administered 2019-08-29: 2 mg via INTRAVENOUS
  Filled 2019-08-29: qty 1

## 2019-08-29 MED ORDER — SODIUM CHLORIDE 0.9 % IV SOLN
100.0000 mL/h | INTRAVENOUS | Status: DC
  Administered 2019-08-29: 100 mL/h via INTRAVENOUS

## 2019-08-29 MED ORDER — PROCHLORPERAZINE EDISYLATE 10 MG/2ML IJ SOLN
10.0000 mg | Freq: Once | INTRAMUSCULAR | Status: AC
  Administered 2019-08-29: 10 mg via INTRAVENOUS
  Filled 2019-08-29: qty 2

## 2019-08-29 NOTE — ED Provider Notes (Signed)
3:54 PM Care assumed from Dr. Jeanell Sparrow.  At time of transfer care, patient is awaiting results of MRI to look for stroke causing the facial droop and finger tingling sensation.  Headache has been improved after Compazine.  If MRI is negative, plan of care is to discharge patient with outpatient neurology follow-up.  If stroke is discovered, will touch base with neurology team for further recommendations.  8:02 PM MRI just returned completely reassuring.  No evidence of stroke, tumor, or MS.  Suspect complicated migraine extremity symptoms.  Headache is resolved and symptoms have also improved.  Speech is clear.   Patient will follow-up with outpatient neurology per previous plan.  She had no questions or concerns and was discharged in good condition with improving symptoms.  Clinical Impression: 1. Facial droop   2. Nonintractable headache, unspecified chronicity pattern, unspecified headache type     Disposition: Discharge  Condition: Good  I have discussed the results, Dx and Tx plan with the pt(& family if present). He/she/they expressed understanding and agree(s) with the plan. Discharge instructions discussed at great length. Strict return precautions discussed and pt &/or family have verbalized understanding of the instructions. No further questions at time of discharge.    New Prescriptions   No medications on file    Follow Up: [place] [address]     [place] [phone] [place] DEPT [place] [id number] [place] [phone]       Jerod Mcquain, Gwenyth Allegra, MD 08/29/19 2004

## 2019-08-29 NOTE — ED Notes (Signed)
MRI reports they are to get patient at 1630 for MRI. RN made patient aware. Patient questioned if she could schedule MRI outpatient due to her feeling improvement.  RN made MD aware

## 2019-08-29 NOTE — ED Provider Notes (Signed)
[facility] DEPT Provider Note   CSN: [id number] Arrival date & time: 08/29/19  1120     History Chief Complaint  Patient presents with  . Facial Droop    [name] is a 49 y.o. female.  HPI 49 yo female hos obesity and hypertension complaining of right facial numbness, paresthesias, and drooping, with slurred speech that began yesterday at midday.  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  It is unclear whether or not it completely resolved in between.  She has some sharp pains to the right neck and side of right head to temple and eye that began while here.  She also has numbness of fourth and fifth right finger, but with some associated weakness.  No difficulty walking, speaking, or swallowing.  Two years ago had some similar symptoms with right side of face and fingers, today it is in the same area but symptoms more severe.  AT prior time, she had normal mri and symptoms resolved.  PMD Silvestre Mesi- Maryanna Shape     Past Medical History:  Diagnosis Date  . Anxiety   . Benign ovarian tumor July 2012   17 pound benign tumor- removed  . Depression   . Hypertension   . Personal history of colonic polyps   . Sleep apnea   . Thyroid disease     Patient Active Problem List   Diagnosis Date Noted  . RLQ abdominal pain 05/24/2019  . History of benign ovarian tumor 05/24/2019  . Personal history of colonic polyps   . H/O colonoscopy with polypectomy   . Acute lower GI bleeding 02/16/2018  . Acute blood loss anemia 02/16/2018  . Acute GI bleeding 02/16/2018  . Abnormal CT of the abdomen 01/12/2018  . Colon cancer screening 01/12/2018  . Obesity, unspecified 07/07/2013  . Hypothyroidism 09/27/2011    Past Surgical History:  Procedure Laterality Date  . ABDOMINAL HYSTERECTOMY  2012   one ovary remains- due to giant benign ovarian cyst (17 lbs)  .  COLONOSCOPY WITH PROPOFOL N/A 02/16/2018   Procedure: COLONOSCOPY WITH PROPOFOL;  Surgeon: Jerene Bears, MD;  Location: [facility];  Service: Gastroenterology;  Laterality: N/A;  . OVARIAN CYST REMOVAL       OB History   No obstetric history on file.     Family History  Problem Relation Age of Onset  . Heart disease Father   . Stroke Father   . Heart attack Father   . Hyperlipidemia Father   . Hypertension Father   . Colon polyps Father        benign  . Heart disease Paternal Grandfather   . Heart attack Paternal Grandfather   . COPD Mother   . Congestive Heart Failure Mother   . Lung cancer Mother        highly suspected, waiting on definitive bx 03/14/18  . Gallbladder disease Maternal Grandmother   . Stroke Paternal Grandmother   . Colon cancer Neg Hx   . Stomach cancer Neg Hx     Social History   Tobacco Use  . Smoking status: Never Smoker  . Smokeless tobacco: Never Used  Substance Use Topics  . Alcohol use: No    Alcohol/week: 0.0 standard drinks    Comment: socially  . Drug use: No    Home Medications Prior to Admission medications   Medication Sig Start Date End Date  Taking? Authorizing Provider  losartan (COZAAR) 25 MG tablet Take 1 tablet (25 mg total) by mouth daily. 01/09/19 08/08/19  Elouise Munroe, MD  Melatonin Gummies 2.5 MG CHEW Chew 1 tablet by mouth daily.     [provider]  Multiple Vitamins-Minerals (MULTIVITAMIN WITH MINERALS) tablet Take 1 tablet by mouth daily. Reported on 07/24/2015    [provider]  thyroid (ARMOUR THYROID) 30 MG tablet TAKE 2 AND 1/2 TABLETS BY MOUTH EVERY DAY BEFORE BREAKFAST 09/19/18   Copland, Gay Filler, MD    Allergies    Levothyroxine sodium, Lisinopril, and Levothyroxine  Review of Systems   Review of Systems  Constitutional: Positive for fatigue.  HENT: Negative.   Eyes: Negative for visual disturbance.  Respiratory: Negative.   Cardiovascular: Negative.   Gastrointestinal: Negative.    Endocrine: Negative.   Genitourinary: Negative.   Musculoskeletal: Negative.   Skin: Negative.   Allergic/Immunologic: Negative.   Neurological: Positive for facial asymmetry.  Hematological: Negative.   Psychiatric/Behavioral: Negative.     Physical Exam Updated Vital Signs BP (!) 167/94 (BP Location: Right Arm)   Pulse 93   Temp 99.2 F (37.3 C)   Resp 17   Ht 1.626 m (5\' 4" )   Wt 131.1 kg   SpO2 98%   BMI 49.61 kg/m   Physical Exam Vitals and nursing note reviewed.  Constitutional:      General: She is in acute distress.     Appearance: Normal appearance. She is obese.  HENT:     Right Ear: External ear normal.     Left Ear: External ear normal.     Nose: Nose normal.  Eyes:     Extraocular Movements: Extraocular movements intact.     Conjunctiva/sclera: Conjunctivae normal.     Pupils: Pupils are equal, round, and reactive to light.  Cardiovascular:     Rate and Rhythm: Normal rate and regular rhythm.     Pulses: Normal pulses.     Heart sounds: Normal heart sounds.  Pulmonary:     Effort: Pulmonary effort is normal.     Breath sounds: Normal breath sounds.  Abdominal:     General: Abdomen is flat.     Palpations: Abdomen is soft.  Musculoskeletal:        General: Normal range of motion.     Cervical back: Rigidity present.  Skin:    General: Skin is warm.  Neurological:     Mental Status: She is alert and oriented to person, place, and time.     Cranial Nerves: Cranial nerve deficit present.     Sensory: No sensory deficit.     Motor: Weakness present.     Coordination: Coordination normal.     Gait: Gait normal.     Deep Tendon Reflexes: Reflexes normal.     Comments: Facial asymmetry with left facial movement lagging although patient feels symptom on right No palmar drift, no leg drift No sensory abnormalities  Psychiatric:        Mood and Affect: Mood normal.        Behavior: Behavior normal.     ED Results / Procedures / Treatments     Labs (all labs ordered are listed, but only abnormal results are displayed) Labs Reviewed  PROTIME-INR  APTT  CBC  DIFFERENTIAL  COMPREHENSIVE METABOLIC PANEL    EKG EKG Interpretation  Date/Time:  Tuesday August 29 2019 12:11:26 EST Ventricular Rate:  80 PR Interval:    QRS Duration: 91 QT Interval:  407 QTC Calculation: 470 R Axis:   0 Text Interpretation: Sinus rhythm Low voltage, precordial leads Confirmed by Pattricia Boss [phone]) on 08/29/2019 1:12:04 PM   Radiology No results found.  Procedures Procedures (including critical care time)  Medications Ordered in ED Medications - No data to display  ED Course  I have reviewed the triage vital signs and the nursing notes.  Pertinent labs & imaging results that were available during my care of the patient were reviewed by me and considered in my medical decision making (see chart for details). Initial work up - labs and ct normal.  Discussed with Dr. Alanson Puls. MRI with and without ordered.  Plan f/u op neurology if normal.  Will discuss again with Dr. Curt Bears if abnormalities noted. Discussed plan with Dr. Sherry Ruffing and patient.  MRI is likely not available until 630.  We discussed plan for outpatient follow-up if normal and admission if abnormalities noted.   MDM Rules/Calculators/A&P                       Final Clinical Impression(s) / ED Diagnoses Final diagnoses:  Facial droop  Nonintractable headache, unspecified chronicity pattern, unspecified headache type    Rx / DC Orders ED Discharge Orders    None       Pattricia Boss, MD 08/29/19 1537

## 2019-08-29 NOTE — ED Triage Notes (Addendum)
Pt started to have rt facial droop and slurred speech yesterday about mid day, feels as if symptoms are worse today, tightness in rt shoulder and tingling in rt fingers

## 2019-08-29 NOTE — Discharge Instructions (Addendum)
Please follow up with your neurologist.  Return if you are worse at any time.

## 2019-10-02 ENCOUNTER — Encounter: Payer: Self-pay | Admitting: Family Medicine

## 2019-10-02 DIAGNOSIS — E039 Hypothyroidism, unspecified: Secondary | ICD-10-CM

## 2019-10-02 MED ORDER — THYROID 30 MG PO TABS: ORAL_TABLET | ORAL | 1 refills | Status: DC

## 2019-10-16 ENCOUNTER — Ambulatory Visit (INDEPENDENT_AMBULATORY_CARE_PROVIDER_SITE_OTHER): Payer: Managed Care, Other (non HMO) | Admitting: Neurology

## 2019-10-16 ENCOUNTER — Other Ambulatory Visit: Payer: Self-pay

## 2019-10-16 ENCOUNTER — Encounter: Payer: Self-pay | Admitting: Neurology

## 2019-10-16 VITALS — BP 133/88 | HR 85 | Ht 64.0 in | Wt 292.0 lb

## 2019-10-16 DIAGNOSIS — G43111 Migraine with aura, intractable, with status migrainosus: Secondary | ICD-10-CM
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
## 2019-10-16 NOTE — Patient Instructions (Signed)
I think this is a migraine.  I will give you a sample of Nurtec.  It is a dissolvable pill that you put under your tongue.  No more than one tablet in 24 hours.  If you have another episode and it works, contact me and I can send a prescription.  Follow up in 6 months

## 2019-10-17 NOTE — Progress Notes (Addendum)
[facility] [address], [place], [place] [address] [phone]  Date:  10/18/2019   Name:  [name]   DOB:  01-Aug-1971   MRN:  [id number]  PCP:  Darreld Mclean, MD    Chief Complaint: Annual Exam   History of Present Illness:  [name] is a 49 y.o. very pleasant female patient who presents with the following:  Here today for physical exam Patient with history of hypothyroidism, obesity, benign ovarian tumor, suspicious colon polyp Last seen by myself in January 2020-at that time she was having chest tightness, elevated blood pressure We ended up having her seen in the ER at that time due to her chest pain She was cleared, follow-up with cardiology She underwent coronary CT in February 2020 as follows  IMPRESSION: 1. Coronary calcium score of 5. This was 84 percentile for age and sex matched control. 2. Normal coronary origin with right dominance. 3. Minimal non-obstructive CAD. Risk factor modification is recommended. 4. Dilated pulmonary artery measuring 34 mm suggestive of pulmonary hypertension.  She was also seen in the ER in January of this year with concern of facial droop-MRI was reassuring.  Suspected to have a complicated migraine.  She saw Dr. Tomi Likens with neurology on March 15.  He agreed likely migraine etiology, as these are very rare they just gave some samples of Nurtec for now This has not occurred again  A colonoscopy is planned for next week  She is taking losartan and armour thyroid at this time  She had both her covid vaccines already  Her 50 yo son is struggling some with at home learning- she is looking forward to sending him back to in-person schooling when possible   Lab Results  Component Value Date   TSH 4.382 08/25/2018    Patient Active Problem List   Diagnosis Date Noted  . RLQ abdominal pain 05/24/2019  . History of benign ovarian tumor 05/24/2019  .  Personal history of colonic polyps   . H/O colonoscopy with polypectomy   . Acute lower GI bleeding 02/16/2018  . Acute blood loss anemia 02/16/2018  . Abnormal CT of the abdomen 01/12/2018  . Colon cancer screening 01/12/2018  . Obesity, unspecified 07/07/2013  . Hypothyroidism 09/27/2011    Past Medical History:  Diagnosis Date  . Anxiety   . Benign ovarian tumor July 2012   17 pound benign tumor- removed  . Depression   . Hypertension   . Personal history of colonic polyps   . Sleep apnea   . Thyroid disease     Past Surgical History:  Procedure Laterality Date  . ABDOMINAL HYSTERECTOMY  2012   one ovary remains- due to giant benign ovarian cyst (17 lbs)  . COLONOSCOPY WITH PROPOFOL N/A 02/16/2018   Procedure: COLONOSCOPY WITH PROPOFOL;  Surgeon: Jerene Bears, MD;  Location: [facility];  Service: Gastroenterology;  Laterality: N/A;  . OVARIAN CYST REMOVAL      Social History   Tobacco Use  . Smoking status: Never Smoker  . Smokeless tobacco: Never Used  Substance Use Topics  . Alcohol use: No    Alcohol/week: 0.0 standard drinks    Comment: socially  . Drug use: No    Family History  Problem Relation Age of Onset  . Heart disease Father   . Stroke Father   . Heart attack Father   . Hyperlipidemia Father   . Hypertension Father   .  Colon polyps Father        benign  . Heart disease Paternal Grandfather   . Heart attack Paternal Grandfather   . COPD Mother   . Congestive Heart Failure Mother   . Lung cancer Mother        highly suspected, waiting on definitive bx 03/14/18  . Gallbladder disease Maternal Grandmother   . Stroke Paternal Grandmother   . Colon cancer Neg Hx   . Stomach cancer Neg Hx     Allergies  Allergen Reactions  . Levothyroxine Sodium Anxiety and Palpitations  . Lisinopril Cough  . Levothyroxine Palpitations    Medication list has been reviewed and updated.  Current Outpatient Medications on File Prior to Visit  Medication  Sig Dispense Refill  . Melatonin Gummies 2.5 MG CHEW Chew 2.5 mg by mouth at bedtime.     . Multiple Vitamins-Minerals (MULTIVITAMIN WITH MINERALS) tablet Take 1 tablet by mouth at bedtime. Reported on 07/24/2015    . thyroid (ARMOUR THYROID) 30 MG tablet TAKE 2 AND 1/2 TABLETS BY MOUTH EVERY DAY BEFORE BREAKFAST 225 tablet 1  . losartan (COZAAR) 25 MG tablet Take 1 tablet (25 mg total) by mouth daily. 90 tablet 3   No current facility-administered medications on file prior to visit.    Review of Systems:  As per HPI- otherwise negative.   Physical Examination: Vitals:   10/18/19 1422  BP: 140/88  Pulse: 82  Resp: 18  Temp: (!) 96.9 F (36.1 C)  SpO2: 97%   Vitals:   10/18/19 1422  Weight: 291 lb (132 kg)  Height: 5\' 4"  (1.626 m)   Body mass index is 49.95 kg/m. Ideal Body Weight: Weight in (lb) to have BMI = 25: 145.3  GEN: no acute distress.  Obese, otherwise looks well HEENT: Atraumatic, Normocephalic.  Ears and Nose: No external deformity. CV: RRR, No M/G/R. No JVD. No thrill. No extra heart sounds. PULM: CTA B, no wheezes, crackles, rhonchi. No retractions. No resp. distress. No accessory muscle use. ABD: S, NT, ND, +BS. No rebound. No HSM. EXTR: No c/c/e PSYCH: Normally interactive. Conversant.    Assessment and Plan: Physical exam - Plan: Lipid panel  Acquired hypothyroidism - Plan: TSH  Pre-diabetes - Plan: Hemoglobin A1c  Essential hypertension - Plan: losartan (COZAAR) 25 MG tablet  Here today for routine physical Blood pressure is under okay control, refill losartan Check TSH today, adjust thyroid placement if needed Lipid panel pending Her Pap as per GYN, mammogram up-to-date Colonoscopy scheduled Reminded to have shingles vaccine after age 42, Covid series complete  Will plan further follow- up pending labs.  This visit occurred during the SARS-CoV-2 public health emergency.  Safety protocols were in place, including screening questions prior  to the visit, additional usage of staff PPE, and extensive cleaning of exam room while observing appropriate contact time as indicated for disinfecting solutions.    Signed Lamar Blinks, MD   Received her labs 3/18- message to pt  Results for orders placed or performed in visit on 10/18/19  TSH  Result Value Ref Range   TSH 7.94 (H) 0.35 - 4.50 uIU/mL  Lipid panel  Result Value Ref Range   Cholesterol 189 0 - 200 mg/dL   Triglycerides 169.0 (H) 0.0 - 149.0 mg/dL   HDL 37.60 (L) >39.00 mg/dL   VLDL 33.8 0.0 - 40.0 mg/dL   LDL Cholesterol 118 (H) 0 - 99 mg/dL   Total CHOL/HDL Ratio 5    NonHDL 151.47  Hemoglobin A1c  Result Value Ref Range   Hgb A1c MFr Bld 6.6 (H) 4.6 - 6.5 %

## 2019-10-18 ENCOUNTER — Other Ambulatory Visit: Payer: Self-pay

## 2019-10-18 ENCOUNTER — Ambulatory Visit (INDEPENDENT_AMBULATORY_CARE_PROVIDER_SITE_OTHER): Payer: Managed Care, Other (non HMO) | Admitting: Family Medicine

## 2019-10-18 ENCOUNTER — Encounter: Payer: Self-pay | Admitting: Family Medicine

## 2019-10-18 VITALS — BP 140/88 | HR 82 | Temp 96.9°F | Resp 18 | Ht 64.0 in | Wt 291.0 lb

## 2019-10-18 DIAGNOSIS — E039 Hypothyroidism, unspecified: Secondary | ICD-10-CM

## 2019-10-18 DIAGNOSIS — I10 Essential (primary) hypertension: Secondary | ICD-10-CM | POA: Diagnosis not present

## 2019-10-18 DIAGNOSIS — Z0000 Encounter for general adult medical examination without abnormal findings: Secondary | ICD-10-CM | POA: Diagnosis not present

## 2019-10-18 DIAGNOSIS — R7303 Prediabetes: Secondary | ICD-10-CM | POA: Diagnosis not present

## 2019-10-18 MED ORDER — LOSARTAN POTASSIUM 25 MG PO TABS: 25.0000 mg | ORAL_TABLET | Freq: Every day | ORAL | 3 refills | Status: DC

## 2019-10-18 NOTE — Patient Instructions (Addendum)
It was great to see you again today, I will be in touch with your labs as soon as possible Continue losartan I will let you know if any adjustment of your thyroid dose is needed Plan to have the shingles vaccine next year  Take care!    Health Maintenance, Female Adopting a healthy lifestyle and getting preventive care are important in promoting health and wellness. Ask your health care provider about:  The right schedule for you to have regular tests and exams.  Things you can do on your own to prevent diseases and keep yourself healthy. What should I know about diet, weight, and exercise? Eat a healthy diet   Eat a diet that includes plenty of vegetables, fruits, low-fat dairy products, and lean protein.  Do not eat a lot of foods that are high in solid fats, added sugars, or sodium. Maintain a healthy weight Body mass index (BMI) is used to identify weight problems. It estimates body fat based on height and weight. Your health care provider can help determine your BMI and help you achieve or maintain a healthy weight. Get regular exercise Get regular exercise. This is one of the most important things you can do for your health. Most adults should:  Exercise for at least 150 minutes each week. The exercise should increase your heart rate and make you sweat (moderate-intensity exercise).  Do strengthening exercises at least twice a week. This is in addition to the moderate-intensity exercise.  Spend less time sitting. Even light physical activity can be beneficial. Watch cholesterol and blood lipids Have your blood tested for lipids and cholesterol at 50 years of age, then have this test every 5 years. Have your cholesterol levels checked more often if:  Your lipid or cholesterol levels are high.  You are older than 50 years of age.  You are at high risk for heart disease. What should I know about cancer screening? Depending on your health history and family history, you may  need to have cancer screening at various ages. This may include screening for:  Breast cancer.  Cervical cancer.  Colorectal cancer.  Skin cancer.  Lung cancer. What should I know about heart disease, diabetes, and high blood pressure? Blood pressure and heart disease  High blood pressure causes heart disease and increases the risk of stroke. This is more likely to develop in people who have high blood pressure readings, are of African descent, or are overweight.  Have your blood pressure checked: ? Every 3-5 years if you are 39-61 years of age. ? Every year if you are 21 years old or older. Diabetes Have regular diabetes screenings. This checks your fasting blood sugar level. Have the screening done:  Once every three years after age 50 if you are at a normal weight and have a low risk for diabetes.  More often and at a younger age if you are overweight or have a high risk for diabetes. What should I know about preventing infection? Hepatitis B If you have a higher risk for hepatitis B, you should be screened for this virus. Talk with your health care provider to find out if you are at risk for hepatitis B infection. Hepatitis C Testing is recommended for:  Everyone born from [birth year] through 1966.  Anyone with known risk factors for hepatitis C. Sexually transmitted infections (STIs)  Get screened for STIs, including gonorrhea and chlamydia, if: ? You are sexually active and are younger than 50 years of age. ? You are  older than 50 years of age and your health care provider tells you that you are at risk for this type of infection. ? Your sexual activity has changed since you were last screened, and you are at increased risk for chlamydia or gonorrhea. Ask your health care provider if you are at risk.  Ask your health care provider about whether you are at high risk for HIV. Your health care provider may recommend a prescription medicine to help prevent HIV infection. If you  choose to take medicine to prevent HIV, you should first get tested for HIV. You should then be tested every 3 months for as long as you are taking the medicine. Pregnancy  If you are about to stop having your period (premenopausal) and you may become pregnant, seek counseling before you get pregnant.  Take 400 to 800 micrograms (mcg) of folic acid every day if you become pregnant.  Ask for birth control (contraception) if you want to prevent pregnancy. Osteoporosis and menopause Osteoporosis is a disease in which the bones lose minerals and strength with aging. This can result in bone fractures. If you are 50 years old or older, or if you are at risk for osteoporosis and fractures, ask your health care provider if you should:  Be screened for bone loss.  Take a calcium or vitamin D supplement to lower your risk of fractures.  Be given hormone replacement therapy (HRT) to treat symptoms of menopause. Follow these instructions at home: Lifestyle  Do not use any products that contain nicotine or tobacco, such as cigarettes, e-cigarettes, and chewing tobacco. If you need help quitting, ask your health care provider.  Do not use street drugs.  Do not share needles.  Ask your health care provider for help if you need support or information about quitting drugs. Alcohol use  Do not drink alcohol if: ? Your health care provider tells you not to drink. ? You are pregnant, may be pregnant, or are planning to become pregnant.  If you drink alcohol: ? Limit how much you use to 0-1 drink a day. ? Limit intake if you are breastfeeding.  Be aware of how much alcohol is in your drink. In the [place] one drink equals one 12 oz bottle of beer (355 mL), one 5 oz glass of wine (148 mL), or one 1 oz glass of hard liquor (44 mL). General instructions  Schedule regular health, dental, and eye exams.  Stay current with your vaccines.  Tell your health care provider if: ? You often feel  depressed. ? You have ever been abused or do not feel safe at home. Summary  Adopting a healthy lifestyle and getting preventive care are important in promoting health and wellness.  Follow your health care provider's instructions about healthy diet, exercising, and getting tested or screened for diseases.  Follow your health care provider's instructions on monitoring your cholesterol and blood pressure. This information is not intended to replace advice given to you by your health care provider. Make sure you discuss any questions you have with your health care provider. Document Revised: 07/13/2018 Document Reviewed: 07/13/2018 Elsevier Patient Education  2020 [employer].

## 2019-10-19 ENCOUNTER — Encounter: Payer: Self-pay | Admitting: Family Medicine

## 2019-10-19 LAB — LIPID PANEL
Cholesterol: 189 mg/dL (ref 0–200)
HDL: 37.6 mg/dL — ABNORMAL LOW (ref >39.00)
LDL Cholesterol: 118 mg/dL — ABNORMAL HIGH (ref 0–99)
NonHDL: 151.47
Total CHOL/HDL Ratio: 5
Triglycerides: 169 mg/dL — ABNORMAL HIGH (ref 0.0–149.0)
VLDL: 33.8 mg/dL (ref 0.0–40.0)

## 2019-10-19 LAB — HEMOGLOBIN A1C: Hgb A1c MFr Bld: 6.6 % — ABNORMAL HIGH (ref 4.6–6.5)

## 2019-10-19 LAB — TSH: TSH: 7.94 u[IU]/mL — ABNORMAL HIGH (ref 0.35–4.50)

## 2019-10-19 NOTE — Addendum Note (Signed)
Addended by: Lamar Blinks C on: 10/19/2019 12:39 PM   Modules accepted: Orders

## 2019-11-10 ENCOUNTER — Telehealth: Payer: Self-pay | Admitting: *Deleted

## 2019-11-10 DIAGNOSIS — I10 Essential (primary) hypertension: Secondary | ICD-10-CM

## 2019-11-10 DIAGNOSIS — E782 Mixed hyperlipidemia: Secondary | ICD-10-CM

## 2019-11-10 MED ORDER — ROSUVASTATIN CALCIUM 5 MG PO TABS: 5.0000 mg | ORAL_TABLET | Freq: Every day | ORAL | 3 refills | Status: DC

## 2019-11-10 NOTE — Telephone Encounter (Signed)
Per order from Dr Margaretann Loveless  Order placed for rosuvastatin  , will mail lab slip

## 2019-11-10 NOTE — Telephone Encounter (Signed)
-----   Message from Elouise Munroe, MD sent at 11/07/2019  5:56 PM EDT ----- Ivin Booty, I have reviewed her lipids and we should start crestor 5 mg daily.   Repeat lipids and CMP in 3 mo, she can have these done with Dr. Lorelei Pont if she prefers.   Can you move our follow up schedule for 4/13 until 3 mo from when she starts the crestor? Indication is hyperlipidemia and family history of CAD. Thanks, Smitty Cords

## 2019-11-13 ENCOUNTER — Other Ambulatory Visit: Payer: Self-pay | Admitting: *Deleted

## 2019-11-13 DIAGNOSIS — E782 Mixed hyperlipidemia: Secondary | ICD-10-CM

## 2019-11-13 DIAGNOSIS — I10 Essential (primary) hypertension: Secondary | ICD-10-CM

## 2019-11-14 ENCOUNTER — Telehealth: Payer: Managed Care, Other (non HMO) | Admitting: Internal Medicine

## 2019-11-17 ENCOUNTER — Encounter: Payer: Self-pay | Admitting: Family Medicine

## 2019-11-17 ENCOUNTER — Other Ambulatory Visit: Payer: Self-pay

## 2019-11-17 ENCOUNTER — Other Ambulatory Visit (INDEPENDENT_AMBULATORY_CARE_PROVIDER_SITE_OTHER): Payer: Managed Care, Other (non HMO)

## 2019-11-17 DIAGNOSIS — E039 Hypothyroidism, unspecified: Secondary | ICD-10-CM | POA: Diagnosis not present

## 2019-11-17 LAB — TSH: TSH: 3.2 u[IU]/mL (ref 0.35–4.50)

## 2020-01-18 ENCOUNTER — Other Ambulatory Visit: Payer: Self-pay | Admitting: *Deleted

## 2020-01-18 DIAGNOSIS — I10 Essential (primary) hypertension: Secondary | ICD-10-CM

## 2020-01-18 MED ORDER — LOSARTAN POTASSIUM 25 MG PO TABS: 25.0000 mg | ORAL_TABLET | Freq: Every day | ORAL | 2 refills | Status: DC

## 2020-01-18 NOTE — Telephone Encounter (Signed)
Rx has been sent to the pharmacy electronically. ° °

## 2020-01-26 ENCOUNTER — Telehealth: Payer: Managed Care, Other (non HMO) | Admitting: Internal Medicine

## 2020-02-06 LAB — HEPATIC FUNCTION PANEL
ALT: 25 IU/L (ref 0–32)
AST: 18 IU/L (ref 0–40)
Albumin: 4.4 g/dL (ref 3.8–4.8)
Alkaline Phosphatase: 91 IU/L (ref 48–121)
Bilirubin Total: 0.4 mg/dL (ref 0.0–1.2)
Bilirubin, Direct: 0.1 mg/dL (ref 0.00–0.40)
Total Protein: 7.5 g/dL (ref 6.0–8.5)

## 2020-02-06 LAB — LIPID PANEL
Chol/HDL Ratio: 4.3 ratio (ref 0.0–4.4)
Cholesterol, Total: 153 mg/dL (ref 100–199)
HDL: 36 mg/dL — ABNORMAL LOW (ref >39)
LDL Chol Calc (NIH): 88 mg/dL (ref 0–99)
Triglycerides: 166 mg/dL — ABNORMAL HIGH (ref 0–149)
VLDL Cholesterol Cal: 29 mg/dL (ref 5–40)

## 2020-02-07 NOTE — Progress Notes (Signed)
[facility] [address], [place], [place] [address] [phone]  Date:  02/12/2020   Name:  [name]   DOB:  1970-11-27   MRN:  [id number]  PCP:  Darreld Mclean, MD    Chief Complaint: No chief complaint on file.   History of Present Illness:  [name] is a 49 y.o. very pleasant female patient who presents with the following:  Virtual visit today to discuss illness Patient with history of hypothyroidism, benign ovarian tumor status post removal, lower GI bleed 2019 Last seen by myself for physical in March of this year  Patient location is home.  Patient identity confirmed with 2 factors, she gives consent for virtual visit today. The patient and myself are present on the call today   She has completed her Covid series as of March  She has noted an upper respiratory illness for about 3 weeks Will come and go- she will start to feel better and then sicken again She is coughing up some mucus Her sinuses and chest are congested No fever, no ST or earache No vomiting or diarrhea She did not get a covid test  Her mom and wife had similar sx until they were treated with abx Her sx are now more in her chest thank you but also in her sinuses  Patient Active Problem List   Diagnosis Date Noted  . RLQ abdominal pain 05/24/2019  . History of benign ovarian tumor 05/24/2019  . Personal history of colonic polyps   . H/O colonoscopy with polypectomy   . Acute lower GI bleeding 02/16/2018  . Acute blood loss anemia 02/16/2018  . Abnormal CT of the abdomen 01/12/2018  . Colon cancer screening 01/12/2018  . Obesity, unspecified 07/07/2013  . Hypothyroidism 09/27/2011    Past Medical History:  Diagnosis Date  . Anxiety   . Benign ovarian tumor July 2012   17 pound benign tumor- removed  . Depression   . Hypertension   . Personal history of colonic polyps   . Sleep apnea   . Thyroid disease      Past Surgical History:  Procedure Laterality Date  . ABDOMINAL HYSTERECTOMY  2012   one ovary remains- due to giant benign ovarian cyst (17 lbs)  . COLONOSCOPY WITH PROPOFOL N/A 02/16/2018   Procedure: COLONOSCOPY WITH PROPOFOL;  Surgeon: Jerene Bears, MD;  Location: [facility];  Service: Gastroenterology;  Laterality: N/A;  . OVARIAN CYST REMOVAL      Social History   Tobacco Use  . Smoking status: Never Smoker  . Smokeless tobacco: Never Used  Vaping Use  . Vaping Use: Never used  Substance Use Topics  . Alcohol use: No    Alcohol/week: 0.0 standard drinks    Comment: socially  . Drug use: No    Family History  Problem Relation Age of Onset  . Heart disease Father   . Stroke Father   . Heart attack Father   . Hyperlipidemia Father   . Hypertension Father   . Colon polyps Father        benign  . Heart disease Paternal Grandfather   . Heart attack Paternal Grandfather   . COPD Mother   . Congestive Heart Failure Mother   . Lung cancer Mother        highly suspected, waiting on definitive bx 03/14/18  . Gallbladder disease Maternal Grandmother   . Stroke Paternal Grandmother   .  Colon cancer Neg Hx   . Stomach cancer Neg Hx     Allergies  Allergen Reactions  . Levothyroxine Sodium Anxiety and Palpitations  . Lisinopril Cough  . Levothyroxine Palpitations    Medication list has been reviewed and updated.  Current Outpatient Medications on File Prior to Visit  Medication Sig Dispense Refill  . losartan (COZAAR) 25 MG tablet Take 1 tablet (25 mg total) by mouth daily. 90 tablet 2  . Melatonin Gummies 2.5 MG CHEW Chew 2.5 mg by mouth at bedtime.     . Multiple Vitamins-Minerals (MULTIVITAMIN WITH MINERALS) tablet Take 1 tablet by mouth at bedtime. Reported on 07/24/2015    . rosuvastatin (CRESTOR) 5 MG tablet Take 1 tablet (5 mg total) by mouth daily. 90 tablet 3  . thyroid (ARMOUR THYROID) 30 MG tablet TAKE 2 AND 1/2 TABLETS BY MOUTH EVERY DAY BEFORE  BREAKFAST 225 tablet 1   No current facility-administered medications on file prior to visit.    Review of Systems:  As per HPI- otherwise negative.   Physical Examination: There were no vitals filed for this visit. There were no vitals filed for this visit. There is no height or weight on file to calculate BMI. Ideal Body Weight:    Pt observed via video monitor- she looks well  Not checking VS BP has been under good control, recent visit with cardiology  They did change her cholesterol med dosage -increased-she hopes she will be able to tolerate  Assessment and Plan: Acute non-recurrent frontal sinusitis - Plan: doxycycline (VIBRAMYCIN) 100 MG capsule, fluconazole (DIFLUCAN) 150 MG tablet  Virtual visit today with sx of sinuitis/ bronchitis. Will treat with doxycycline twice a day for 10 days.  Asked her to let me know if not feeling better over the next several days, Sooner if worse.    Signed Lamar Blinks, MD

## 2020-02-08 ENCOUNTER — Encounter: Payer: Self-pay | Admitting: Internal Medicine

## 2020-02-08 ENCOUNTER — Telehealth (INDEPENDENT_AMBULATORY_CARE_PROVIDER_SITE_OTHER): Payer: Managed Care, Other (non HMO) | Admitting: Internal Medicine

## 2020-02-08 ENCOUNTER — Telehealth: Payer: Self-pay

## 2020-02-08 VITALS — BP 130/90 | HR 73 | Ht 64.0 in | Wt 292.0 lb

## 2020-02-08 DIAGNOSIS — R0789 Other chest pain: Secondary | ICD-10-CM

## 2020-02-08 DIAGNOSIS — I10 Essential (primary) hypertension: Secondary | ICD-10-CM

## 2020-02-08 DIAGNOSIS — I2584 Coronary atherosclerosis due to calcified coronary lesion: Secondary | ICD-10-CM

## 2020-02-08 DIAGNOSIS — E782 Mixed hyperlipidemia: Secondary | ICD-10-CM

## 2020-02-08 DIAGNOSIS — Z79899 Other long term (current) drug therapy: Secondary | ICD-10-CM

## 2020-02-08 DIAGNOSIS — Z6841 Body Mass Index (BMI) 40.0 and over, adult: Secondary | ICD-10-CM

## 2020-02-08 DIAGNOSIS — I2510 Atherosclerotic heart disease of native coronary artery without angina pectoris: Secondary | ICD-10-CM

## 2020-02-08 MED ORDER — ROSUVASTATIN CALCIUM 20 MG PO TABS: 20.0000 mg | ORAL_TABLET | Freq: Every day | ORAL | 1 refills | Status: DC

## 2020-02-08 NOTE — Progress Notes (Signed)
Virtual Visit via Telephone Note   This visit type was conducted due to national recommendations for restrictions regarding the COVID-19 Pandemic (e.g. social distancing) in an effort to limit this patient's exposure and mitigate transmission in our community.  Due to her co-morbid illnesses, this patient is at least at moderate risk for complications without adequate follow up.  This format is felt to be most appropriate for this patient at this time.  The patient did not have access to video technology/had technical difficulties with video requiring transitioning to audio format only (telephone).  All issues noted in this document were discussed and addressed.  No physical exam could be performed with this format.  Please refer to the patient's chart for her  consent to telehealth for [facility].   The patient was identified using 2 identifiers.  Date:  02/08/2020   ID:  [name], DOB January 19, 1971, MRN [id number]  Patient Location: Home Provider Location: Home Office  PCP:  Copland, Gay Filler, MD  Cardiologist:  Elouise Munroe, MD  Electrophysiologist:  None   Evaluation Performed:  Follow-Up Visit  Chief Complaint:  Follow up chest pain, risk factors  History of Present Illness:    [name] is a 49 y.o. female with benign ovarian tumor status post hysterectomy and oophorectomy, colon polyp status post excision, and weight gain who presents today for follow up of chest pain, HTN, and HLD.   Based on review of lipids from March, we started crestor 5 mg daily in April. LDL 118, Trig 169, HDL 37.6. We planned to recheck labs 3 mos after and review today.   LDL has improved, 88. Trigs still elevated at 166. HDL stable 36, low. LFTs unremarkable. Mild CAC to keep in mind.   BP doing well on losartan 25 mg daily.   The patient does not have symptoms concerning for COVID-19 infection (fever, chills, cough, or new shortness of breath).    Past Medical History:    Diagnosis Date  . Anxiety   . Benign ovarian tumor July 2012   17 pound benign tumor- removed  . Depression   . Hypertension   . Personal history of colonic polyps   . Sleep apnea   . Thyroid disease    Past Surgical History:  Procedure Laterality Date  . ABDOMINAL HYSTERECTOMY  2012   one ovary remains- due to giant benign ovarian cyst (17 lbs)  . COLONOSCOPY WITH PROPOFOL N/A 02/16/2018   Procedure: COLONOSCOPY WITH PROPOFOL;  Surgeon: Jerene Bears, MD;  Location: [facility];  Service: Gastroenterology;  Laterality: N/A;  . OVARIAN CYST REMOVAL       Current Meds  Medication Sig  . losartan (COZAAR) 25 MG tablet Take 1 tablet (25 mg total) by mouth daily.  . Melatonin Gummies 2.5 MG CHEW Chew 2.5 mg by mouth at bedtime.   . Multiple Vitamins-Minerals (MULTIVITAMIN WITH MINERALS) tablet Take 1 tablet by mouth at bedtime. Reported on 07/24/2015  . rosuvastatin (CRESTOR) 5 MG tablet Take 1 tablet (5 mg total) by mouth daily.  Marland Kitchen thyroid (ARMOUR THYROID) 30 MG tablet TAKE 2 AND 1/2 TABLETS BY MOUTH EVERY DAY BEFORE BREAKFAST     Allergies:   Levothyroxine sodium, Lisinopril, and Levothyroxine   Social History   Tobacco Use  . Smoking status: Never Smoker  . Smokeless tobacco: Never Used  Vaping Use  . Vaping Use: Never used  Substance Use Topics  . Alcohol use: No    Alcohol/week: 0.0 standard drinks  Comment: socially  . Drug use: No     Family Hx: The patient's family history includes COPD in her mother; Colon polyps in her father; Congestive Heart Failure in her mother; Gallbladder disease in her maternal grandmother; Heart attack in her father and paternal grandfather; Heart disease in her father and paternal grandfather; Hyperlipidemia in her father; Hypertension in her father; Lung cancer in her mother; Stroke in her father and paternal grandmother. There is no history of Colon cancer or Stomach cancer.  ROS:   Please see the history of present illness.      All other systems reviewed and are negative.   Prior CV studies:   The following studies were reviewed today:    Labs/Other Tests and Data Reviewed:    EKG:  No ECG reviewed.  Recent Labs: 08/29/2019: BUN 14; Creatinine, Ser 0.63; Hemoglobin 14.2; Platelets 312; Potassium 4.8; Sodium 137 11/17/2019: TSH 3.20 02/06/2020: ALT 25   Recent Lipid Panel Lab Results  Component Value Date/Time   CHOL 153 02/06/2020 09:47 AM   TRIG 166 (H) 02/06/2020 09:47 AM   HDL 36 (L) 02/06/2020 09:47 AM   CHOLHDL 4.3 02/06/2020 09:47 AM   CHOLHDL 5 10/18/2019 02:45 PM   LDLCALC 88 02/06/2020 09:47 AM   LDLDIRECT 124 (H) 05/24/2013 10:06 AM    Wt Readings from Last 3 Encounters:  10/18/19 291 lb (132 kg)  10/16/19 292 lb (132.5 kg)  08/29/19 289 lb (131.1 kg)     Objective:    Vital Signs:  BP 130/90   Pulse 73   Ht 5\' 4"  (1.626 m)   Wt 292 lb (132.5 kg)   BMI 50.12 kg/m    VITAL SIGNS:  reviewed GEN:  no acute distress RESPIRATORY:  normal respiratory effort, no increased work of breathing NEURO:  alert and oriented x 3, speech normal PSYCH:  normal affect   ASSESSMENT & PLAN:    CAC Mixed hyperlipidemia - Plan: rosuvastatin (CRESTOR) 20 MG tablet -We will intensify statin therapy today, increase rosuvastatin to 20 mg daily.  We will try to get LDL less than 70 and improve triglycerides.  We discussed dietary modifications that can assist with this as well.  This is an appropriate dose of Crestor for secondary prevention of mild coronary artery calcifications suggesting coronary artery disease.  Essential hypertension -Continue losartan 25 mg daily  Atypical chest pain-no significant recurrence.  Continue to monitor.    COVID-19 Education: The signs and symptoms of COVID-19 were discussed with the patient and how to seek care for testing (follow up with PCP or arrange E-visit).  The importance of social distancing was discussed today.  Time:   Today, I have spent 15  minutes with the patient with telehealth technology discussing the above problems.     Medication Adjustments/Labs and Tests Ordered: Current medicines are reviewed at length with the patient today.  Concerns regarding medicines are outlined above.   Patient Instructions  Medication Instructions:   Your physician has recommended you make the following change in your medication:   1) Increase Crestor to 20 mg, 1 tablet by mouth once a day  *If you need a refill on your cardiac medications before your next appointment, please call your pharmacy*  Lab Work:  None ordered today  Testing/Procedures:  None ordered today  Follow-Up: At [facility], you and your health needs are our priority.  As part of our continuing mission to provide you with exceptional heart care, we have created designated Provider Care  Teams.  These Care Teams include your primary Cardiologist (physician) and Advanced Practice Providers (APPs -  Physician Assistants and Nurse Practitioners) who all work together to provide you with the care you need, when you need it.  We recommend signing up for the patient portal called "MyChart".  Sign up information is provided on this After Visit Summary.  MyChart is used to connect with patients for Virtual Visits (Telemedicine).  Patients are able to view lab/test results, encounter notes, upcoming appointments, etc.  Non-urgent messages can be sent to your provider as well.   To learn more about what you can do with MyChart, go to [email or web address].    Your next appointment:   6 month(s)  The format for your next appointment:   In Person  Provider:   You may see Elouise Munroe, MD or one of the following Advanced Practice Providers on your designated Care Team:    Rosaria Ferries, PA-C  Jory Sims, DNP, ANP  Cadence Kathlen Mody, NP      Signed, Elouise Munroe, MD  02/08/2020 7:58 AM    [place]

## 2020-02-08 NOTE — Telephone Encounter (Signed)
  Patient Consent for Virtual Visit         [name] has provided verbal consent on 02/08/2020 for a virtual visit (video or telephone).   CONSENT FOR VIRTUAL VISIT FOR:  [name]  By participating in this virtual visit I agree to the following:  I hereby voluntarily request, consent and authorize [facility] and its employed or contracted physicians, physician assistants, nurse practitioners or other licensed health care professionals (the Practitioner), to provide me with telemedicine health care services (the "Services") as deemed necessary by the treating Practitioner. I acknowledge and consent to receive the Services by the Practitioner via telemedicine. I understand that the telemedicine visit will involve communicating with the Practitioner through live audiovisual communication technology and the disclosure of certain medical information by electronic transmission. I acknowledge that I have been given the opportunity to request an in-person assessment or other available alternative prior to the telemedicine visit and am voluntarily participating in the telemedicine visit.  I understand that I have the right to withhold or withdraw my consent to the use of telemedicine in the course of my care at any time, without affecting my right to future care or treatment, and that the Practitioner or I may terminate the telemedicine visit at any time. I understand that I have the right to inspect all information obtained and/or recorded in the course of the telemedicine visit and may receive copies of available information for a reasonable fee.  I understand that some of the potential risks of receiving the Services via telemedicine include:  Marland Kitchen Delay or interruption in medical evaluation due to technological equipment failure or disruption; . Information transmitted may not be sufficient (e.g. poor resolution of images) to allow for appropriate medical decision making by the Practitioner;  and/or  . In rare instances, security protocols could fail, causing a breach of personal health information.  Furthermore, I acknowledge that it is my responsibility to provide information about my medical history, conditions and care that is complete and accurate to the best of my ability. I acknowledge that Practitioner's advice, recommendations, and/or decision may be based on factors not within their control, such as incomplete or inaccurate data provided by me or distortions of diagnostic images or specimens that may result from electronic transmissions. I understand that the practice of medicine is not an exact science and that Practitioner makes no warranties or guarantees regarding treatment outcomes. I acknowledge that a copy of this consent can be made available to me via my patient portal ([facility]), or I can request a printed copy by calling the office of [facility].    I understand that my insurance will be billed for this visit.   I have read or had this consent read to me. . I understand the contents of this consent, which adequately explains the benefits and risks of the Services being provided via telemedicine.  . I have been provided ample opportunity to ask questions regarding this consent and the Services and have had my questions answered to my satisfaction. . I give my informed consent for the services to be provided through the use of telemedicine in my medical care

## 2020-02-08 NOTE — Patient Instructions (Signed)
Medication Instructions:   Your physician has recommended you make the following change in your medication:   1) Increase Crestor to 20 mg, 1 tablet by mouth once a day  *If you need a refill on your cardiac medications before your next appointment, please call your pharmacy*  Lab Work:  None ordered today  Testing/Procedures:  None ordered today  Follow-Up: At [facility], you and your health needs are our priority.  As part of our continuing mission to provide you with exceptional heart care, we have created designated Provider Care Teams.  These Care Teams include your primary Cardiologist (physician) and Advanced Practice Providers (APPs -  Physician Assistants and Nurse Practitioners) who all work together to provide you with the care you need, when you need it.  We recommend signing up for the patient portal called "MyChart".  Sign up information is provided on this After Visit Summary.  MyChart is used to connect with patients for Virtual Visits (Telemedicine).  Patients are able to view lab/test results, encounter notes, upcoming appointments, etc.  Non-urgent messages can be sent to your provider as well.   To learn more about what you can do with MyChart, go to [email or web address].    Your next appointment:   6 month(s)  The format for your next appointment:   In Person  Provider:   You may see Elouise Munroe, MD or one of the following Advanced Practice Providers on your designated Care Team:    Rosaria Ferries, PA-C  Jory Sims, DNP, ANP  Cadence Kathlen Mody, NP

## 2020-02-12 ENCOUNTER — Other Ambulatory Visit: Payer: Self-pay

## 2020-02-12 ENCOUNTER — Telehealth (INDEPENDENT_AMBULATORY_CARE_PROVIDER_SITE_OTHER): Payer: Managed Care, Other (non HMO) | Admitting: Family Medicine

## 2020-02-12 ENCOUNTER — Encounter: Payer: Self-pay | Admitting: Family Medicine

## 2020-02-12 DIAGNOSIS — J0110 Acute frontal sinusitis, unspecified: Secondary | ICD-10-CM | POA: Diagnosis not present

## 2020-02-12 MED ORDER — FLUCONAZOLE 150 MG PO TABS: 150.0000 mg | ORAL_TABLET | Freq: Once | ORAL | 0 refills | Status: AC

## 2020-02-12 MED ORDER — DOXYCYCLINE HYCLATE 100 MG PO CAPS: 100.0000 mg | ORAL_CAPSULE | Freq: Two times a day (BID) | ORAL | 0 refills | Status: DC

## 2020-04-17 ENCOUNTER — Ambulatory Visit: Payer: Managed Care, Other (non HMO) | Admitting: Neurology
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
## 2020-05-01 ENCOUNTER — Encounter: Payer: Self-pay | Admitting: Neurology

## 2020-05-01 ENCOUNTER — Ambulatory Visit (INDEPENDENT_AMBULATORY_CARE_PROVIDER_SITE_OTHER): Payer: Managed Care, Other (non HMO) | Admitting: Neurology

## 2020-05-01 ENCOUNTER — Other Ambulatory Visit: Payer: Self-pay

## 2020-05-01 VITALS — BP 159/93 | HR 76 | Resp 18 | Ht 64.0 in | Wt 297.0 lb

## 2020-05-01 DIAGNOSIS — G43109 Migraine with aura, not intractable, without status migrainosus: Secondary | ICD-10-CM

## 2020-05-01 MED ORDER — NURTEC 75 MG PO TBDP: 75.0000 mg | ORAL_TABLET | Freq: Every day | ORAL | 11 refills | Status: DC | PRN

## 2020-05-01 NOTE — Patient Instructions (Signed)
Sent prescription for Nurtec to [facility] mail order pharmacy.  Watch for a text to confirm.

## 2020-05-28 ENCOUNTER — Other Ambulatory Visit: Payer: Managed Care, Other (non HMO)

## 2020-05-28 DIAGNOSIS — Z20822 Contact with and (suspected) exposure to covid-19: Secondary | ICD-10-CM

## 2020-05-29 LAB — NOVEL CORONAVIRUS, NAA: SARS-CoV-2, NAA: NOT DETECTED

## 2020-05-29 LAB — SARS-COV-2, NAA 2 DAY TAT

## 2020-06-08 ENCOUNTER — Other Ambulatory Visit: Payer: Managed Care, Other (non HMO)

## 2020-06-08 DIAGNOSIS — Z20822 Contact with and (suspected) exposure to covid-19: Secondary | ICD-10-CM

## 2020-06-09 LAB — SARS-COV-2, NAA 2 DAY TAT

## 2020-06-09 LAB — NOVEL CORONAVIRUS, NAA: SARS-CoV-2, NAA: NOT DETECTED

## 2020-07-11 ENCOUNTER — Other Ambulatory Visit: Payer: Self-pay | Admitting: Family Medicine

## 2020-07-11 DIAGNOSIS — Z1231 Encounter for screening mammogram for malignant neoplasm of breast: Secondary | ICD-10-CM

## 2020-08-06 ENCOUNTER — Ambulatory Visit: Payer: Managed Care, Other (non HMO) | Admitting: Internal Medicine

## 2020-08-26 ENCOUNTER — Ambulatory Visit: Payer: Managed Care, Other (non HMO)

## 2020-08-30 ENCOUNTER — Encounter: Payer: Self-pay | Admitting: Internal Medicine

## 2020-08-30 ENCOUNTER — Ambulatory Visit (INDEPENDENT_AMBULATORY_CARE_PROVIDER_SITE_OTHER): Payer: Managed Care, Other (non HMO) | Admitting: Internal Medicine

## 2020-08-30 ENCOUNTER — Other Ambulatory Visit: Payer: Self-pay

## 2020-08-30 VITALS — BP 152/90 | HR 73 | Ht 64.0 in | Wt 278.0 lb

## 2020-08-30 DIAGNOSIS — I2510 Atherosclerotic heart disease of native coronary artery without angina pectoris: Secondary | ICD-10-CM

## 2020-08-30 DIAGNOSIS — I2584 Coronary atherosclerosis due to calcified coronary lesion: Secondary | ICD-10-CM

## 2020-08-30 DIAGNOSIS — Z79899 Other long term (current) drug therapy: Secondary | ICD-10-CM

## 2020-08-30 DIAGNOSIS — E782 Mixed hyperlipidemia: Secondary | ICD-10-CM

## 2020-08-30 DIAGNOSIS — Z6841 Body Mass Index (BMI) 40.0 and over, adult: Secondary | ICD-10-CM

## 2020-08-30 DIAGNOSIS — I10 Essential (primary) hypertension: Secondary | ICD-10-CM | POA: Diagnosis not present

## 2020-08-30 DIAGNOSIS — R0789 Other chest pain: Secondary | ICD-10-CM | POA: Diagnosis not present

## 2020-08-30 NOTE — Patient Instructions (Signed)

## 2020-08-30 NOTE — Progress Notes (Signed)
Cardiology Office Note:    Date:  08/30/2020   ID:  [name], DOB March 07, 1971, MRN [id number]  PCP:  Sierra Mclean, MD  Cardiologist:  Elouise Munroe, MD  Electrophysiologist:  None   Referring MD: Sierra Mclean, MD   Chief Complaint/Reason for Referral: HTN, HLD, chest pain  History of Present Illness:    [name] is a 50 y.o. female with a history of benign ovarian tumor status post hysterectomy and oophorectomy, colon polyp status post excision, and weight gain who presents today for follow up of chest pain, HTN, and HLD.    Life and work stressors noted, but overall doing well. No recurrence of chest pain, no SOB. BP slightly elevated in light of stress, but remains mostly under 140/90. Tolerating losartan 25 mg daily.   Doing well on crestor 20 mg daily. Trigs and LDL slightly above goal. She has made strides in her diet since Jan 1, and sees her PCP in March. We discussed uptitration of statin in March if lipids remain above goal at that visit. LDL goal <70.   She is walking daily, and playing basketball with her son.    Past Medical History:  Diagnosis Date  . Anxiety   . Benign ovarian tumor July 2012   17 pound benign tumor- removed  . Depression   . Hypertension   . Personal history of colonic polyps   . Sleep apnea   . Thyroid disease     Past Surgical History:  Procedure Laterality Date  . ABDOMINAL HYSTERECTOMY  2012   one ovary remains- due to giant benign ovarian cyst (17 lbs)  . COLONOSCOPY WITH PROPOFOL N/A 02/16/2018   Procedure: COLONOSCOPY WITH PROPOFOL;  Surgeon: Sierra Bears, MD;  Location: [facility];  Service: Gastroenterology;  Laterality: N/A;  . OVARIAN CYST REMOVAL      Current Medications: Current Meds  Medication Sig  . Melatonin Gummies 2.5 MG CHEW Chew 2.5 mg by mouth at bedtime.   . Multiple Vitamins-Minerals (MULTIVITAMIN WITH MINERALS) tablet Take 1 tablet by mouth at bedtime. Reported on 07/24/2015  .  Rimegepant Sulfate (NURTEC) 75 MG TBDP Take 75 mg by mouth daily as needed (Maximum 1 tablet in 24 hours.).  Marland Kitchen rosuvastatin (CRESTOR) 20 MG tablet Take 1 tablet (20 mg total) by mouth daily.  Marland Kitchen thyroid (ARMOUR THYROID) 30 MG tablet TAKE 2 AND 1/2 TABLETS BY MOUTH EVERY DAY BEFORE BREAKFAST     Allergies:   Levothyroxine sodium, Lisinopril, and Levothyroxine   Social History   Tobacco Use  . Smoking status: Never Smoker  . Smokeless tobacco: Never Used  Vaping Use  . Vaping Use: Never used  Substance Use Topics  . Alcohol use: No    Alcohol/week: 0.0 standard drinks    Comment: socially  . Drug use: No     Family History: The patient's family history includes COPD in her mother; Colon polyps in her father; Congestive Heart Failure in her mother; Gallbladder disease in her maternal grandmother; Heart attack in her father and paternal grandfather; Heart disease in her father and paternal grandfather; Hyperlipidemia in her father; Hypertension in her father; Lung cancer in her mother; Stroke in her father and paternal grandmother. There is no history of Colon cancer or Stomach cancer.  ROS:   Please see the history of present illness.    All other systems reviewed and are negative.  EKGs/Labs/Other Studies Reviewed:    The following studies were reviewed today:  EKG:  NSR, LVH   Recent Labs: 11/17/2019: TSH 3.20 02/06/2020: ALT 25  Recent Lipid Panel    Component Value Date/Time   CHOL 153 02/06/2020 0947   TRIG 166 (H) 02/06/2020 0947   HDL 36 (L) 02/06/2020 0947   CHOLHDL 4.3 02/06/2020 0947   CHOLHDL 5 10/18/2019 1445   VLDL 33.8 10/18/2019 1445   LDLCALC 88 02/06/2020 0947   LDLDIRECT 124 (H) 05/24/2013 1006    Physical Exam:    VS:  BP (!) 152/90 (BP Location: Left Arm, Patient Position: Sitting)   Pulse 73   Ht 5\' 4"  (1.626 m)   Wt 278 lb (126.1 kg)   SpO2 98%   BMI 47.72 kg/m     Wt Readings from Last 5 Encounters:  08/30/20 278 lb (126.1 kg)  05/01/20  297 lb (134.7 kg)  02/08/20 292 lb (132.5 kg)  10/18/19 291 lb (132 kg)  10/16/19 292 lb (132.5 kg)    Constitutional: No acute distress Eyes: sclera non-icteric, normal conjunctiva and lids ENMT: normal dentition, moist mucous membranes Cardiovascular: regular rhythm, normal rate, no murmurs. S1 and S2 normal. Radial pulses normal bilaterally. No jugular venous distention.  Respiratory: clear to auscultation bilaterally GI : normal bowel sounds, soft and nontender. No distention.   MSK: extremities warm, well perfused. No edema.  NEURO: grossly nonfocal exam, moves all extremities. PSYCH: alert and oriented x 3, normal mood and affect.   ASSESSMENT:    1. Essential hypertension   2. Mixed hyperlipidemia   3. Atypical chest pain   4. Class 3 severe obesity without serious comorbidity with body mass index (BMI) of 45.0 to 49.9 in adult, unspecified obesity type ([place])   5. Coronary artery calcification   6. Medication management    PLAN:    Essential hypertension - Plan: EKG 12-Lead - continue losartan 25 mg daily. If BP remains >140/90, will uptitrate losartan to 50 mg daily.  Mixed hyperlipidemia - continue crestor 20 mg daily and check lipids in March with PCP. If still slightly above goal would increase crestor to 40 mg.  Atypical chest pain - no recurrence. Minimal nonobstructive CAD on CCTA with minimal Cor cal.   Class 3 severe obesity without serious comorbidity with body mass index (BMI) of 45.0 to 49.9 in adult, unspecified obesity type ([place]) - reviewed diet and lifestyle modification.    Total time of encounter: 30 minutes total time of encounter, including 25 minutes spent in face-to-face patient care on the date of this encounter. This time includes coordination of care and counseling regarding above mentioned problem list. Remainder of non-face-to-face time involved reviewing chart documents/testing relevant to the patient encounter and documentation in the medical  record. I have independently reviewed documentation from referring provider.   Sierra Kaiser, MD [place]  [facility]    Medication Adjustments/Labs and Tests Ordered: Current medicines are reviewed at length with the patient today.  Concerns regarding medicines are outlined above.   Orders Placed This Encounter  Procedures  . EKG 12-Lead    No orders of the defined types were placed in this encounter.   Patient Instructions  Medication Instructions:  No Changes In Medications at this time.  *If you need a refill on your cardiac medications before your next appointment, please call your pharmacy*  Follow-Up: At [facility], you and your health needs are our priority.  As part of our continuing mission to provide you with exceptional heart care, we have created designated Provider Care Teams.  These  Care Teams include your primary Cardiologist (physician) and Advanced Practice Providers (APPs -  Physician Assistants and Nurse Practitioners) who all work together to provide you with the care you need, when you need it.  Your next appointment:   6 month(s)  The format for your next appointment:   In Person  Provider:   Cherlynn Kaiser, MD

## 2020-11-05 ENCOUNTER — Other Ambulatory Visit: Payer: Self-pay | Admitting: Family Medicine

## 2020-11-05 DIAGNOSIS — I10 Essential (primary) hypertension: Secondary | ICD-10-CM

## 2020-11-07 ENCOUNTER — Ambulatory Visit: Payer: Managed Care, Other (non HMO)

## 2020-12-27 ENCOUNTER — Other Ambulatory Visit: Payer: Self-pay

## 2020-12-27 ENCOUNTER — Ambulatory Visit
Admission: RE | Admit: 2020-12-27 | Discharge: 2020-12-27 | Disposition: A | Discharge status: Home / Self Care | Payer: Managed Care, Other (non HMO) | Source: Ambulatory Visit | Attending: Family Medicine | Admitting: Family Medicine

## 2020-12-27 DIAGNOSIS — Z1231 Encounter for screening mammogram for malignant neoplasm of breast: Secondary | ICD-10-CM

## 2021-01-03 ENCOUNTER — Telehealth: Payer: Self-pay | Admitting: Internal Medicine

## 2021-01-03 DIAGNOSIS — E782 Mixed hyperlipidemia: Secondary | ICD-10-CM

## 2021-01-03 MED ORDER — ROSUVASTATIN CALCIUM 20 MG PO TABS: 20.0000 mg | ORAL_TABLET | Freq: Every day | ORAL | 1 refills | Status: DC

## 2021-01-03 NOTE — Telephone Encounter (Signed)
*  STAT* If patient is at the pharmacy, call can be transferred to refill team.   1. Which medications need to be refilled? (please list name of each medication and dose if known) losartan (COZAAR) 25 MG tablet rosuvastatin (CRESTOR) 20 MG tablet  2. Which pharmacy/location (including street and city if local pharmacy) is medication to be sent to?[place], [place]  3. Do they need a 30 day or 90 day supply? 90 day supply   Pt is all out of this medication.PT has a appt scheduled 05/13/2021.

## 2021-01-08 ENCOUNTER — Other Ambulatory Visit: Payer: Self-pay

## 2021-01-08 ENCOUNTER — Telehealth: Payer: Self-pay

## 2021-01-08 ENCOUNTER — Ambulatory Visit (INDEPENDENT_AMBULATORY_CARE_PROVIDER_SITE_OTHER): Payer: Managed Care, Other (non HMO) | Admitting: Family Medicine

## 2021-01-08 VITALS — BP 130/80 | HR 87 | Temp 97.8°F | Resp 18 | Ht 64.0 in | Wt 283.0 lb

## 2021-01-08 DIAGNOSIS — E119 Type 2 diabetes mellitus without complications: Secondary | ICD-10-CM

## 2021-01-08 DIAGNOSIS — E118 Type 2 diabetes mellitus with unspecified complications: Secondary | ICD-10-CM

## 2021-01-08 LAB — GLUCOSE, POCT (MANUAL RESULT ENTRY): POC Glucose: 191 mg/dl — AB (ref 70–99)

## 2021-01-08 MED ORDER — METFORMIN HCL 500 MG PO TABS: 500.0000 mg | ORAL_TABLET | Freq: Two times a day (BID) | ORAL | 3 refills | Status: DC

## 2021-01-08 NOTE — Telephone Encounter (Signed)
Patient called the front office to make an appointment for high glucose readings. I spoke with patient on the phone she states this morning her FBS was 258. She states she just does not feel good and "feels off". Her skin feels clammy and her mouth is very dry. Denies dizziness, shortness of breath or chest pain.   I have placed her on the schedule to see PCP at 2:00 today however I did advise her to proceed to ER if symptoms worsen. She agreed and verbalized understanding.

## 2021-01-08 NOTE — Patient Instructions (Signed)
Good to see you today- as we discussed, you have diabetes We will start you on treatment with metformin.  Start by taking 1 pill in the evening, after about a week and increase to twice a day.  This medication will typically cause some loose stools and gas.  This typically gets better within a few weeks, but let me know if side effects are severe.  Please drink plenty of water as diabetes tends to make you dehydrated.  Also, work towards a diet which is lower in carbohydrates, higher in protein, low-fat dairy, vegetables.  Exercise is also important in controlling her blood sugar  You do not need to regularly check your blood sugars at home.  However, if you notice yourself running higher than 300-400 please let me know  Please plan on an annual eye exam to check on your retinas

## 2021-01-08 NOTE — Progress Notes (Addendum)
[place] at [facility] [address] [place], [place] [address] [phone]  Date:  01/08/2021   Name:  [name]   DOB:  [birth year]   MRN:  [id number]  PCP:  Darreld Mclean, MD    Chief Complaint: Hyperglycemia (Blood sugar running at 258, HA, dry mouth, pt says she has not been dx with DM yet. Her skin feels clammy. She had a yeast infection that she has treated with OTC tx about 3 days ago. Tingling on toes.) and Blood In Stools (Saturday- none since. She had some Diarrhea and upset stomach. No sxs since then.)   History of Present Illness:  [name] is a 50 y.o. very pleasant female patient who presents with the following:  Here today with concern of elevated blood sugar and symptoms of diabetes-history of prediabetes, hypothyroidism, obesity, benign ovarian tumor, suspicious colon polyp Previously diagnosed with prediabetes Last visit with myself was a virtual visit about 1 year ago  Lab Results  Component Value Date   HGBA1C 6.6 (H) 10/18/2019   I did note her to have an elevated A1c as above about 1 year ago; we did not diagnose for with diabetes at that time as this was her first A1c greater than 6.5.  We discussed metformin but she did not decide to start on it at that time  Over the last few days she has noted sx of dry mouth, tips of toes feel tingly, she is tired, feels clammy She took her glucose this am and it was 253- fasting this am  She has been checking her glucose just on occasion recently She did have a vaginal yeast infection earlier this week and used monistat No urinary frequency, not especially thirsty  No CP or SOB   Her sister does have DM- no other family history  Wt Readings from Last 3 Encounters:  01/08/21 283 lb (128.4 kg)  08/30/20 278 lb (126.1 kg)  05/01/20 297 lb (134.7 kg)     Patient Active Problem List   Diagnosis Date Noted   RLQ abdominal pain 05/24/2019   History  of benign ovarian tumor 05/24/2019   Personal history of colonic polyps    H/O colonoscopy with polypectomy    Acute lower GI bleeding 02/16/2018   Acute blood loss anemia 02/16/2018   Abnormal CT of the abdomen 01/12/2018   Colon cancer screening 01/12/2018   Obesity, unspecified 07/07/2013   Hypothyroidism 09/27/2011    Past Medical History:  Diagnosis Date   Anxiety    Benign ovarian tumor July 2012   17 pound benign tumor- removed   Depression    Hypertension    Personal history of colonic polyps    Sleep apnea    Thyroid disease     Past Surgical History:  Procedure Laterality Date   ABDOMINAL HYSTERECTOMY  2012   one ovary remains- due to giant benign ovarian cyst (17 lbs)   COLONOSCOPY WITH PROPOFOL N/A 02/16/2018   Procedure: COLONOSCOPY WITH PROPOFOL;  Surgeon: Jerene Bears, MD;  Location: [facility];  Service: Gastroenterology;  Laterality: N/A;   OVARIAN CYST REMOVAL      Social History   Tobacco Use   Smoking status: Never Smoker   Smokeless tobacco: Never Used  Vaping Use   Vaping Use: Never used  Substance Use Topics   Alcohol use: No    Alcohol/week: 0.0 standard drinks    Comment: socially   Drug use:  No    Family History  Problem Relation Age of Onset   Heart disease Father    Stroke Father    Heart attack Father    Hyperlipidemia Father    Hypertension Father    Colon polyps Father        benign   Heart disease Paternal Grandfather    Heart attack Paternal Grandfather    COPD Mother    Congestive Heart Failure Mother    Lung cancer Mother        highly suspected, waiting on definitive bx 03/14/18   Gallbladder disease Maternal Grandmother    Stroke Paternal Grandmother    Colon cancer Neg Hx    Stomach cancer Neg Hx     Allergies  Allergen Reactions   Levothyroxine Sodium Anxiety and Palpitations   Lisinopril Cough   Levothyroxine Palpitations    Medication list has been reviewed and updated.  Current Outpatient  Medications on File Prior to Visit  Medication Sig Dispense Refill   losartan (COZAAR) 25 MG tablet Take 1 tablet (25 mg total) by mouth daily. 30 tablet 0   Melatonin Gummies 2.5 MG CHEW Chew 2.5 mg by mouth at bedtime.      Multiple Vitamins-Minerals (MULTIVITAMIN WITH MINERALS) tablet Take 1 tablet by mouth at bedtime. Reported on 07/24/2015     Rimegepant Sulfate (NURTEC) 75 MG TBDP Take 75 mg by mouth daily as needed (Maximum 1 tablet in 24 hours.). 8 tablet 11   rosuvastatin (CRESTOR) 20 MG tablet Take 1 tablet (20 mg total) by mouth daily. 90 tablet 1   thyroid (ARMOUR THYROID) 30 MG tablet TAKE 2 AND 1/2 TABLETS BY MOUTH EVERY DAY BEFORE BREAKFAST 225 tablet 1   No current facility-administered medications on file prior to visit.    Review of Systems:  As per HPI- otherwise negative.   Physical Examination: Vitals:   01/08/21 1406  BP: 130/80  Pulse: 87  Resp: 18  Temp: 97.8 F (36.6 C)  SpO2: 97%   Vitals:   01/08/21 1406  Weight: 283 lb (128.4 kg)  Height: 5\' 4"  (1.626 m)   Body mass index is 48.58 kg/m. Ideal Body Weight: Weight in (lb) to have BMI = 25: 145.3  GEN: no acute distress.  Obese, looks well HEENT: Atraumatic, Normocephalic.  Ears and Nose: No external deformity. CV: RRR, No M/G/R. No JVD. No thrill. No extra heart sounds. PULM: CTA B, no wheezes, crackles, rhonchi. No retractions. No resp. distress. No accessory muscle use. ABD: S, NT, ND. No rebound. No HSM. EXTR: No c/c/e PSYCH: Normally interactive. Conversant.  Normal pulses in both feet, no ulcerations or skin breakdown  Results for orders placed or performed in visit on 01/08/21  POCT glucose (manual entry)  Result Value Ref Range   POC Glucose 191 (A) 70 - 99 mg/dl     Assessment and Plan: Controlled type 2 diabetes mellitus without complication, without long-term current use of insulin (HCC) - Plan: Comprehensive metabolic panel, Hemoglobin A1c, metFORMIN (GLUCOPHAGE) 500 MG  tablet, Lipid panel, CBC, POCT glucose (manual entry)  Patient today with diagnosis of diabetes, previously prediabetic. Glucose is elevated but not out of control Discussed exercise and general dietary guidelines, need for annual retinal exam We will start her on metformin, 500 once a day.  Increase to twice daily after a week Will check A1c and other labs as above today, will be in touch with her pending these results Answered all questions, we will likely see her in  3 to 4 months for next A1c  This visit occurred during the SARS-CoV-2 public health emergency.  Safety protocols were in place, including screening questions prior to the visit, additional usage of staff PPE, and extensive cleaning of exam room while observing appropriate contact time as indicated for disinfecting solutions.   Signed Lamar Blinks, MD  Addendum 6/9, received her labs as below.  Message to patient  Results for orders placed or performed in visit on 01/08/21  Comprehensive metabolic panel  Result Value Ref Range   Sodium 139 135 - 145 mEq/L   Potassium 4.0 3.5 - 5.1 mEq/L   Chloride 101 96 - 112 mEq/L   CO2 28 19 - 32 mEq/L   Glucose, Bld 164 (H) 70 - 99 mg/dL   BUN 14 6 - 23 mg/dL   Creatinine, Ser 0.82 0.40 - 1.20 mg/dL   Total Bilirubin 0.6 0.2 - 1.2 mg/dL   Alkaline Phosphatase 85 39 - 117 U/L   AST 16 0 - 37 U/L   ALT 21 0 - 35 U/L   Total Protein 7.6 6.0 - 8.3 g/dL   Albumin 4.4 3.5 - 5.2 g/dL   GFR 83.34 >60.00 mL/min   Calcium 9.6 8.4 - 10.5 mg/dL  Hemoglobin A1c  Result Value Ref Range   Hgb A1c MFr Bld 10.5 (H) 4.6 - 6.5 %  Lipid panel  Result Value Ref Range   Cholesterol 170 0 - 200 mg/dL   Triglycerides 224.0 (H) 0.0 - 149.0 mg/dL   HDL 35.80 (L) >39.00 mg/dL   VLDL 44.8 (H) 0.0 - 40.0 mg/dL   Total CHOL/HDL Ratio 5    NonHDL 133.85   CBC  Result Value Ref Range   WBC 9.0 4.0 - 10.5 K/uL   RBC 4.42 3.87 - 5.11 Mil/uL   Platelets 320.0 150.0 - 400.0 K/uL   Hemoglobin 14.1  12.0 - 15.0 g/dL   HCT 40.8 36.0 - 46.0 %   MCV 92.2 78.0 - 100.0 fl   MCHC 34.5 30.0 - 36.0 g/dL   RDW 12.6 11.5 - 15.5 %  LDL cholesterol, direct  Result Value Ref Range   Direct LDL 118.0 mg/dL  POCT glucose (manual entry)  Result Value Ref Range   POC Glucose 191 (A) 70 - 99 mg/dl

## 2021-01-09 ENCOUNTER — Encounter: Payer: Self-pay | Admitting: Family Medicine

## 2021-01-09 DIAGNOSIS — E118 Type 2 diabetes mellitus with unspecified complications: Secondary | ICD-10-CM | POA: Insufficient documentation

## 2021-01-09 LAB — CBC
HCT: 40.8 % (ref 36.0–46.0)
Hemoglobin: 14.1 g/dL (ref 12.0–15.0)
MCHC: 34.5 g/dL (ref 30.0–36.0)
MCV: 92.2 fl (ref 78.0–100.0)
Platelets: 320 10*3/uL (ref 150.0–400.0)
RBC: 4.42 Mil/uL (ref 3.87–5.11)
RDW: 12.6 % (ref 11.5–15.5)
WBC: 9 10*3/uL (ref 4.0–10.5)

## 2021-01-09 LAB — COMPREHENSIVE METABOLIC PANEL
ALT: 21 U/L (ref 0–35)
AST: 16 U/L (ref 0–37)
Albumin: 4.4 g/dL (ref 3.5–5.2)
Alkaline Phosphatase: 85 U/L (ref 39–117)
BUN: 14 mg/dL (ref 6–23)
CO2: 28 mEq/L (ref 19–32)
Calcium: 9.6 mg/dL (ref 8.4–10.5)
Chloride: 101 mEq/L (ref 96–112)
Creatinine, Ser: 0.82 mg/dL (ref 0.40–1.20)
GFR: 83.34 mL/min (ref >60.00)
Glucose, Bld: 164 mg/dL — ABNORMAL HIGH (ref 70–99)
Potassium: 4 mEq/L (ref 3.5–5.1)
Sodium: 139 mEq/L (ref 135–145)
Total Bilirubin: 0.6 mg/dL (ref 0.2–1.2)
Total Protein: 7.6 g/dL (ref 6.0–8.3)

## 2021-01-09 LAB — LIPID PANEL
Cholesterol: 170 mg/dL (ref 0–200)
HDL: 35.8 mg/dL — ABNORMAL LOW (ref >39.00)
NonHDL: 133.85
Total CHOL/HDL Ratio: 5
Triglycerides: 224 mg/dL — ABNORMAL HIGH (ref 0.0–149.0)
VLDL: 44.8 mg/dL — ABNORMAL HIGH (ref 0.0–40.0)

## 2021-01-09 LAB — LDL CHOLESTEROL, DIRECT: Direct LDL: 118 mg/dL

## 2021-01-09 LAB — HEMOGLOBIN A1C: Hgb A1c MFr Bld: 10.5 % — ABNORMAL HIGH (ref 4.6–6.5)

## 2021-01-30 ENCOUNTER — Other Ambulatory Visit: Payer: Self-pay | Admitting: Family Medicine

## 2021-01-30 DIAGNOSIS — I10 Essential (primary) hypertension: Secondary | ICD-10-CM

## 2021-02-02 ENCOUNTER — Other Ambulatory Visit: Payer: Self-pay | Admitting: Family Medicine

## 2021-02-02 DIAGNOSIS — I10 Essential (primary) hypertension: Secondary | ICD-10-CM

## 2021-02-18 NOTE — Progress Notes (Deleted)
[place] at [facility] [address] [place], [place] [address] [phone]  Date:  02/24/2021   Name:  [name]   DOB:  02-23-1971   MRN:  [id number]  PCP:  Darreld Mclean, MD    Chief Complaint: No chief complaint on file.   History of Present Illness:  [name] is a 50 y.o. very pleasant female patient who presents with the following:  Pt seen today for a CPE- history of  hypothyroidism, obesity, benign ovarian tumor, suspicious colon polyp- diagnosed with DM last month  Last visit with myself in June to follow-up on elevated glucose sx - she had an A1c of over 10, changed previous dx of pre-diabetes We had her increase her previous dose of metformin with plans to titrate up to thousand twice daily  She is also on Crestor, Armour Thyroid, losartan 25  Lab Results  Component Value Date   HGBA1C 10.5 (H) 01/08/2021   Foot exam Eye exam Can do hepatitis C screening next routine labs Shingles vaccine Can get a pneumonia vaccine Colonoscopy-on chart I have a colonoscopy from 2020, [facility].  They did recommend a 1 year follow-up at that time Patient Active Problem List   Diagnosis Date Noted   Controlled diabetes mellitus type 2 with complications ([place]) 92/42/6834   RLQ abdominal pain 05/24/2019   History of benign ovarian tumor 05/24/2019   Personal history of colonic polyps    H/O colonoscopy with polypectomy    Acute lower GI bleeding 02/16/2018   Acute blood loss anemia 02/16/2018   Abnormal CT of the abdomen 01/12/2018   Colon cancer screening 01/12/2018   Obesity, unspecified 07/07/2013   Hypothyroidism 09/27/2011    Past Medical History:  Diagnosis Date   Anxiety    Benign ovarian tumor July 2012   17 pound benign tumor- removed   Depression    Hypertension    Personal history of colonic polyps    Sleep apnea    Thyroid disease     Past Surgical History:  Procedure Laterality Date    ABDOMINAL HYSTERECTOMY  2012   one ovary remains- due to giant benign ovarian cyst (17 lbs)   COLONOSCOPY WITH PROPOFOL N/A 02/16/2018   Procedure: COLONOSCOPY WITH PROPOFOL;  Surgeon: Jerene Bears, MD;  Location: [facility];  Service: Gastroenterology;  Laterality: N/A;   OVARIAN CYST REMOVAL      Social History   Tobacco Use   Smoking status: Never   Smokeless tobacco: Never  Vaping Use   Vaping Use: Never used  Substance Use Topics   Alcohol use: No    Alcohol/week: 0.0 standard drinks    Comment: socially   Drug use: No    Family History  Problem Relation Age of Onset   Heart disease Father    Stroke Father    Heart attack Father    Hyperlipidemia Father    Hypertension Father    Colon polyps Father        benign   Heart disease Paternal Grandfather    Heart attack Paternal Grandfather    COPD Mother    Congestive Heart Failure Mother    Lung cancer Mother        highly suspected, waiting on definitive bx 03/14/18   Gallbladder disease Maternal Grandmother    Stroke Paternal Grandmother    Colon cancer Neg Hx    Stomach cancer Neg Hx     Allergies  Allergen  Reactions   Levothyroxine Sodium Anxiety and Palpitations   Lisinopril Cough   Levothyroxine Palpitations    Medication list has been reviewed and updated.  Current Outpatient Medications on File Prior to Visit  Medication Sig Dispense Refill   losartan (COZAAR) 25 MG tablet TAKE 1 TABLET(25 MG) BY MOUTH DAILY 90 tablet 1   Melatonin Gummies 2.5 MG CHEW Chew 2.5 mg by mouth at bedtime.      metFORMIN (GLUCOPHAGE) 500 MG tablet Take 1 tablet (500 mg total) by mouth 2 (two) times daily with a meal. 180 tablet 3   Multiple Vitamins-Minerals (MULTIVITAMIN WITH MINERALS) tablet Take 1 tablet by mouth at bedtime. Reported on 07/24/2015     Rimegepant Sulfate (NURTEC) 75 MG TBDP Take 75 mg by mouth daily as needed (Maximum 1 tablet in 24 hours.). 8 tablet 11   rosuvastatin (CRESTOR) 20 MG tablet Take 1  tablet (20 mg total) by mouth daily. 90 tablet 1   thyroid (ARMOUR THYROID) 30 MG tablet TAKE 2 AND 1/2 TABLETS BY MOUTH EVERY DAY BEFORE BREAKFAST 225 tablet 1   No current facility-administered medications on file prior to visit.    Review of Systems:  As per HPI- otherwise negative.   Physical Examination: There were no vitals filed for this visit. There were no vitals filed for this visit. There is no height or weight on file to calculate BMI. Ideal Body Weight:    GEN: no acute distress. HEENT: Atraumatic, Normocephalic.  Ears and Nose: No external deformity. CV: RRR, No M/G/R. No JVD. No thrill. No extra heart sounds. PULM: CTA B, no wheezes, crackles, rhonchi. No retractions. No resp. distress. No accessory muscle use. ABD: S, NT, ND, +BS. No rebound. No HSM. EXTR: No c/c/e PSYCH: Normally interactive. Conversant.  Foot exam  Assessment and Plan: *** Physical exam today.  Encouraged healthy diet and exercise routine. This visit occurred during the SARS-CoV-2 public health emergency.  Safety protocols were in place, including screening questions prior to the visit, additional usage of staff PPE, and extensive cleaning of exam room while observing appropriate contact time as indicated for disinfecting solutions.   Signed Lamar Blinks, MD

## 2021-02-24 ENCOUNTER — Encounter: Payer: Managed Care, Other (non HMO) | Admitting: Family Medicine

## 2021-03-15 NOTE — Progress Notes (Deleted)
[place] at [facility] [address], [place], [place] [address] [phone]  Date:  03/19/2021   Name:  [name]   DOB:  [birth year]   MRN:  [id number]  PCP:  Darreld Mclean, MD    Chief Complaint: No chief complaint on file.   History of Present Illness:  [name] is a 50 y.o. very pleasant female patient who presents with the following:  Seen today for a CPE- history of prediabetes, hypothyroidism, obesity, benign ovarian tumor, suspicious colon polyp Last visit in June; at that time she was newly dx with DM with dramatic increase of her A1c and sx of hyperglycemia   We started her on metformin- will check progress today   Lab Results  Component Value Date   HGBA1C 10.5 (H) 01/08/2021   Needs foot exam Eye exam Hep C screening Shingrix Needs prevnar  Colon cancer screen Covid booster   Patient Active Problem List   Diagnosis Date Noted   Controlled diabetes mellitus type 2 with complications ([place]) 123XX123   RLQ abdominal pain 05/24/2019   History of benign ovarian tumor 05/24/2019   Personal history of colonic polyps    H/O colonoscopy with polypectomy    Acute lower GI bleeding 02/16/2018   Acute blood loss anemia 02/16/2018   Abnormal CT of the abdomen 01/12/2018   Colon cancer screening 01/12/2018   Obesity, unspecified 07/07/2013   Hypothyroidism 09/27/2011    Past Medical History:  Diagnosis Date   Anxiety    Benign ovarian tumor July 2012   17 pound benign tumor- removed   Depression    Hypertension    Personal history of colonic polyps    Sleep apnea    Thyroid disease     Past Surgical History:  Procedure Laterality Date   ABDOMINAL HYSTERECTOMY  2012   one ovary remains- due to giant benign ovarian cyst (17 lbs)   COLONOSCOPY WITH PROPOFOL N/A 02/16/2018   Procedure: COLONOSCOPY WITH PROPOFOL;  Surgeon: Jerene Bears, MD;  Location: [facility];  Service:  Gastroenterology;  Laterality: N/A;   OVARIAN CYST REMOVAL      Social History   Tobacco Use   Smoking status: Never   Smokeless tobacco: Never  Vaping Use   Vaping Use: Never used  Substance Use Topics   Alcohol use: No    Alcohol/week: 0.0 standard drinks    Comment: socially   Drug use: No    Family History  Problem Relation Age of Onset   Heart disease Father    Stroke Father    Heart attack Father    Hyperlipidemia Father    Hypertension Father    Colon polyps Father        benign   Heart disease Paternal Grandfather    Heart attack Paternal Grandfather    COPD Mother    Congestive Heart Failure Mother    Lung cancer Mother        highly suspected, waiting on definitive bx 03/14/18   Gallbladder disease Maternal Grandmother    Stroke Paternal Grandmother    Colon cancer Neg Hx    Stomach cancer Neg Hx     Allergies  Allergen Reactions   Levothyroxine Sodium Anxiety and Palpitations   Lisinopril Cough   Levothyroxine Palpitations    Medication list has been reviewed and updated.  Current Outpatient Medications on File Prior to Visit  Medication Sig Dispense Refill   losartan (COZAAR) 25  MG tablet TAKE 1 TABLET(25 MG) BY MOUTH DAILY 90 tablet 1   Melatonin Gummies 2.5 MG CHEW Chew 2.5 mg by mouth at bedtime.      metFORMIN (GLUCOPHAGE) 500 MG tablet Take 1 tablet (500 mg total) by mouth 2 (two) times daily with a meal. 180 tablet 3   Multiple Vitamins-Minerals (MULTIVITAMIN WITH MINERALS) tablet Take 1 tablet by mouth at bedtime. Reported on 07/24/2015     Rimegepant Sulfate (NURTEC) 75 MG TBDP Take 75 mg by mouth daily as needed (Maximum 1 tablet in 24 hours.). 8 tablet 11   rosuvastatin (CRESTOR) 20 MG tablet Take 1 tablet (20 mg total) by mouth daily. 90 tablet 1   thyroid (ARMOUR THYROID) 30 MG tablet TAKE 2 AND 1/2 TABLETS BY MOUTH EVERY DAY BEFORE BREAKFAST 225 tablet 1   No current facility-administered medications on file prior to visit.     Review of Systems:  As per HPI- otherwise negative.   Physical Examination: There were no vitals filed for this visit. There were no vitals filed for this visit. There is no height or weight on file to calculate BMI. Ideal Body Weight:    GEN: no acute distress. HEENT: Atraumatic, Normocephalic.  Ears and Nose: No external deformity. CV: RRR, No M/G/R. No JVD. No thrill. No extra heart sounds. PULM: CTA B, no wheezes, crackles, rhonchi. No retractions. No resp. distress. No accessory muscle use. ABD: S, NT, ND, +BS. No rebound. No HSM. EXTR: No c/c/e PSYCH: Normally interactive. Conversant.  Foot exam  Assessment and Plan: ***  Physical exam today- encouraged healthy diet and exercise routine   This visit occurred during the SARS-CoV-2 public health emergency.  Safety protocols were in place, including screening questions prior to the visit, additional usage of staff PPE, and extensive cleaning of exam room while observing appropriate contact time as indicated for disinfecting solutions.   Signed Lamar Blinks, MD

## 2021-03-19 ENCOUNTER — Encounter: Payer: Self-pay | Admitting: Family Medicine

## 2021-03-19 DIAGNOSIS — E039 Hypothyroidism, unspecified: Secondary | ICD-10-CM

## 2021-03-19 DIAGNOSIS — E119 Type 2 diabetes mellitus without complications: Secondary | ICD-10-CM

## 2021-03-19 DIAGNOSIS — Z0000 Encounter for general adult medical examination without abnormal findings: Secondary | ICD-10-CM

## 2021-03-19 DIAGNOSIS — Z1159 Encounter for screening for other viral diseases: Secondary | ICD-10-CM

## 2021-03-25 ENCOUNTER — Other Ambulatory Visit: Payer: Self-pay | Admitting: Family Medicine

## 2021-03-25 DIAGNOSIS — E119 Type 2 diabetes mellitus without complications: Secondary | ICD-10-CM

## 2021-03-25 MED ORDER — METFORMIN HCL 500 MG PO TABS: 500.0000 mg | ORAL_TABLET | Freq: Two times a day (BID) | ORAL | 3 refills | Status: DC

## 2021-03-25 NOTE — Telephone Encounter (Signed)
Pt called in stating that she is able to tolerate the medication that was prescribed, she was told to inform [facility] if she could tolerate it. She would like refills on the medication below: metFORMIN (GLUCOPHAGE) 500 MG tablet   [id number] - [place], [address]  [place], [place] [address]  Phone:  [phone]

## 2021-03-25 NOTE — Telephone Encounter (Signed)
I have pended medication. If ok please advise on refill.

## 2021-04-14 ENCOUNTER — Encounter: Payer: Self-pay | Admitting: Family Medicine

## 2021-04-18 ENCOUNTER — Other Ambulatory Visit: Payer: Self-pay | Admitting: Family Medicine

## 2021-04-18 DIAGNOSIS — E039 Hypothyroidism, unspecified: Secondary | ICD-10-CM

## 2021-04-22 NOTE — Progress Notes (Addendum)
[place] at [facility] [address], [place], [place] [address] [phone]  Date:  04/24/2021   Name:  [name]   DOB:  07/18/1971   MRN:  [id number]  PCP:  Darreld Mclean, MD    Chief Complaint: Annual Exam (Concerns/ questions: pt says at her last OV she was dx with DM and she would like to f/u on this. She inquires about Shingrix./Flu shot today: scheduled for next week./Foot exam due/Hep C screen due/Sees gyn)   History of Present Illness:  [name] is a 50 y.o. very pleasant female patient who presents with the following:  Patient here today for a physical exam.  Last visit with myself was in June History of diabetes (recently upgraded from prediabetes), hypothyroidism, obesity, benign ovarian tumor, suspicion is colon polyp In June she presented with symptoms of high blood sugar-previously her diagnosis was prediabetes.  A1c in June as below Lab Results  Component Value Date   HGBA1C 10.5 (H) 01/08/2021   We had her start on metformin, continue Crestor Her glucose is running 150- 220 generally  She lost some weight but gained it back; when she was lighter she did see improvement in her glucose  She is on metformin 500 BID; she has mild GI symptoms  Foot exam- will do today  Eye exam- she went 2 months ago  Hepatitis C screening- will do today  Shingrix vaccine- she would  Due for colonoscopy due to polyp seen in 2020- scheduled for 2/23 Flu shot- will be done soon  COVID booster- she plans to do this  Mammogram done in May  Losartan - 25 mg  Metformin-  Current dose 500 BID Nurtec as needed Crestor Thyroid 75 mg daily  No contra to GLP-1; she would like to try adding this class of medication.  We will start her on trulicity today   She is seeing neurology for her HA but they are much better recently- rarely uses her Nurtec Their son is 40 and seeing peds currently.  Adopted by Luiz Ochoa and her  wife at age 52, he does struggle with PTSD   Patient Active Problem List   Diagnosis Date Noted   Controlled diabetes mellitus type 2 with complications ([place]) 84/53/6468   History of benign ovarian tumor 05/24/2019   Personal history of colonic polyps    H/O colonoscopy with polypectomy    Acute lower GI bleeding 02/16/2018   Acute blood loss anemia 02/16/2018   Obesity, unspecified 07/07/2013   Hypothyroidism 09/27/2011    Past Medical History:  Diagnosis Date   Anxiety    Benign ovarian tumor July 2012   17 pound benign tumor- removed   Depression    Hypertension    Personal history of colonic polyps    Sleep apnea    Thyroid disease     Past Surgical History:  Procedure Laterality Date   ABDOMINAL HYSTERECTOMY  2012   one ovary remains- due to giant benign ovarian cyst (17 lbs)   COLONOSCOPY WITH PROPOFOL N/A 02/16/2018   Procedure: COLONOSCOPY WITH PROPOFOL;  Surgeon: Jerene Bears, MD;  Location: [facility];  Service: Gastroenterology;  Laterality: N/A;   OVARIAN CYST REMOVAL      Social History   Tobacco Use   Smoking status: Never   Smokeless tobacco: Never  Vaping Use   Vaping Use: Never used  Substance Use Topics   Alcohol use: No    Alcohol/week: 0.0  standard drinks    Comment: socially   Drug use: No    Family History  Problem Relation Age of Onset   Heart disease Father    Stroke Father    Heart attack Father    Hyperlipidemia Father    Hypertension Father    Colon polyps Father        benign   Heart disease Paternal Grandfather    Heart attack Paternal Grandfather    COPD Mother    Congestive Heart Failure Mother    Lung cancer Mother        highly suspected, waiting on definitive bx 03/14/18   Gallbladder disease Maternal Grandmother    Stroke Paternal Grandmother    Colon cancer Neg Hx    Stomach cancer Neg Hx     Allergies  Allergen Reactions   Levothyroxine Sodium Anxiety and Palpitations   Lisinopril Cough   Levothyroxine  Palpitations    Medication list has been reviewed and updated.  Current Outpatient Medications on File Prior to Visit  Medication Sig Dispense Refill   losartan (COZAAR) 25 MG tablet TAKE 1 TABLET(25 MG) BY MOUTH DAILY 90 tablet 1   Melatonin Gummies 2.5 MG CHEW Chew 2.5 mg by mouth at bedtime.      metFORMIN (GLUCOPHAGE) 500 MG tablet Take 1 tablet (500 mg total) by mouth 2 (two) times daily with a meal. 180 tablet 3   Multiple Vitamins-Minerals (MULTIVITAMIN WITH MINERALS) tablet Take 1 tablet by mouth at bedtime. Reported on 07/24/2015     Rimegepant Sulfate (NURTEC) 75 MG TBDP Take 75 mg by mouth daily as needed (Maximum 1 tablet in 24 hours.). 8 tablet 11   rosuvastatin (CRESTOR) 20 MG tablet Take 1 tablet (20 mg total) by mouth daily. 90 tablet 1   thyroid (NP THYROID) 30 MG tablet TAKE 2 AND 1/2 TABLETS BY MOUTH EVERY DAY BEFORE BREAKFAST 225 tablet 1   No current facility-administered medications on file prior to visit.    Review of Systems:  As per HPI- otherwise negative.   Physical Examination: Vitals:   04/24/21 0841  BP: 122/80  Pulse: 76  Resp: 18  Temp: 98.6 F (37 C)  SpO2: 98%   Vitals:   04/24/21 0841  Weight: 281 lb 9.6 oz (127.7 kg)  Height: 5\' 4"  (1.626 m)   Body mass index is 48.34 kg/m. Ideal Body Weight: Weight in (lb) to have BMI = 25: 145.3  GEN: no acute distress. Obese, looks well  HEENT: Atraumatic, Normocephalic.  Ears and Nose: No external deformity. CV: RRR, No M/G/R. No JVD. No thrill. No extra heart sounds. PULM: CTA B, no wheezes, crackles, rhonchi. No retractions. No resp. distress. No accessory muscle use. ABD: S, NT, ND, +BS. No rebound. No HSM. EXTR: No c/c/e PSYCH: Normally interactive. Conversant.   Provided with 2 Trulicity sample pens and did her first dose today- 0.75 Assessment and Plan: Physical exam  Controlled type 2 diabetes mellitus with complication, without long-term current use of insulin ([place]) - Plan:  Comprehensive metabolic panel, Hemoglobin A1c, Ambulatory referral to Family Practice, Dulaglutide (TRULICITY) 6.76 PP/5.0DT SOPN  Acquired hypothyroidism - Plan: TSH  Essential hypertension - Plan: CBC, Comprehensive metabolic panel  Dyslipidemia  Fatigue, unspecified type - Plan: TSH, VITAMIN D 25 Hydroxy (Vit-D Deficiency, Fractures)  Encounter for hepatitis C screening test for low risk patient - Plan: Hepatitis C antibody  Immunization due - Plan: Varicella-zoster vaccine IM (Shingrix) CPE- encouraged healthy diet an exercise routine Gave first dose  of Shingrix, will plan for second dose in 4 months Referral made to healthy weight and wellness center Starting Trulicity for diabetes control Will plan further follow- up pending labs.   This visit occurred during the SARS-CoV-2 public health emergency.  Safety protocols were in place, including screening questions prior to the visit, additional usage of staff PPE, and extensive cleaning of exam room while observing appropriate contact time as indicated for disinfecting solutions.   Signed Lamar Blinks, MD  Received labs as below, message to patient  Results for orders placed or performed in visit on 04/24/21  CBC  Result Value Ref Range   WBC 8.0 4.0 - 10.5 K/uL   RBC 4.33 3.87 - 5.11 Mil/uL   Platelets 316.0 150.0 - 400.0 K/uL   Hemoglobin 13.5 12.0 - 15.0 g/dL   HCT 39.9 36.0 - 46.0 %   MCV 92.0 78.0 - 100.0 fl   MCHC 33.9 30.0 - 36.0 g/dL   RDW 12.6 11.5 - 15.5 %  Comprehensive metabolic panel  Result Value Ref Range   Sodium 137 135 - 145 mEq/L   Potassium 4.3 3.5 - 5.1 mEq/L   Chloride 101 96 - 112 mEq/L   CO2 27 19 - 32 mEq/L   Glucose, Bld 167 (H) 70 - 99 mg/dL   BUN 15 6 - 23 mg/dL   Creatinine, Ser 0.68 0.40 - 1.20 mg/dL   Total Bilirubin 0.6 0.2 - 1.2 mg/dL   Alkaline Phosphatase 84 39 - 117 U/L   AST 14 0 - 37 U/L   ALT 20 0 - 35 U/L   Total Protein 7.4 6.0 - 8.3 g/dL   Albumin 4.3 3.5 - 5.2 g/dL    GFR 101.27 >60.00 mL/min   Calcium 9.3 8.4 - 10.5 mg/dL  Hemoglobin A1c  Result Value Ref Range   Hgb A1c MFr Bld 9.0 (H) 4.6 - 6.5 %  TSH  Result Value Ref Range   TSH 14.23 (H) 0.35 - 5.50 uIU/mL  VITAMIN D 25 Hydroxy (Vit-D Deficiency, Fractures)  Result Value Ref Range   VITD 18.31 (L) 30.00 - 100.00 ng/mL

## 2021-04-22 NOTE — Patient Instructions (Addendum)
Good to see you again today!   We will start you on Trulicity for your diabetes- use once a week, continue metformin as well  1st shingrix given today- can do 2nd dose in 2-6 months  Referral made to Dr Migdalia Dk office for weight management   Plan for covid bivalent booster and flu shot this fall

## 2021-04-24 ENCOUNTER — Other Ambulatory Visit: Payer: Self-pay

## 2021-04-24 ENCOUNTER — Ambulatory Visit (INDEPENDENT_AMBULATORY_CARE_PROVIDER_SITE_OTHER): Payer: BC Managed Care – PPO | Admitting: Family Medicine

## 2021-04-24 ENCOUNTER — Encounter: Payer: Self-pay | Admitting: Family Medicine

## 2021-04-24 VITALS — BP 122/80 | HR 76 | Temp 98.6°F | Resp 18 | Ht 64.0 in | Wt 281.6 lb

## 2021-04-24 DIAGNOSIS — E559 Vitamin D deficiency, unspecified: Secondary | ICD-10-CM

## 2021-04-24 DIAGNOSIS — E039 Hypothyroidism, unspecified: Secondary | ICD-10-CM

## 2021-04-24 DIAGNOSIS — Z23 Encounter for immunization: Secondary | ICD-10-CM

## 2021-04-24 DIAGNOSIS — E118 Type 2 diabetes mellitus with unspecified complications: Secondary | ICD-10-CM

## 2021-04-24 DIAGNOSIS — E785 Hyperlipidemia, unspecified: Secondary | ICD-10-CM

## 2021-04-24 DIAGNOSIS — R5383 Other fatigue: Secondary | ICD-10-CM | POA: Diagnosis not present

## 2021-04-24 DIAGNOSIS — Z1159 Encounter for screening for other viral diseases: Secondary | ICD-10-CM | POA: Diagnosis not present

## 2021-04-24 DIAGNOSIS — Z0000 Encounter for general adult medical examination without abnormal findings: Secondary | ICD-10-CM | POA: Diagnosis not present

## 2021-04-24 DIAGNOSIS — I10 Essential (primary) hypertension: Secondary | ICD-10-CM

## 2021-04-24 LAB — COMPREHENSIVE METABOLIC PANEL
ALT: 20 U/L (ref 0–35)
AST: 14 U/L (ref 0–37)
Albumin: 4.3 g/dL (ref 3.5–5.2)
Alkaline Phosphatase: 84 U/L (ref 39–117)
BUN: 15 mg/dL (ref 6–23)
CO2: 27 mEq/L (ref 19–32)
Calcium: 9.3 mg/dL (ref 8.4–10.5)
Chloride: 101 mEq/L (ref 96–112)
Creatinine, Ser: 0.68 mg/dL (ref 0.40–1.20)
GFR: 101.27 mL/min (ref >60.00)
Glucose, Bld: 167 mg/dL — ABNORMAL HIGH (ref 70–99)
Potassium: 4.3 mEq/L (ref 3.5–5.1)
Sodium: 137 mEq/L (ref 135–145)
Total Bilirubin: 0.6 mg/dL (ref 0.2–1.2)
Total Protein: 7.4 g/dL (ref 6.0–8.3)

## 2021-04-24 LAB — CBC
HCT: 39.9 % (ref 36.0–46.0)
Hemoglobin: 13.5 g/dL (ref 12.0–15.0)
MCHC: 33.9 g/dL (ref 30.0–36.0)
MCV: 92 fl (ref 78.0–100.0)
Platelets: 316 10*3/uL (ref 150.0–400.0)
RBC: 4.33 Mil/uL (ref 3.87–5.11)
RDW: 12.6 % (ref 11.5–15.5)
WBC: 8 10*3/uL (ref 4.0–10.5)

## 2021-04-24 LAB — VITAMIN D 25 HYDROXY (VIT D DEFICIENCY, FRACTURES): VITD: 18.31 ng/mL — ABNORMAL LOW (ref 30.00–100.00)

## 2021-04-24 LAB — HEMOGLOBIN A1C: Hgb A1c MFr Bld: 9 % — ABNORMAL HIGH (ref 4.6–6.5)

## 2021-04-24 LAB — TSH: TSH: 14.23 u[IU]/mL — ABNORMAL HIGH (ref 0.35–5.50)

## 2021-04-24 MED ORDER — TRULICITY 0.75 MG/0.5ML ~~LOC~~ SOAJ: 0.7500 mg | SUBCUTANEOUS | 3 refills | Status: DC

## 2021-04-24 MED ORDER — VITAMIN D3 1.25 MG (50000 UT) PO CAPS: ORAL_CAPSULE | ORAL | 0 refills | Status: DC

## 2021-04-24 NOTE — Addendum Note (Signed)
Addended by: Darreld Mclean on: 04/24/2021 08:28 PM   Modules accepted: Orders

## 2021-04-25 LAB — HEPATITIS C ANTIBODY
Hepatitis C Ab: NONREACTIVE
SIGNAL TO CUT-OFF: 0.01 (ref <1.00)

## 2021-04-29 ENCOUNTER — Telehealth: Payer: Self-pay | Admitting: Internal Medicine
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
## 2021-04-29 NOTE — Telephone Encounter (Signed)
Spoke with pt. She report for the past 3 days she's been experiencing pain in her left breast area and around under her left scapula. She report it feels like constant tightness with occasional sharp pain. She state she used topical bio freeze and tums with no relief.   Pt also report she feels like her blood is bubbling in her left calf. Pt denies swelling, discoloration, pain, or warmth in that area.  Based on current symptoms, nurse recommended pt report to ER for further evaluations.

## 2021-04-29 NOTE — Telephone Encounter (Signed)
pain underneath left scalpula and breast, taken tums and bio freeze not helping, tight around left shoulder, right side of leg down in her calf is feeling a bubbling type of pain.

## 2021-05-01 ENCOUNTER — Telehealth (INDEPENDENT_AMBULATORY_CARE_PROVIDER_SITE_OTHER): Payer: BC Managed Care – PPO | Admitting: Neurology

## 2021-05-01 ENCOUNTER — Encounter: Payer: Self-pay | Admitting: Neurology

## 2021-05-01 ENCOUNTER — Other Ambulatory Visit: Payer: Self-pay

## 2021-05-01 DIAGNOSIS — G43109 Migraine with aura, not intractable, without status migrainosus: Secondary | ICD-10-CM

## 2021-05-01 MED ORDER — NURTEC 75 MG PO TBDP: 75.0000 mg | ORAL_TABLET | Freq: Every day | ORAL | 5 refills | Status: AC | PRN

## 2021-05-06 DIAGNOSIS — Z01419 Encounter for gynecological examination (general) (routine) without abnormal findings: Secondary | ICD-10-CM | POA: Diagnosis not present

## 2021-05-06 DIAGNOSIS — Z6841 Body Mass Index (BMI) 40.0 and over, adult: Secondary | ICD-10-CM | POA: Diagnosis not present

## 2021-05-13 ENCOUNTER — Ambulatory Visit: Payer: BC Managed Care – PPO | Admitting: Internal Medicine

## 2021-05-13 ENCOUNTER — Encounter: Payer: Self-pay | Admitting: Internal Medicine

## 2021-05-13 ENCOUNTER — Other Ambulatory Visit: Payer: Self-pay

## 2021-05-13 VITALS — BP 130/82 | HR 84 | Ht 64.0 in | Wt 282.4 lb

## 2021-05-13 DIAGNOSIS — R0789 Other chest pain: Secondary | ICD-10-CM

## 2021-05-13 DIAGNOSIS — Z6841 Body Mass Index (BMI) 40.0 and over, adult: Secondary | ICD-10-CM

## 2021-05-13 DIAGNOSIS — I2510 Atherosclerotic heart disease of native coronary artery without angina pectoris: Secondary | ICD-10-CM

## 2021-05-13 DIAGNOSIS — M79604 Pain in right leg: Secondary | ICD-10-CM | POA: Diagnosis not present

## 2021-05-13 DIAGNOSIS — I10 Essential (primary) hypertension: Secondary | ICD-10-CM

## 2021-05-13 DIAGNOSIS — E782 Mixed hyperlipidemia: Secondary | ICD-10-CM | POA: Diagnosis not present

## 2021-05-13 DIAGNOSIS — I2584 Coronary atherosclerosis due to calcified coronary lesion: Secondary | ICD-10-CM

## 2021-05-13 NOTE — Progress Notes (Signed)
Cardiology Office Note:    Date:  05/13/2021   ID:  [name], DOB May 09, 1971, MRN [id number]  PCP:  Sierra Mclean, MD  Cardiologist:  Elouise Munroe, MD  Electrophysiologist:  None   Referring MD: Sierra Mclean, MD   Chief Complaint/Reason for Referral: Follow up  History of Present Illness:    [name] is a 50 y.o. female with a history of benign ovarian tumor status post hysterectomy and oophorectomy, colon polyp status post excision, and weight gain who presents today for follow up of chest pain, HTN, and HLD.     Overall doing well, however has some concerns. Notes right leg discomfort for the last several days. Also notes left breast area pain. Unsure if this is skin/MSK or cardiac. We reviewed prior cardiac testing including echocardiogram and CCTA with minimal CAD. Echo showed normal EF and no signs of pulmonary HTN with normal RVSP.  The patient denies dyspnea at rest or with exertion, palpitations, PND, orthopnea, or leg swelling. Denies cough, fever, chills. Denies nausea, vomiting. Denies syncope or presyncope. Denies dizziness or lightheadedness.   Past Medical History:  Diagnosis Date   Anxiety    Benign ovarian tumor 02/2011   17 pound benign tumor- removed   Depression    Diabetes 1.5, managed as type 2 ([place])    Hypertension    Personal history of colonic polyps    Sleep apnea    Thyroid disease    Vitamin D deficiency     Past Surgical History:  Procedure Laterality Date   ABDOMINAL HYSTERECTOMY  2012   one ovary remains- due to giant benign ovarian cyst (17 lbs)   COLONOSCOPY WITH PROPOFOL N/A 02/16/2018   Procedure: COLONOSCOPY WITH PROPOFOL;  Surgeon: Jerene Bears, MD;  Location: [facility];  Service: Gastroenterology;  Laterality: N/A;   OVARIAN CYST REMOVAL      Current Medications: Current Meds  Medication Sig   Cholecalciferol (VITAMIN D3) 1.25 MG (50000 UT) CAPS Take 1 weekly for 12 weeks   Dulaglutide (TRULICITY)  3.49 ZP/9.1TA SOPN Inject 0.75 mg into the skin once a week.   losartan (COZAAR) 25 MG tablet TAKE 1 TABLET(25 MG) BY MOUTH DAILY   Melatonin Gummies 2.5 MG CHEW Chew 2.5 mg by mouth at bedtime.    metFORMIN (GLUCOPHAGE) 500 MG tablet Take 1 tablet (500 mg total) by mouth 2 (two) times daily with a meal.   Multiple Vitamins-Minerals (MULTIVITAMIN WITH MINERALS) tablet Take 1 tablet by mouth at bedtime. Reported on 07/24/2015   Rimegepant Sulfate (NURTEC) 75 MG TBDP Take 75 mg by mouth daily as needed.   rosuvastatin (CRESTOR) 20 MG tablet Take 1 tablet (20 mg total) by mouth daily.   thyroid (NP THYROID) 30 MG tablet TAKE 2 AND 1/2 TABLETS BY MOUTH EVERY DAY BEFORE BREAKFAST     Allergies:   Levothyroxine sodium, Lisinopril, and Levothyroxine   Social History   Tobacco Use   Smoking status: Never   Smokeless tobacco: Never  Vaping Use   Vaping Use: Never used  Substance Use Topics   Alcohol use: No    Alcohol/week: 0.0 standard drinks    Comment: socially   Drug use: No     Family History: The patient's family history includes COPD in her mother; Colon polyps in her father; Congestive Heart Failure in her mother; Gallbladder disease in her maternal grandmother; Heart attack in her father and paternal grandfather; Heart disease in her father and paternal grandfather; Hyperlipidemia in her  father; Hypertension in her father; Lung cancer in her mother; Stroke in her father and paternal grandmother. There is no history of Colon cancer or Stomach cancer.  ROS:   Please see the history of present illness.    All other systems reviewed and are negative.  EKGs/Labs/Other Studies Reviewed:    The following studies were reviewed today:  EKG:  NSR, Minimal LVH criteria.  Imaging studies that I have independently reviewed today: n/a  Recent Labs: 04/24/2021: ALT 20; BUN 15; Creatinine, Ser 0.68; Hemoglobin 13.5; Platelets 316.0; Potassium 4.3; Sodium 137; TSH 14.23  Recent Lipid Panel     Component Value Date/Time   CHOL 170 01/08/2021 1438   CHOL 153 02/06/2020 0947   TRIG 224.0 (H) 01/08/2021 1438   HDL 35.80 (L) 01/08/2021 1438   HDL 36 (L) 02/06/2020 0947   CHOLHDL 5 01/08/2021 1438   VLDL 44.8 (H) 01/08/2021 1438   LDLCALC 88 02/06/2020 0947   LDLDIRECT 118.0 01/08/2021 1438    Physical Exam:    VS:  BP 130/82   Pulse 84   Ht 5\' 4"  (1.626 m)   Wt 282 lb 6.4 oz (128.1 kg)   SpO2 95%   BMI 48.47 kg/m     Wt Readings from Last 5 Encounters:  05/13/21 282 lb 6.4 oz (128.1 kg)  04/24/21 281 lb 9.6 oz (127.7 kg)  01/08/21 283 lb (128.4 kg)  08/30/20 278 lb (126.1 kg)  05/01/20 297 lb (134.7 kg)    Constitutional: No acute distress Eyes: sclera non-icteric, normal conjunctiva and lids ENMT: normal dentition, moist mucous membranes Cardiovascular: regular rhythm, normal rate, no murmur. S1 and S2 normal. No jugular venous distention.  Respiratory: clear to auscultation bilaterally GI : normal bowel sounds, soft and nontender. No distention.   MSK: extremities warm, well perfused. No edema.  NEURO: grossly nonfocal exam, moves all extremities. PSYCH: alert and oriented x 3, normal mood and affect.   ASSESSMENT:    1. Pain of right lower extremity   2. Essential hypertension   3. Mixed hyperlipidemia   4. Atypical chest pain   5. Coronary artery calcification   6. Class 3 severe obesity without serious comorbidity with body mass index (BMI) of 45.0 to 49.9 in adult, unspecified obesity type (HCC)    PLAN:    Pain of right lower extremity - Plan: VAS [place] LOWER EXTREMITY VENOUS (DVT) - will obtain lower extremity doppler to ensure no DVT.   Essential hypertension - Plan: EKG 12-Lead - BP overall well controlled with losartan 25 mg daily. Can uptitrate if needed.   Mixed hyperlipidemia - continue crestor 20 mg daily. Follows lipids with PCP.   Atypical chest pain - she will monitor this and follow up with PCP. Unlikely to represent cardiac chest  pain with normal studies two years ago.   Coronary artery calcification - continue crestor 20 mg daily.   Class 3 severe obesity without serious comorbidity with body mass index (BMI) of 45.0 to 49.9 in adult, unspecified obesity type ([place]) - consider healthy weight and wellness referral if interested.   Total time of encounter: 30 minutes total time of encounter, including 20 minutes spent in face-to-face patient care on the date of this encounter. This time includes coordination of care and counseling regarding above mentioned problem list. Remainder of non-face-to-face time involved reviewing chart documents/testing relevant to the patient encounter and documentation in the medical record. I have independently reviewed documentation from referring provider.   Cherlynn Kaiser, MD, [facility]  [place]  [facility]   Shared Decision Making/Informed Consent:       Medication Adjustments/Labs and Tests Ordered: Current medicines are reviewed at length with the patient today.  Concerns regarding medicines are outlined above.   Orders Placed This Encounter  Procedures   EKG 12-Lead   VAS [place] LOWER EXTREMITY VENOUS (DVT)    No orders of the defined types were placed in this encounter.   Patient Instructions  Medication Instructions:  No Changes In Medications at this time.  *If you need a refill on your cardiac medications before your next appointment, please call your pharmacy*  Testing/Procedures: Your physician has requested that you have a lower venous duplex. This test is an ultrasound of the veins in the legs. It looks at venous blood flow that carries blood from the heart to the legs. Allow one hour for a Lower Venous exam. There are no restrictions or special instructions.  Follow-Up: At [facility], you and your health needs are our priority.  As part of our continuing mission to provide you with exceptional heart care, we have created designated Provider Care Teams.   These Care Teams include your primary Cardiologist (physician) and Advanced Practice Providers (APPs -  Physician Assistants and Nurse Practitioners) who all work together to provide you with the care you need, when you need it.  Your next appointment:   6 month(s)  The format for your next appointment:   In Person  Provider:   Cherlynn Kaiser, MD

## 2021-05-13 NOTE — Patient Instructions (Signed)
Medication Instructions:  No Changes In Medications at this time.  *If you need a refill on your cardiac medications before your next appointment, please call your pharmacy*  Testing/Procedures: Your physician has requested that you have a lower venous duplex. This test is an ultrasound of the veins in the legs. It looks at venous blood flow that carries blood from the heart to the legs. Allow one hour for a Lower Venous exam. There are no restrictions or special instructions.  Follow-Up: At [facility], you and your health needs are our priority.  As part of our continuing mission to provide you with exceptional heart care, we have created designated Provider Care Teams.  These Care Teams include your primary Cardiologist (physician) and Advanced Practice Providers (APPs -  Physician Assistants and Nurse Practitioners) who all work together to provide you with the care you need, when you need it.  Your next appointment:   6 month(s)  The format for your next appointment:   In Person  Provider:   Cherlynn Kaiser, MD

## 2021-05-21 ENCOUNTER — Other Ambulatory Visit: Payer: Self-pay

## 2021-05-21 ENCOUNTER — Ambulatory Visit (HOSPITAL_COMMUNITY)
Admission: RE | Admit: 2021-05-21 | Discharge: 2021-05-21 | Disposition: A | Discharge status: Home / Self Care | Payer: BC Managed Care – PPO | Source: Ambulatory Visit | Attending: Cardiology | Admitting: Cardiology

## 2021-05-21 DIAGNOSIS — M79604 Pain in right leg: Secondary | ICD-10-CM | POA: Diagnosis not present

## 2021-07-02 ENCOUNTER — Telehealth: Payer: Self-pay

## 2021-07-02 NOTE — Telephone Encounter (Signed)
New message   Prior Authorization submitted to [facility]    Medication Nurtec

## 2021-07-14 ENCOUNTER — Other Ambulatory Visit: Payer: Self-pay | Admitting: Internal Medicine

## 2021-07-14 DIAGNOSIS — E782 Mixed hyperlipidemia: Secondary | ICD-10-CM

## 2021-07-16 ENCOUNTER — Telehealth: Payer: Self-pay | Admitting: Gastroenterology

## 2021-07-16 NOTE — Telephone Encounter (Signed)
Patient called states she is having the same issues and pain that she had before and she seeking advise.

## 2021-07-17 ENCOUNTER — Other Ambulatory Visit: Payer: Self-pay

## 2021-07-17 NOTE — Telephone Encounter (Signed)
The pt was last seen in 2020 by Alonza Bogus Pa.  The pt states she is having the same issues she was having 2 years ago.  I advised by message that she would need to call in to make an office visit for follow up since she has not been seen recently.

## 2021-08-15 ENCOUNTER — Ambulatory Visit: Payer: BC Managed Care – PPO | Admitting: Physician Assistant

## 2021-08-15 ENCOUNTER — Encounter: Payer: Self-pay | Admitting: Physician Assistant

## 2021-08-15 ENCOUNTER — Other Ambulatory Visit: Payer: BC Managed Care – PPO

## 2021-08-15 VITALS — BP 128/80 | HR 82 | Ht 64.0 in | Wt 288.2 lb

## 2021-08-15 DIAGNOSIS — Z86010 Personal history of colonic polyps: Secondary | ICD-10-CM

## 2021-08-15 DIAGNOSIS — Z86018 Personal history of other benign neoplasm: Secondary | ICD-10-CM | POA: Diagnosis not present

## 2021-08-15 DIAGNOSIS — R1031 Right lower quadrant pain: Secondary | ICD-10-CM

## 2021-08-15 DIAGNOSIS — R197 Diarrhea, unspecified: Secondary | ICD-10-CM

## 2021-08-15 DIAGNOSIS — R110 Nausea: Secondary | ICD-10-CM

## 2021-08-15 MED ORDER — DICYCLOMINE HCL 10 MG PO CAPS: ORAL_CAPSULE | ORAL | 3 refills | Status: AC

## 2021-08-15 NOTE — Patient Instructions (Signed)
If you are age 52 or older, your body mass index should be between 23-30. Your Body mass index is 49.48 kg/m. If this is out of the aforementioned range listed, please consider follow up with your Primary Care Provider.  If you are age 2 or younger, your body mass index should be between 19-25. Your Body mass index is 49.48 kg/m. If this is out of the aformentioned range listed, please consider follow up with your Primary Care Provider.   ________________________________________________________  The [facility] providers would like to encourage you to use [facility] to communicate with providers for non-urgent requests or questions.  Due to long hold times on the telephone, sending your provider a message by [facility] may be a faster and more efficient way to get a response.  Please allow 48 business hours for a response.  Please remember that this is for non-urgent requests.  _______________________________________________________  Your provider has requested that you go to the basement level for lab work before leaving today. Press "B" on the elevator. The lab is located at the first door on the left as you exit the elevator.  Due to recent changes in healthcare laws, you may see the results of your imaging and laboratory studies on MyChart before your provider has had a chance to review them.  We understand that in some cases there may be results that are confusing or concerning to you. Not all laboratory results come back in the same time frame and the provider may be waiting for multiple results in order to interpret others.  Please give [place] 48 hours in order for your provider to thoroughly review all the results before contacting the office for clarification of your results.   It was a pleasure to see you today!  Thank you for trusting me with your gastrointestinal care!

## 2021-08-15 NOTE — Progress Notes (Signed)
Chief Complaint: Abdominal pain and diarrhea  HPI:    [name] is a 51 year old female, known to Dr. Silverio Decamp, with a past medical history as listed below including benign ovarian tumor x2 (117 pounds), history of colonic polyps and dysplasia followed by [facility], who returns to clinic today for continued complaint of right lower quadrant pain with some diarrhea.    05/24/2019 patient seen in clinic by Alonza Bogus, PA.  That time discussed right lower quadrant pain.  Also discussed a colonoscopy with Dr. Silverio Decamp found to have a polypoid lesion in the cecum and referred to [facility] and underwent colonoscopy with EMR on February 17, 2019.  The lesion showed focal high-grade dysplasia.  She had repeat colonoscopy in February 2020 and had a post polypectomy scar and was otherwise normal.  Repeat was recommended in a year.  At that time was describing right lower quadrant/groin pain.  Reviewed that she had 2 large benign ovarian tumors as well as a large cecal polyp requiring EMR that showed focal high-grade dysplasia.  It was discussed that since she had a recent colonoscopy would start by repeating a CT scan of the abdomen pelvis with contrast.  It was thought possibly her pain could be due to scar tissue/adhesions.  (It does not look like she ever had a CT completed).    10/25/2019 follow-up colonoscopy at [facility] continue to show post polypectomy scar.  Repeat recommended in 3 years.    Today, the patient presents to clinic and tells me that she has had off-and-on right lower quadrant pain ever since getting her polyp removed initially, this would tend to go away and stay away for a while, recently came back about 4 to 5 weeks ago and at times is so sharp that she cannot sleep.  It seems to radiate through to her back and sometimes up into her abdomen.  Now it seems to just come and go and she is not sure what is aggravating it.  Most recently over the past week or so she has also been nauseous and having to use a lot  of Pepto which does relieve this for a while.  Also describes a change in bowel habits towards loose stool that is very urgent.  Interestingly also tells me that she started Trulicity along with her Metformin about 2 to 3 months ago and all of these new GI symptoms started about 4 to 5 weeks ago.    Patient denies fever, chills, weight loss, blood in her stool, vomiting, heartburn or reflux.  Past Medical History:  Diagnosis Date   Anxiety    Benign ovarian tumor 02/2011   17 pound benign tumor- removed   Depression    Diabetes 1.5, managed as type 2 ([place])    Hypertension    Personal history of colonic polyps    Sleep apnea    Thyroid disease    Vitamin D deficiency     Past Surgical History:  Procedure Laterality Date   ABDOMINAL HYSTERECTOMY  2012   one ovary remains- due to giant benign ovarian cyst (17 lbs)   COLONOSCOPY WITH PROPOFOL N/A 02/16/2018   Procedure: COLONOSCOPY WITH PROPOFOL;  Surgeon: Jerene Bears, MD;  Location: [facility];  Service: Gastroenterology;  Laterality: N/A;   OVARIAN CYST REMOVAL      Current Outpatient Medications  Medication Sig Dispense Refill   Dulaglutide (TRULICITY) 0.96 GE/3.6OQ SOPN Inject 0.75 mg into the skin once a week. 6 mL 3   losartan (COZAAR) 25  MG tablet TAKE 1 TABLET(25 MG) BY MOUTH DAILY 90 tablet 1   Melatonin Gummies 2.5 MG CHEW Chew 2.5 mg by mouth at bedtime.      metFORMIN (GLUCOPHAGE) 500 MG tablet Take 1 tablet (500 mg total) by mouth 2 (two) times daily with a meal. 180 tablet 3   Multiple Vitamins-Minerals (MULTIVITAMIN WITH MINERALS) tablet Take 1 tablet by mouth at bedtime. Reported on 07/24/2015     Rimegepant Sulfate (NURTEC) 75 MG TBDP Take 75 mg by mouth daily as needed. 16 tablet 5   rosuvastatin (CRESTOR) 20 MG tablet Take 1 tablet (20 mg total) by mouth daily. 90 tablet 1   thyroid (NP THYROID) 30 MG tablet TAKE 2 AND 1/2 TABLETS BY MOUTH EVERY DAY BEFORE BREAKFAST 225 tablet 1   No current  facility-administered medications for this visit.    Allergies as of 08/15/2021 - Review Complete 08/15/2021  Allergen Reaction Noted   Levothyroxine sodium Anxiety and Palpitations 09/27/2011   Lisinopril Cough 01/09/2019   Levothyroxine Palpitations 01/20/2018    Family History  Problem Relation Age of Onset   Heart disease Father    Stroke Father    Heart attack Father    Hyperlipidemia Father    Hypertension Father    Colon polyps Father        benign   Heart disease Paternal Grandfather    Heart attack Paternal Grandfather    COPD Mother    Congestive Heart Failure Mother    Lung cancer Mother        highly suspected, waiting on definitive bx 03/14/18   Gallbladder disease Maternal Grandmother    Stroke Paternal Grandmother    Colon cancer Neg Hx    Stomach cancer Neg Hx     Social History   Socioeconomic History   Marital status: Married    Spouse name: Helene Kelp   Number of children: 1   Years of education: Not on file   Highest education level: Bachelor's degree (e.g., BA, AB, BS)  Occupational History   Occupation: Mental Health Professional  Tobacco Use   Smoking status: Never   Smokeless tobacco: Never  Vaping Use   Vaping Use: Never used  Substance and Sexual Activity   Alcohol use: No    Alcohol/week: 0.0 standard drinks    Comment: socially   Drug use: No   Sexual activity: Yes  Other Topics Concern   Not on file  Social History Narrative   Significant other   Education: College   Exercise: Yes      Right handed   One story home    Lives with spouse   Social Determinants of Health   Financial Resource Strain: Not on file  Food Insecurity: Not on file  Transportation Needs: Not on file  Physical Activity: Not on file  Stress: Not on file  Social Connections: Not on file  Intimate Partner Violence: Not on file    Review of Systems:    Constitutional: No weight loss, fever or chills Cardiovascular: No chest pain Respiratory: No SOB   Gastrointestinal: See HPI and otherwise negative   Physical Exam:  Vital signs: BP 128/80    Pulse 82    Ht 5\' 4"  (1.626 m)    Wt 288 lb 4 oz (130.7 kg)    BMI 49.48 kg/m   Constitutional:   Pleasant obese Caucasian female appears to be in NAD, Well developed, Well nourished, alert and cooperative Respiratory: Respirations even and unlabored. Lungs clear to auscultation  bilaterally.   No wheezes, crackles, or rhonchi.  Cardiovascular: Normal S1, S2. No MRG. Regular rate and rhythm. No peripheral edema, cyanosis or pallor.  Gastrointestinal:  Soft, nondistended, mild right lower quadrant TTP (more in the groin area) no rebound or guarding. Normal bowel sounds. No appreciable masses or hepatomegaly. Rectal:  Not performed.  Psychiatric: Oriented to person, place and time. Demonstrates good judgement and reason without abnormal affect or behaviors.  RELEVANT LABS AND IMAGING: CBC    Component Value Date/Time   WBC 8.0 04/24/2021 0918   RBC 4.33 04/24/2021 0918   HGB 13.5 04/24/2021 0918   HCT 39.9 04/24/2021 0918   PLT 316.0 04/24/2021 0918   MCV 92.0 04/24/2021 0918   MCV 97.7 (A) 08/14/2013 1801   MCH 31.1 08/29/2019 1150   MCHC 33.9 04/24/2021 0918   RDW 12.6 04/24/2021 0918   LYMPHSABS 2.6 08/29/2019 1150   MONOABS 0.7 08/29/2019 1150   EOSABS 0.1 08/29/2019 1150   BASOSABS 0.1 08/29/2019 1150    CMP     Component Value Date/Time   NA 137 04/24/2021 0918   NA 142 09/23/2018 1633   K 4.3 04/24/2021 0918   CL 101 04/24/2021 0918   CO2 27 04/24/2021 0918   GLUCOSE 167 (H) 04/24/2021 0918   BUN 15 04/24/2021 0918   BUN 15 09/23/2018 1633   CREATININE 0.68 04/24/2021 0918   CREATININE 0.60 05/26/2019 1616   CALCIUM 9.3 04/24/2021 0918   PROT 7.4 04/24/2021 0918   PROT 7.5 02/06/2020 0947   ALBUMIN 4.3 04/24/2021 0918   ALBUMIN 4.4 02/06/2020 0947   AST 14 04/24/2021 0918   ALT 20 04/24/2021 0918   ALKPHOS 84 04/24/2021 0918   BILITOT 0.6 04/24/2021 0918   BILITOT  0.4 02/06/2020 0947   GFRNONAA >60 08/29/2019 1150   GFRNONAA 108 05/26/2019 1616   GFRAA >60 08/29/2019 1150   GFRAA 125 05/26/2019 1616    Assessment: 1.  Chronic right lower quadrant/groin pain: Thought possibly related to scar tissue in the area before, patient tells me at times it is sharp, not sure if is related to movement; consider scar tissue versus IBS versus other 2.  History of EMR at [facility] for focal high-grade dysplasia and a large cecal polyp: Last colonoscopy in March 2021 with them with recommendations to repeat in 3 years 3.  Nausea: New with some diarrhea over the past 4 to 5 weeks, started Trulicity in addition to Metformin about 2 months ago, no heartburn or reflux; consider relation to Trulicity and Metformin versus gastritis versus infectious cause versus IBS 4.  Diarrhea  Plan: 1.  Discussed with patient that her new symptoms of nausea diarrhea and pain could be related to her Trulicity and metformin mixture.  Would recommend she discuss this further with her PCP to see if they could possibly find an alternative. 2.  With patient's history could also represent scar tissue in this area as discussed before. 3.  At this point with change in bowel habits towards loose stool we will order stool studies including a GI pathogen panel, O&P, fecal lactoferrin and calprotectin.  Pending results of this if all normal would recommend that we do a CT AP with contrast as suggested at last visit. 4.  Tried to reassure the patient that she has had frequent follow-up for this abnormal polyp with her last colonoscopy in 2021.  Recommendations per [facility] at that time were repeat in 3 years.  I do not think she needs emergent repeat  colonoscopy at this time. 5.  Prescribed Dicyclomine 10 mg twice daily, 20 to 30 minutes before breakfast and again before dinner.  #60 with 3 refills.  Discussed that if this is not helping we can increase dose in the future pending labs. 6.  Patient follow in clinic  per recommendations after labs.  Ellouise Newer, PA-C [facility] 08/15/2021, 3:54 PM  Cc: Darreld Mclean, MD

## 2021-08-21 ENCOUNTER — Other Ambulatory Visit: Payer: BC Managed Care – PPO

## 2021-08-21 DIAGNOSIS — R1031 Right lower quadrant pain: Secondary | ICD-10-CM | POA: Diagnosis not present

## 2021-08-21 DIAGNOSIS — R197 Diarrhea, unspecified: Secondary | ICD-10-CM

## 2021-08-21 NOTE — Addendum Note (Signed)
Addended by: Yevette Edwards on: 08/21/2021 11:46 AM   Modules accepted: Orders

## 2021-08-22 ENCOUNTER — Telehealth: Payer: Self-pay | Admitting: Family Medicine

## 2021-08-22 ENCOUNTER — Other Ambulatory Visit: Payer: Self-pay | Admitting: Family Medicine

## 2021-08-22 DIAGNOSIS — E118 Type 2 diabetes mellitus with unspecified complications: Secondary | ICD-10-CM

## 2021-08-22 NOTE — Telephone Encounter (Signed)
Would you like for her to have a POC or lab A1C?

## 2021-08-22 NOTE — Telephone Encounter (Signed)
Pt called to schedule appointment to get her a1c checked and shingles #2 shot. There were no active requests, but mychart message and avs did indicate she would needs these to be scheduled. Scheduled her 2/9, please advise. If Dr. Lorelei Pont would like her to keep these appts.

## 2021-08-23 LAB — GI PROFILE, STOOL, PCR

## 2021-08-24 LAB — CALPROTECTIN, FECAL: Calprotectin, Fecal: <16 ug/g (ref 0–120)

## 2021-08-26 LAB — FECAL LACTOFERRIN, QUANT
Fecal Lactoferrin: NEGATIVE
MICRO NUMBER:: 12892540
SPECIMEN QUALITY:: ADEQUATE

## 2021-08-26 LAB — OVA AND PARASITE EXAMINATION
CONCENTRATE RESULT:: NONE SEEN
MICRO NUMBER:: 12892302
SPECIMEN QUALITY:: ADEQUATE
TRICHROME RESULT:: NONE SEEN

## 2021-08-26 NOTE — Progress Notes (Signed)
Reviewed and agree with documentation and assessment and plan. K. Veena Melicia Esqueda , MD   

## 2021-08-27 ENCOUNTER — Other Ambulatory Visit: Payer: Self-pay

## 2021-08-27 DIAGNOSIS — R194 Change in bowel habit: Secondary | ICD-10-CM

## 2021-08-27 DIAGNOSIS — R1031 Right lower quadrant pain: Secondary | ICD-10-CM

## 2021-08-27 DIAGNOSIS — R110 Nausea: Secondary | ICD-10-CM

## 2021-09-04 ENCOUNTER — Encounter (HOSPITAL_COMMUNITY): Payer: Self-pay

## 2021-09-04 ENCOUNTER — Ambulatory Visit (HOSPITAL_COMMUNITY)
Admission: RE | Admit: 2021-09-04 | Discharge: 2021-09-04 | Disposition: A | Discharge status: Home / Self Care | Payer: BC Managed Care – PPO | Source: Ambulatory Visit | Attending: Physician Assistant | Admitting: Physician Assistant

## 2021-09-04 ENCOUNTER — Other Ambulatory Visit: Payer: Self-pay

## 2021-09-04 ENCOUNTER — Telehealth: Payer: Self-pay | Admitting: Internal Medicine

## 2021-09-04 DIAGNOSIS — R110 Nausea: Secondary | ICD-10-CM | POA: Insufficient documentation

## 2021-09-04 DIAGNOSIS — R1031 Right lower quadrant pain: Secondary | ICD-10-CM | POA: Diagnosis not present

## 2021-09-04 DIAGNOSIS — R109 Unspecified abdominal pain: Secondary | ICD-10-CM | POA: Diagnosis not present

## 2021-09-04 DIAGNOSIS — K760 Fatty (change of) liver, not elsewhere classified: Secondary | ICD-10-CM | POA: Diagnosis not present

## 2021-09-04 DIAGNOSIS — R194 Change in bowel habit: Secondary | ICD-10-CM | POA: Diagnosis not present

## 2021-09-04 DIAGNOSIS — E782 Mixed hyperlipidemia: Secondary | ICD-10-CM

## 2021-09-04 LAB — POCT I-STAT CREATININE: Creatinine, Ser: 0.6 mg/dL (ref 0.44–1.00)

## 2021-09-04 MED ORDER — ROSUVASTATIN CALCIUM 20 MG PO TABS
20.0000 mg | ORAL_TABLET | Freq: Every day | ORAL | 1 refills | Status: DC
Start: 2021-09-04 — End: 2022-03-12

## 2021-09-04 MED ORDER — SODIUM CHLORIDE (PF) 0.9 % IJ SOLN
INTRAMUSCULAR | Status: AC
  Filled 2021-09-04: qty 50

## 2021-09-04 MED ORDER — IOHEXOL 300 MG/ML  SOLN
100.0000 mL | Freq: Once | INTRAMUSCULAR | Status: AC | PRN
  Administered 2021-09-04: 100 mL via INTRAVENOUS

## 2021-09-04 NOTE — Telephone Encounter (Signed)
°*  STAT* If patient is at the pharmacy, call can be transferred to refill team.   1. Which medications need to be refilled? (please list name of each medication and dose if known)  rosuvastatin (CRESTOR) 20 MG tablet  2. Which pharmacy/location (including street and city if local pharmacy) is medication to be sent to?  3. Do they need a 30 day or 90 day supply? 90 with refills  Patient is scheduled to see Dr. Margaretann Loveless 12/08/21  Patient is out of medication

## 2021-09-09 ENCOUNTER — Telehealth: Payer: Self-pay

## 2021-09-09 NOTE — Telephone Encounter (Signed)
Yes, she had surveillance colonoscopy after large polypectomy at [facility] in 2020 and 2021.  She is due for surveillance colonoscopy in March 2024. Can you please check if the recall is placed in our system and get rid of the old recalls.  Thank you

## 2021-09-09 NOTE — Telephone Encounter (Signed)
Called and spoke with patient. She is aware that she will be due for colonoscopy in 10/2022. She knows that she will receive a letter closer to that time.   Recall colonoscopy in epic.

## 2021-09-09 NOTE — Telephone Encounter (Signed)
Dr. Silverio Decamp, I called this patient to review her CT results. Pt states that she kept receiving a notification in my chart that she is overdue for a colonoscopy. Pt had a colonoscopy at [facility] in 2020 and 2021. I told pt that I will send a note to Dr. Silverio Decamp to review. Please advise on when pt is due for recall, thanks.

## 2021-09-11 ENCOUNTER — Ambulatory Visit (INDEPENDENT_AMBULATORY_CARE_PROVIDER_SITE_OTHER): Payer: BC Managed Care – PPO

## 2021-09-11 ENCOUNTER — Other Ambulatory Visit (INDEPENDENT_AMBULATORY_CARE_PROVIDER_SITE_OTHER): Payer: BC Managed Care – PPO

## 2021-09-11 DIAGNOSIS — E118 Type 2 diabetes mellitus with unspecified complications: Secondary | ICD-10-CM | POA: Diagnosis not present

## 2021-09-11 DIAGNOSIS — Z23 Encounter for immunization: Secondary | ICD-10-CM | POA: Diagnosis not present

## 2021-09-11 LAB — HEMOGLOBIN A1C: Hgb A1c MFr Bld: 8.8 % — ABNORMAL HIGH (ref 4.6–6.5)

## 2021-09-12 ENCOUNTER — Other Ambulatory Visit: Payer: Self-pay | Admitting: Family Medicine

## 2021-09-12 ENCOUNTER — Encounter: Payer: Self-pay | Admitting: Family Medicine

## 2021-09-12 DIAGNOSIS — I10 Essential (primary) hypertension: Secondary | ICD-10-CM

## 2021-09-12 DIAGNOSIS — E118 Type 2 diabetes mellitus with unspecified complications: Secondary | ICD-10-CM

## 2021-09-15 MED ORDER — TRULICITY 1.5 MG/0.5ML ~~LOC~~ SOAJ: 1.5000 mg | SUBCUTANEOUS | 1 refills | Status: DC

## 2021-11-30 DIAGNOSIS — G4733 Obstructive sleep apnea (adult) (pediatric): Secondary | ICD-10-CM | POA: Diagnosis not present

## 2021-12-08 ENCOUNTER — Ambulatory Visit: Payer: BC Managed Care – PPO | Admitting: Internal Medicine

## 2021-12-19 ENCOUNTER — Other Ambulatory Visit: Payer: Self-pay | Admitting: Family Medicine

## 2021-12-19 DIAGNOSIS — Z1231 Encounter for screening mammogram for malignant neoplasm of breast: Secondary | ICD-10-CM

## 2021-12-31 ENCOUNTER — Ambulatory Visit
Admission: RE | Admit: 2021-12-31 | Discharge: 2021-12-31 | Disposition: A | Discharge status: Home / Self Care | Payer: BC Managed Care – PPO | Source: Ambulatory Visit | Attending: Family Medicine | Admitting: Family Medicine

## 2021-12-31 DIAGNOSIS — Z1231 Encounter for screening mammogram for malignant neoplasm of breast: Secondary | ICD-10-CM | POA: Diagnosis not present

## 2022-01-01 ENCOUNTER — Other Ambulatory Visit: Payer: Self-pay

## 2022-01-01 ENCOUNTER — Telehealth: Payer: Self-pay | Admitting: Family Medicine

## 2022-01-01 DIAGNOSIS — E118 Type 2 diabetes mellitus with unspecified complications: Secondary | ICD-10-CM

## 2022-01-01 MED ORDER — TRULICITY 1.5 MG/0.5ML ~~LOC~~ SOAJ: 1.5000 mg | SUBCUTANEOUS | 1 refills | Status: DC

## 2022-01-01 NOTE — Telephone Encounter (Signed)
Refill sent.

## 2022-01-01 NOTE — Telephone Encounter (Signed)
Medication: Dulaglutide (TRULICITY) 1.5 VB/1.6OM SOPN   Has the patient contacted their pharmacy? No.  Preferred Pharmacy (with phone number or street name):  [id number] - [address], [address]  [place], [place] [address]  Phone:  [phone]  Fax:  [phone]   Agent: Please be advised that RX refills may take up to 3 business days. We ask that you follow-up with your pharmacy.

## 2022-01-18 NOTE — Patient Instructions (Incomplete)
Good to see you again today!  Please see me in about 6 months

## 2022-01-18 NOTE — Progress Notes (Unsigned)
[place] [facility] [address], [place], [place] [address] [phone]  Date:  01/26/2022   Name:  [name]   DOB:  [birth year]   MRN:  [id number]  PCP:  Darreld Mclean, MD    Chief Complaint: No chief complaint on file.   History of Present Illness:  [name] is a 51 y.o. very pleasant female patient who presents with the following:  Seen today for follow-up Last seen by myself in September  History of diabetes, hypothyroidism, obesity, benign ovarian tumor Colon- 2020, recommended recheck in one year - however it looks like she was told by GI she actually is due for recheck next year Rare headache, uses nurtec as needed  We started trulicity in September Finished her shingrix  Lab Results  Component Value Date   HGBA1C 8.8 (H) 78/24/2353   Trulicity Losartan Metformin Crestor NP thyroid  Patient Active Problem List   Diagnosis Date Noted   Controlled diabetes mellitus type 2 with complications ([place]) 61/44/3154   History of benign ovarian tumor 05/24/2019   Personal history of colonic polyps    H/O colonoscopy with polypectomy    Acute lower GI bleeding 02/16/2018   Acute blood loss anemia 02/16/2018   Obesity, unspecified 07/07/2013   Hypothyroidism 09/27/2011    Past Medical History:  Diagnosis Date   Anxiety    Benign ovarian tumor 02/2011   17 pound benign tumor- removed   Depression    Diabetes 1.5, managed as type 2 ([place])    Hypertension    Personal history of colonic polyps    Sleep apnea    Thyroid disease    Vitamin D deficiency     Past Surgical History:  Procedure Laterality Date   ABDOMINAL HYSTERECTOMY  2012   one ovary remains- due to giant benign ovarian cyst (17 lbs)   COLONOSCOPY WITH PROPOFOL N/A 02/16/2018   Procedure: COLONOSCOPY WITH PROPOFOL;  Surgeon: Jerene Bears, MD;  Location: [facility];  Service: Gastroenterology;  Laterality: N/A;   OVARIAN CYST  REMOVAL      Social History   Tobacco Use   Smoking status: Never   Smokeless tobacco: Never  Vaping Use   Vaping Use: Never used  Substance Use Topics   Alcohol use: No    Alcohol/week: 0.0 standard drinks of alcohol    Comment: socially   Drug use: No    Family History  Problem Relation Age of Onset   Heart disease Father    Stroke Father    Heart attack Father    Hyperlipidemia Father    Hypertension Father    Colon polyps Father        benign   Heart disease Paternal Grandfather    Heart attack Paternal Grandfather    COPD Mother    Congestive Heart Failure Mother    Lung cancer Mother        highly suspected, waiting on definitive bx 03/14/18   Gallbladder disease Maternal Grandmother    Stroke Paternal Grandmother    Colon cancer Neg Hx    Stomach cancer Neg Hx     Allergies  Allergen Reactions   Levothyroxine Sodium Anxiety and Palpitations   Lisinopril Cough   Levothyroxine Palpitations    Medication list has been reviewed and updated.  Current Outpatient Medications on File Prior to Visit  Medication Sig Dispense Refill   dicyclomine (BENTYL) 10 MG capsule Take 1 20-30 minutes before  breakfast and dinner 60 capsule 3   Dulaglutide (TRULICITY) 1.5 OV/5.6EP SOPN Inject 1.5 mg into the skin once a week. 6 mL 1   losartan (COZAAR) 25 MG tablet TAKE 1 TABLET(25 MG) BY MOUTH DAILY 90 tablet 1   Melatonin Gummies 2.5 MG CHEW Chew 2.5 mg by mouth at bedtime.      metFORMIN (GLUCOPHAGE) 500 MG tablet Take 1 tablet (500 mg total) by mouth 2 (two) times daily with a meal. 180 tablet 3   Multiple Vitamins-Minerals (MULTIVITAMIN WITH MINERALS) tablet Take 1 tablet by mouth at bedtime. Reported on 07/24/2015     Rimegepant Sulfate (NURTEC) 75 MG TBDP Take 75 mg by mouth daily as needed. 16 tablet 5   rosuvastatin (CRESTOR) 20 MG tablet Take 1 tablet (20 mg total) by mouth daily. 90 tablet 1   thyroid (NP THYROID) 30 MG tablet TAKE 2 AND 1/2 TABLETS BY MOUTH EVERY  DAY BEFORE BREAKFAST 225 tablet 1   No current facility-administered medications on file prior to visit.    Review of Systems:  As per HPI- otherwise negative.   Physical Examination: There were no vitals filed for this visit. There were no vitals filed for this visit. There is no height or weight on file to calculate BMI. Ideal Body Weight:    GEN: no acute distress. HEENT: Atraumatic, Normocephalic.  Ears and Nose: No external deformity. CV: RRR, No M/G/R. No JVD. No thrill. No extra heart sounds. PULM: CTA B, no wheezes, crackles, rhonchi. No retractions. No resp. distress. No accessory muscle use. ABD: S, NT, ND, +BS. No rebound. No HSM. EXTR: No c/c/e PSYCH: Normally interactive. Conversant.    Assessment and Plan: ***  Signed Lamar Blinks, MD

## 2022-01-26 ENCOUNTER — Encounter: Payer: Self-pay | Admitting: Family Medicine

## 2022-01-26 ENCOUNTER — Ambulatory Visit (INDEPENDENT_AMBULATORY_CARE_PROVIDER_SITE_OTHER): Payer: BC Managed Care – PPO | Admitting: Family Medicine

## 2022-01-26 VITALS — BP 132/88 | HR 70 | Temp 98.6°F | Resp 18 | Ht 64.0 in | Wt 284.8 lb

## 2022-01-26 DIAGNOSIS — E039 Hypothyroidism, unspecified: Secondary | ICD-10-CM | POA: Diagnosis not present

## 2022-01-26 DIAGNOSIS — E118 Type 2 diabetes mellitus with unspecified complications: Secondary | ICD-10-CM

## 2022-01-26 DIAGNOSIS — I10 Essential (primary) hypertension: Secondary | ICD-10-CM

## 2022-01-26 DIAGNOSIS — E119 Type 2 diabetes mellitus without complications: Secondary | ICD-10-CM

## 2022-01-26 DIAGNOSIS — E785 Hyperlipidemia, unspecified: Secondary | ICD-10-CM

## 2022-01-26 LAB — CBC
HCT: 40.5 % (ref 36.0–46.0)
Hemoglobin: 13.7 g/dL (ref 12.0–15.0)
MCHC: 33.9 g/dL (ref 30.0–36.0)
MCV: 92.3 fl (ref 78.0–100.0)
Platelets: 291 10*3/uL (ref 150.0–400.0)
RBC: 4.38 Mil/uL (ref 3.87–5.11)
RDW: 12.8 % (ref 11.5–15.5)
WBC: 7.4 10*3/uL (ref 4.0–10.5)

## 2022-01-26 LAB — COMPREHENSIVE METABOLIC PANEL
ALT: 27 U/L (ref 0–35)
AST: 20 U/L (ref 0–37)
Albumin: 4.4 g/dL (ref 3.5–5.2)
Alkaline Phosphatase: 93 U/L (ref 39–117)
BUN: 12 mg/dL (ref 6–23)
CO2: 26 mEq/L (ref 19–32)
Calcium: 9.5 mg/dL (ref 8.4–10.5)
Chloride: 100 mEq/L (ref 96–112)
Creatinine, Ser: 0.61 mg/dL (ref 0.40–1.20)
GFR: 103.4 mL/min (ref >60.00)
Glucose, Bld: 210 mg/dL — ABNORMAL HIGH (ref 70–99)
Potassium: 4 mEq/L (ref 3.5–5.1)
Sodium: 136 mEq/L (ref 135–145)
Total Bilirubin: 0.7 mg/dL (ref 0.2–1.2)
Total Protein: 7.4 g/dL (ref 6.0–8.3)

## 2022-01-26 LAB — LIPID PANEL
Cholesterol: 120 mg/dL (ref 0–200)
HDL: 38 mg/dL — ABNORMAL LOW (ref >39.00)
LDL Cholesterol: 49 mg/dL (ref 0–99)
NonHDL: 82.15
Total CHOL/HDL Ratio: 3
Triglycerides: 164 mg/dL — ABNORMAL HIGH (ref 0.0–149.0)
VLDL: 32.8 mg/dL (ref 0.0–40.0)

## 2022-01-26 LAB — TSH: TSH: 8.77 u[IU]/mL — ABNORMAL HIGH (ref 0.35–5.50)

## 2022-01-26 LAB — HEMOGLOBIN A1C: Hgb A1c MFr Bld: 10.1 % — ABNORMAL HIGH (ref 4.6–6.5)

## 2022-01-26 MED ORDER — TRULICITY 3 MG/0.5ML ~~LOC~~ SOAJ: 3.0000 mg | SUBCUTANEOUS | 3 refills | Status: DC

## 2022-01-26 MED ORDER — THYROID 30 MG PO TABS: ORAL_TABLET | ORAL | 1 refills | Status: DC

## 2022-01-30 ENCOUNTER — Ambulatory Visit (HOSPITAL_BASED_OUTPATIENT_CLINIC_OR_DEPARTMENT_OTHER)
Admission: RE | Admit: 2022-01-30 | Discharge: 2022-01-30 | Disposition: A | Discharge status: Home / Self Care | Payer: BC Managed Care – PPO | Source: Ambulatory Visit | Attending: Family Medicine | Admitting: Family Medicine

## 2022-01-30 DIAGNOSIS — E785 Hyperlipidemia, unspecified: Secondary | ICD-10-CM

## 2022-01-30 DIAGNOSIS — E118 Type 2 diabetes mellitus with unspecified complications: Secondary | ICD-10-CM

## 2022-01-30 DIAGNOSIS — I10 Essential (primary) hypertension: Secondary | ICD-10-CM

## 2022-02-01 ENCOUNTER — Encounter: Payer: Self-pay | Admitting: Family Medicine

## 2022-03-03 ENCOUNTER — Other Ambulatory Visit: Payer: Self-pay | Admitting: Internal Medicine

## 2022-03-03 ENCOUNTER — Other Ambulatory Visit: Payer: Self-pay | Admitting: Family Medicine

## 2022-03-03 DIAGNOSIS — E119 Type 2 diabetes mellitus without complications: Secondary | ICD-10-CM

## 2022-03-03 DIAGNOSIS — E782 Mixed hyperlipidemia: Secondary | ICD-10-CM

## 2022-03-03 DIAGNOSIS — I10 Essential (primary) hypertension: Secondary | ICD-10-CM

## 2022-03-11 ENCOUNTER — Other Ambulatory Visit: Payer: Self-pay | Admitting: Family Medicine

## 2022-03-11 ENCOUNTER — Telehealth: Payer: Self-pay | Admitting: Internal Medicine

## 2022-03-11 DIAGNOSIS — E039 Hypothyroidism, unspecified: Secondary | ICD-10-CM

## 2022-03-11 DIAGNOSIS — E782 Mixed hyperlipidemia: Secondary | ICD-10-CM

## 2022-03-11 NOTE — Telephone Encounter (Signed)
*  STAT* If patient is at the pharmacy, call can be transferred to refill team.   1. Which medications need to be refilled? (please list name of each medication and dose if known)   rosuvastatin (CRESTOR) 20 MG tablet    2. Which pharmacy/location (including street and city if local pharmacy) is medication to be sent to?  [place], [place] 3. Do they need a 30 day or 90 day supply?  90 day

## 2022-03-12 DIAGNOSIS — L814 Other melanin hyperpigmentation: Secondary | ICD-10-CM | POA: Diagnosis not present

## 2022-03-12 DIAGNOSIS — L298 Other pruritus: Secondary | ICD-10-CM | POA: Diagnosis not present

## 2022-03-12 DIAGNOSIS — L218 Other seborrheic dermatitis: Secondary | ICD-10-CM | POA: Diagnosis not present

## 2022-03-12 DIAGNOSIS — L820 Inflamed seborrheic keratosis: Secondary | ICD-10-CM | POA: Diagnosis not present

## 2022-03-12 DIAGNOSIS — L538 Other specified erythematous conditions: Secondary | ICD-10-CM | POA: Diagnosis not present

## 2022-03-12 DIAGNOSIS — L720 Epidermal cyst: Secondary | ICD-10-CM | POA: Diagnosis not present

## 2022-03-12 DIAGNOSIS — L821 Other seborrheic keratosis: Secondary | ICD-10-CM | POA: Diagnosis not present

## 2022-03-12 MED ORDER — ROSUVASTATIN CALCIUM 20 MG PO TABS: 20.0000 mg | ORAL_TABLET | Freq: Every day | ORAL | 0 refills | Status: DC

## 2022-05-13 ENCOUNTER — Ambulatory Visit: Payer: BC Managed Care – PPO | Attending: Internal Medicine | Admitting: Internal Medicine

## 2022-05-13 ENCOUNTER — Encounter: Payer: Self-pay | Admitting: Internal Medicine

## 2022-05-13 VITALS — BP 126/82 | HR 76 | Ht 64.0 in | Wt 283.0 lb

## 2022-05-13 DIAGNOSIS — I2510 Atherosclerotic heart disease of native coronary artery without angina pectoris: Secondary | ICD-10-CM

## 2022-05-13 DIAGNOSIS — I10 Essential (primary) hypertension: Secondary | ICD-10-CM

## 2022-05-13 DIAGNOSIS — E782 Mixed hyperlipidemia: Secondary | ICD-10-CM

## 2022-05-13 DIAGNOSIS — R0789 Other chest pain: Secondary | ICD-10-CM

## 2022-05-13 DIAGNOSIS — M79604 Pain in right leg: Secondary | ICD-10-CM | POA: Diagnosis not present

## 2022-05-13 DIAGNOSIS — Z79899 Other long term (current) drug therapy: Secondary | ICD-10-CM

## 2022-05-13 DIAGNOSIS — I2584 Coronary atherosclerosis due to calcified coronary lesion: Secondary | ICD-10-CM

## 2022-05-13 DIAGNOSIS — Z6841 Body Mass Index (BMI) 40.0 and over, adult: Secondary | ICD-10-CM

## 2022-05-13 MED ORDER — LOSARTAN POTASSIUM 25 MG PO TABS: 25.0000 mg | ORAL_TABLET | Freq: Every day | ORAL | 3 refills | Status: DC

## 2022-05-13 MED ORDER — ROSUVASTATIN CALCIUM 20 MG PO TABS: 20.0000 mg | ORAL_TABLET | Freq: Every day | ORAL | 3 refills | Status: DC

## 2022-05-13 NOTE — Progress Notes (Addendum)
Cardiology Office Note:    Date:  05/13/2022  ID:  [name], DOB 04/23/1971, MRN [id number]  PCP:  Darreld Mclean, MD  Cardiologist:  Elouise Munroe, MD  Electrophysiologist:  None   Referring MD: Darreld Mclean, MD   Chief Complaint/Reason for Referral: Follow up  History of Present Illness:    [name] is a 51 y.o. female with a history of benign ovarian tumor status post hysterectomy and oophorectomy, colon polyp status post excision, and weight gain who presents today for follow up of chest pain, HTN, and HLD.     At her last appointment she was concerned about right leg discomfort for several days. RLE doppler 05/2021 was normal, negative for DVT. She also complained of left breast pain. Unsure if this was skin/MSK or cardiac. We reviewed prior cardiac testing including echocardiogram and CCTA with minimal CAD. Echo showed normal EF and no signs of pulmonary HTN with normal RVSP.   She had a calcium scoring CT 01/2022 revealing a coronary calcium score of 10 and dilated ascending aorta measuring 26m. Prior CT 09/2018 showed a calcium score of 5.  Her ascending aorta was noted as normal on her angiography study from 2020 read by Dr. NMeda Coffee  I have independently looked at the images from her calcium scoring CT, this is a noncontrast study and the aorta is slightly oblique for measurement.  May be reasonable to obtain an echocardiogram in 1 year to revisit aorta measurements.    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 Lately this consumes much of her time and is very stressful.  She anticipates her mother will come see me as a new patient, [name]  Overall she is feeling better with no significant chest pain. However, she does complain of very brief, sporadic pains in her chest that may be as rare as once a month.   Also she still has the discomfort in her RLE noted at her last visit, which  she describes as a sensation of "something bubbling through her leg". This seems to be occurring in her dorsal RLE inferior to the knee. It does not feel like she is swelling.  Currently she is taking Crestor 20 mg. As of 01/26/22 her labs show LDL 49, triglycerides 164. Her A1c was 10.1 at that time. Initially she was on Metformin. Trulicity was added but she has not noticed any significant changes.  She remains compliant with her CPAP faithfully.  She denies any palpitations, or shortness of breath. No lightheadedness, headaches, syncope, orthopnea, or PND.   Past Medical History:  Diagnosis Date   Anxiety    Benign ovarian tumor 02/2011   17 pound benign tumor- removed   Depression    Diabetes 1.5, managed as type 2 ([place]    Hypertension    Personal history of colonic polyps    Sleep apnea    Thyroid disease    Vitamin D deficiency     Past Surgical History:  Procedure Laterality Date   ABDOMINAL HYSTERECTOMY  2012   one ovary remains- due to giant benign ovarian cyst (17 lbs)   COLONOSCOPY WITH PROPOFOL N/A 02/16/2018   Procedure: COLONOSCOPY WITH PROPOFOL;  Surgeon: PJerene Bears MD;  Location: [facility];  Service: Gastroenterology;  Laterality: N/A;   OVARIAN CYST REMOVAL      Current Medications: Current Meds  Medication Sig   dicyclomine (BENTYL) 10 MG capsule Take 1 20-30  minutes before breakfast and dinner   Dulaglutide (TRULICITY) 3 TI/1.4ER SOPN Inject 3 mg as directed once a week.   Melatonin Gummies 2.5 MG CHEW Chew 2.5 mg by mouth at bedtime.    metFORMIN (GLUCOPHAGE) 500 MG tablet TAKE 1 TABLET(500 MG) BY MOUTH TWICE DAILY WITH A MEAL   Multiple Vitamins-Minerals (MULTIVITAMIN WITH MINERALS) tablet Take 1 tablet by mouth at bedtime. Reported on 07/24/2015   Rimegepant Sulfate (NURTEC) 75 MG TBDP Take 75 mg by mouth daily as needed.   thyroid (NP THYROID) 30 MG tablet TAKE 3 TABLETS (90 mg) BY MOUTH EVERY DAY BEFORE BREAKFAST   [DISCONTINUED] losartan  (COZAAR) 25 MG tablet TAKE 1 TABLET(25 MG) BY MOUTH DAILY   [DISCONTINUED] rosuvastatin (CRESTOR) 20 MG tablet Take 1 tablet (20 mg total) by mouth daily. KEEP UPCOMING APPOINTMENT FOR FUTURE REFILLS.     Allergies:   Levothyroxine sodium, Lisinopril, and Levothyroxine   Social History   Tobacco Use   Smoking status: Never   Smokeless tobacco: Never  Vaping Use   Vaping Use: Never used  Substance Use Topics   Alcohol use: No    Alcohol/week: 0.0 standard drinks of alcohol    Comment: socially   Drug use: No     Family History: The patient's family history includes COPD in her mother; Colon polyps in her father; Congestive Heart Failure in her mother; Gallbladder disease in her maternal grandmother; Heart attack in her father and paternal grandfather; Heart disease in her father and paternal grandfather; Hyperlipidemia in her father; Hypertension in her father; Lung cancer in her mother; Stroke in her father and paternal grandmother. There is no history of Colon cancer or Stomach cancer.  ROS:   Please see the history of present illness.    (+) Stress (+) RLE discomfort All other systems reviewed and are negative.  EKGs/Labs/Other Studies Reviewed:    The following studies were reviewed today:  CT Calcium Score  01/30/2022: IMPRESSION: 1. Coronary calcium score of 10. This was 88th percentile for age-, race-, and sex-matched controls.   2.  Dilated ascending aorta measuring 63m  Right LE Venous Doppler  05/21/2021: Summary:  RIGHT:  - No evidence of deep vein thrombosis in the lower extremity. No indirect  evidence of obstruction proximal to the inguinal ligament.  - There is no evidence of superficial venous thrombosis.     LEFT:  - No evidence of common femoral vein obstruction.   Coronary CTA  09/26/2018: IMPRESSION: 1. Coronary calcium score of 5. This was 751percentile for age and sex matched control.   2. Normal coronary origin with right dominance.   3.  Minimal non-obstructive CAD. Risk factor modification is recommended.   4. Dilated pulmonary artery measuring 34 mm suggestive of pulmonary hypertension.  Echo  08/29/2018: Study Conclusions   - Left ventricle: The cavity size was normal. There was moderate    concentric hypertrophy. Systolic function was normal. The    estimated ejection fraction was in the range of 60% to 65%. Wall    motion was normal; there were no regional wall motion    abnormalities. Doppler parameters are consistent with abnormal    left ventricular relaxation (grade 1 diastolic dysfunction).    Acoustic contrast opacification revealed no evidence ofthrombus.  - Left atrium: The atrium was moderately to severely dilated.   EKG:  EKG is personally reviewed. 05/13/2022:  Sinus rhythm. LVH. 05/13/2021:  NSR, Minimal LVH criteria.  Imaging studies that I have independently reviewed today:  n/a  Recent Labs: 01/26/2022: ALT 27; BUN 12; Creatinine, Ser 0.61; Hemoglobin 13.7; Platelets 291.0; Potassium 4.0; Sodium 136; TSH 8.77   Recent Lipid Panel    Component Value Date/Time   CHOL 120 01/26/2022 0914   CHOL 153 02/06/2020 0947   TRIG 164.0 (H) 01/26/2022 0914   HDL 38.00 (L) 01/26/2022 0914   HDL 36 (L) 02/06/2020 0947   CHOLHDL 3 01/26/2022 0914   VLDL 32.8 01/26/2022 0914   LDLCALC 49 01/26/2022 0914   LDLCALC 88 02/06/2020 0947   LDLDIRECT 118.0 01/08/2021 1438    Physical Exam:    VS:  BP 126/82 (BP Location: Left Arm, Patient Position: Sitting, Cuff Size: Large)   Pulse 76   Ht '5\' 4"'$  (1.626 m)   Wt 283 lb (128.4 kg)   SpO2 96%   BMI 48.58 kg/m     Wt Readings from Last 5 Encounters:  05/13/22 283 lb (128.4 kg)  01/26/22 284 lb 12.8 oz (129.2 kg)  08/15/21 288 lb 4 oz (130.7 kg)  05/13/21 282 lb 6.4 oz (128.1 kg)  04/24/21 281 lb 9.6 oz (127.7 kg)    Constitutional: No acute distress Eyes: sclera non-icteric, normal conjunctiva and lids ENMT: normal dentition, moist mucous  membranes Cardiovascular: regular rhythm, normal rate, no murmur. S1 and S2 normal. No jugular venous distention.  Respiratory: clear to auscultation bilaterally GI : normal bowel sounds, soft and nontender. No distention.   MSK: extremities warm, well perfused. No edema.  NEURO: grossly nonfocal exam, moves all extremities. PSYCH: alert and oriented x 3, normal mood and affect.   ASSESSMENT:    1. Pain of right lower extremity   2. Essential hypertension   3. Mixed hyperlipidemia   4. Atypical chest pain   5. Coronary artery calcification   6. Class 3 severe obesity without serious comorbidity with body mass index (BMI) of 45.0 to 49.9 in adult, unspecified obesity type ([place])   7. Medication management     PLAN:    Pain of right lower extremity -vascular ultrasound was unremarkable, observe.  Essential hypertension - Plan: EKG 12-Lead - BP overall well controlled with losartan 25 mg daily. Can uptitrate if needed.   Mixed hyperlipidemia - continue crestor 20 mg daily. Follows lipids with PCP.  LDL is optimized at 49, triglycerides are elevated but likely secondary to elevated blood glucose with an A1c of 10.1%.  Optimal management of diabetes will be best for management of management of triglycerides.  If still elevated at follow-up, will consider up titration of Crestor or additional therapy.  Atypical chest pain -this is likely musculoskeletal with a coronary CTA showing minimal CAD in 2020 and a repeat calcium score per PCP this year of 10.  Coronary artery calcification - continue crestor 20 mg daily.  She does not need repeat coronary calcium scoring going forward, aggressive secondary prevention is recommended.  Class 3 severe obesity without serious comorbidity with body mass index (BMI) of 45.0 to 49.9 in adult, unspecified obesity type ([place]) - consider healthy weight and wellness referral if interested.  She is already on Trulicity, I have offered her review with our  pharmacist in our office if SGLT2 inhibitor or alternate GLP-1 agonist are considered.  Medication management-refills of losartan and Crestor provided today.   Follow-up:  1 year.  Total time of encounter: 30 minutes total time of encounter, including 20 minutes spent in face-to-face patient care on the date of this encounter. This time includes coordination of care and counseling  regarding above mentioned problem list. Remainder of non-face-to-face time involved reviewing chart documents/testing relevant to the patient encounter and documentation in the medical record. I have independently reviewed documentation from referring provider.   Cherlynn Kaiser, MD, [place]   Shared Decision Making/Informed Consent:       Medication Adjustments/Labs and Tests Ordered: Current medicines are reviewed at length with the patient today.  Concerns regarding medicines are outlined above.   Orders Placed This Encounter  Procedures   EKG 12-Lead   Meds ordered this encounter  Medications   losartan (COZAAR) 25 MG tablet    Sig: Take 1 tablet (25 mg total) by mouth daily.    Dispense:  90 tablet    Refill:  3   rosuvastatin (CRESTOR) 20 MG tablet    Sig: Take 1 tablet (20 mg total) by mouth daily.    Dispense:  90 tablet    Refill:  3   Patient Instructions  Medication Instructions:  No Changes In Medications at this time.  *If you need a refill on your cardiac medications before your next appointment, please call your pharmacy*  Follow-Up: At [facility], you and your health needs are our priority.  As part of our continuing mission to provide you with exceptional heart care, we have created designated Provider Care Teams.  These Care Teams include your primary Cardiologist (physician) and Advanced Practice Providers (APPs -  Physician Assistants and Nurse Practitioners) who all work together to provide you with the care you need, when you need it.  Your  next appointment:   1 year(s)  The format for your next appointment:   In Person  Provider:   Elouise Munroe, MD             [facility] Stump[facility],acting as a scribe for Elouise Munroe, MD.,have documented all relevant documentation on the behalf of Elouise Munroe, MD,as directed by  Elouise Munroe, MD while in the presence of Elouise Munroe, MD.  I, Elouise Munroe, MD, have reviewed all documentation for this visit. The documentation on 05/13/22 for the exam, diagnosis, procedures, and orders are all accurate and complete.

## 2022-05-13 NOTE — Patient Instructions (Signed)
Medication Instructions:  No Changes In Medications at this time.  *If you need a refill on your cardiac medications before your next appointment, please call your pharmacy*  Follow-Up: At [facility], you and your health needs are our priority.  As part of our continuing mission to provide you with exceptional heart care, we have created designated Provider Care Teams.  These Care Teams include your primary Cardiologist (physician) and Advanced Practice Providers (APPs -  Physician Assistants and Nurse Practitioners) who all work together to provide you with the care you need, when you need it.  Your next appointment:   1 year(s)  The format for your next appointment:   In Person  Provider:   Gayatri A Acharya, MD          

## 2022-07-17 NOTE — Progress Notes (Unsigned)
[place] [facility] [address] [place], [place] [address] [phone]  Date:  07/20/2022   Name:  [name]   DOB:  [birth year]   MRN:  [id number]  PCP:  Darreld Mclean, MD    Chief Complaint: No chief complaint on file.   History of Present Illness:  [name] is a 51 y.o. very pleasant female patient who presents with the following:  Seen today for follow-up and A1c Last visit with myself was in June History of diabetes, hypothyroidism, obesity, benign ovarian tumor Colon- 2020, recommended recheck in one year - however it looks like she was told by GI she actually is due for recheck next year-patient is aware of this already   Her last A1c was elevated- we increased Trulicty dose to 3 mg  We also did a coronary calcium for her in June, 88th% Referral to cardiology - she was seen in October, they recommend continued secondary prevention   Eye ecam Urine micro Flu shot Foot exam Update A1c    Lab Results  Component Value Date   HGBA1C 10.1 (H) 01/26/2022     Patient Active Problem List   Diagnosis Date Noted   Controlled diabetes mellitus type 2 with complications ([place]) 99/37/1696   History of benign ovarian tumor 05/24/2019   Personal history of colonic polyps    H/O colonoscopy with polypectomy    Acute lower GI bleeding 02/16/2018   Acute blood loss anemia 02/16/2018   Obesity, unspecified 07/07/2013   Hypothyroidism 09/27/2011    Past Medical History:  Diagnosis Date   Anxiety    Benign ovarian tumor 02/2011   17 pound benign tumor- removed   Depression    Diabetes 1.5, managed as type 2 ([place])    Hypertension    Personal history of colonic polyps    Sleep apnea    Thyroid disease    Vitamin D deficiency     Past Surgical History:  Procedure Laterality Date   ABDOMINAL HYSTERECTOMY  2012   one ovary remains- due to giant benign ovarian cyst (17 lbs)   COLONOSCOPY WITH  PROPOFOL N/A 02/16/2018   Procedure: COLONOSCOPY WITH PROPOFOL;  Surgeon: Jerene Bears, MD;  Location: [facility];  Service: Gastroenterology;  Laterality: N/A;   OVARIAN CYST REMOVAL      Social History   Tobacco Use   Smoking status: Never   Smokeless tobacco: Never  Vaping Use   Vaping Use: Never used  Substance Use Topics   Alcohol use: No    Alcohol/week: 0.0 standard drinks of alcohol    Comment: socially   Drug use: No    Family History  Problem Relation Age of Onset   Heart disease Father    Stroke Father    Heart attack Father    Hyperlipidemia Father    Hypertension Father    Colon polyps Father        benign   Heart disease Paternal Grandfather    Heart attack Paternal Grandfather    COPD Mother    Congestive Heart Failure Mother    Lung cancer Mother        highly suspected, waiting on definitive bx 03/14/18   Gallbladder disease Maternal Grandmother    Stroke Paternal Grandmother    Colon cancer Neg Hx    Stomach cancer Neg Hx     Allergies  Allergen Reactions   Levothyroxine Sodium Anxiety and Palpitations   Lisinopril  Cough   Levothyroxine Palpitations    Medication list has been reviewed and updated.  Current Outpatient Medications on File Prior to Visit  Medication Sig Dispense Refill   dicyclomine (BENTYL) 10 MG capsule Take 1 20-30 minutes before breakfast and dinner 60 capsule 3   Dulaglutide (TRULICITY) 3 HK/7.4QV SOPN Inject 3 mg as directed once a week. 2 mL 3   losartan (COZAAR) 25 MG tablet Take 1 tablet (25 mg total) by mouth daily. 90 tablet 3   Melatonin Gummies 2.5 MG CHEW Chew 2.5 mg by mouth at bedtime.      metFORMIN (GLUCOPHAGE) 500 MG tablet TAKE 1 TABLET(500 MG) BY MOUTH TWICE DAILY WITH A MEAL 180 tablet 1   Multiple Vitamins-Minerals (MULTIVITAMIN WITH MINERALS) tablet Take 1 tablet by mouth at bedtime. Reported on 07/24/2015     Rimegepant Sulfate (NURTEC) 75 MG TBDP Take 75 mg by mouth daily as needed. 16 tablet 5    rosuvastatin (CRESTOR) 20 MG tablet Take 1 tablet (20 mg total) by mouth daily. 90 tablet 3   thyroid (NP THYROID) 30 MG tablet TAKE 3 TABLETS (90 mg) BY MOUTH EVERY DAY BEFORE BREAKFAST 270 tablet 1   No current facility-administered medications on file prior to visit.    Review of Systems:  As per HPI- otherwise negative.   Physical Examination: There were no vitals filed for this visit. There were no vitals filed for this visit. There is no height or weight on file to calculate BMI. Ideal Body Weight:    GEN: no acute distress. HEENT: Atraumatic, Normocephalic.  Ears and Nose: No external deformity. CV: RRR, No M/G/R. No JVD. No thrill. No extra heart sounds. PULM: CTA B, no wheezes, crackles, rhonchi. No retractions. No resp. distress. No accessory muscle use. ABD: S, NT, ND, +BS. No rebound. No HSM. EXTR: No c/c/e PSYCH: Normally interactive. Conversant.    Assessment and Plan: ***  Signed Lamar Blinks, MD

## 2022-07-17 NOTE — Patient Instructions (Incomplete)
It was good to see you today -I will be in touch with your labs  Let's stop Trulicity and change over to [facility] 7.5 for now Let's also add Farxiga 10 mg for diabetes control; let me know if this is too expensive!  Take a 1/2 tablet for 3-4 days at first to make sure well tolerated We can go up on Mounjaro as needed and tolerated  Try the ingrown toenail splint to help with nail curvature- let me know what you think of this

## 2022-07-20 ENCOUNTER — Encounter: Payer: Self-pay | Admitting: Family Medicine

## 2022-07-20 ENCOUNTER — Ambulatory Visit: Payer: BC Managed Care – PPO | Admitting: Family Medicine

## 2022-07-20 VITALS — BP 124/84 | HR 78 | Temp 97.7°F | Resp 18 | Ht 64.0 in | Wt 279.4 lb

## 2022-07-20 DIAGNOSIS — E118 Type 2 diabetes mellitus with unspecified complications: Secondary | ICD-10-CM | POA: Diagnosis not present

## 2022-07-20 DIAGNOSIS — E039 Hypothyroidism, unspecified: Secondary | ICD-10-CM

## 2022-07-20 DIAGNOSIS — L723 Sebaceous cyst: Secondary | ICD-10-CM | POA: Diagnosis not present

## 2022-07-20 DIAGNOSIS — Z6841 Body Mass Index (BMI) 40.0 and over, adult: Secondary | ICD-10-CM

## 2022-07-20 LAB — BASIC METABOLIC PANEL
BUN: 13 mg/dL (ref 6–23)
CO2: 27 mEq/L (ref 19–32)
Calcium: 9.5 mg/dL (ref 8.4–10.5)
Chloride: 100 mEq/L (ref 96–112)
Creatinine, Ser: 0.64 mg/dL (ref 0.40–1.20)
GFR: 101.87 mL/min (ref >60.00)
Glucose, Bld: 176 mg/dL — ABNORMAL HIGH (ref 70–99)
Potassium: 4 mEq/L (ref 3.5–5.1)
Sodium: 138 mEq/L (ref 135–145)

## 2022-07-20 LAB — TSH: TSH: 4.83 u[IU]/mL (ref 0.35–5.50)

## 2022-07-20 LAB — HEMOGLOBIN A1C: Hgb A1c MFr Bld: 11.3 % — ABNORMAL HIGH (ref 4.6–6.5)

## 2022-07-20 LAB — MICROALBUMIN / CREATININE URINE RATIO
Creatinine,U: 83 mg/dL
Microalb Creat Ratio: 1.7 mg/g (ref 0.0–30.0)
Microalb, Ur: 1.4 mg/dL (ref 0.0–1.9)

## 2022-07-20 MED ORDER — TIRZEPATIDE 7.5 MG/0.5ML ~~LOC~~ SOAJ
7.5000 mg | SUBCUTANEOUS | 1 refills | Status: DC
  Filled 2022-10-26: qty 2, 28d supply, fill #0

## 2022-07-20 MED ORDER — DAPAGLIFLOZIN PROPANEDIOL 10 MG PO TABS: 10.0000 mg | ORAL_TABLET | Freq: Every day | ORAL | 3 refills | Status: DC

## 2022-07-21 ENCOUNTER — Encounter (INDEPENDENT_AMBULATORY_CARE_PROVIDER_SITE_OTHER): Payer: Self-pay | Admitting: Family Medicine

## 2022-07-21 ENCOUNTER — Ambulatory Visit (INDEPENDENT_AMBULATORY_CARE_PROVIDER_SITE_OTHER): Payer: BC Managed Care – PPO | Admitting: Family Medicine

## 2022-07-21 VITALS — BP 140/83 | HR 72 | Temp 98.4°F | Ht 64.0 in | Wt 276.0 lb

## 2022-07-21 DIAGNOSIS — E1169 Type 2 diabetes mellitus with other specified complication: Secondary | ICD-10-CM | POA: Diagnosis not present

## 2022-07-21 DIAGNOSIS — E669 Obesity, unspecified: Secondary | ICD-10-CM

## 2022-07-21 DIAGNOSIS — I152 Hypertension secondary to endocrine disorders: Secondary | ICD-10-CM

## 2022-07-21 DIAGNOSIS — E1159 Type 2 diabetes mellitus with other circulatory complications: Secondary | ICD-10-CM

## 2022-07-21 DIAGNOSIS — Z7985 Long-term (current) use of injectable non-insulin antidiabetic drugs: Secondary | ICD-10-CM

## 2022-07-21 DIAGNOSIS — Z0289 Encounter for other administrative examinations: Secondary | ICD-10-CM

## 2022-07-21 DIAGNOSIS — Z6841 Body Mass Index (BMI) 40.0 and over, adult: Secondary | ICD-10-CM

## 2022-07-28 NOTE — Progress Notes (Signed)
Office: [phone]  /  Fax: [phone]   Initial Visit  [name] was seen in clinic today to evaluate for obesity. She is interested in losing weight to improve overall health and reduce the risk of weight related complications. She presents today to review program treatment options, initial physical assessment, and evaluation.     She was referred by: PCP  When asked what else they would like to accomplish? She states: Other: n/a  When asked how has your weight affected you? 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  Surgical gained weight since college.  Menopause at 51 years of age.  18 year old mom lives with her.  Not eating healthy foods.  Wife, 52 year old son at home supportive.  Weight promoting medications identified: None  Current nutrition plan: None  Current level of physical activity: Step counting, she used to  enjoy hiking, and walking.  Current or previous pharmacotherapy: None  Response to medication: Never tried medications   Past medical history includes:   Past Medical History:  Diagnosis Date   Anxiety    Benign ovarian tumor 02/2011   17 pound benign tumor- removed   Depression    Diabetes 1.5, managed as type 2 ([place])    Hypertension    Personal history of colonic polyps    Sleep apnea    Thyroid disease    Vitamin D deficiency      Objective:   BP (!) 140/83   Pulse 72   Temp 98.4 F (36.9 C)   Ht '5\' 4"'$  (1.626 m)   Wt 276 lb (125.2 kg)   SpO2 97%   BMI 47.38 kg/m  She was weighed on the bioimpedance scale: Body mass index is 47.38 kg/m.  Visceral Fat Rating: 18, Body Fat%: 51.9  General:  Alert, oriented and cooperative. Patient is in no acute distress.  Respiratory: Normal respiratory effort, no problems with  respiration noted  Extremities: Normal range of motion.    Mental Status: Normal mood and affect. Normal behavior. Normal judgment and thought content.   Assessment and Plan:  1. Type 2 diabetes mellitus with other specified complication, without long-term current use of insulin (HCC) Last A1c was 11.3 yesterday.  She is on metformin 500 mg twice daily, prescription Trulicity changed to Mounjaro 7.5 mg weekly.  She has not started it yet.  She denies any weight loss or A1c improvement with Trulicity.  Continue Mounjaro, metformin per PCP.  Anticipate improvement with weight loss.  2. Hypertension associated with type 2 diabetes mellitus ([place]) Blood pressure borderline high.  She is on losartan 25 mg daily.  She denies chest pain.  She denies any adverse side effects.  Continue losartan 25 mg daily.  Monitor home blood pressure, goal <130/80.  3. Obesity, current BMI 47.4 1) Reviewed information about our program. 2) Reviewed today's Bioimpedance results.   We reviewed weight, biometrics, associated medical conditions and contributing factors with patient. She would benefit from weight loss therapy via a modified calorie, low-carb, high-protein nutritional plan tailored to their REE (resting energy expenditure) which will be determined by indirect calorimetry.  We will also assess for cardiometabolic risk and nutritional derangements via fasting serologies at her next appointment.     Obesity Treatment / Action Plan:  Patient will work on garnering support from family and friends to begin weight loss journey. Will  work on eliminating or reducing the presence of highly palatable, calorie dense foods in the home. Will complete provided nutritional and psychosocial assessment questionnaire before the next appointment. Will be scheduled for indirect calorimetry to determine resting energy expenditure in a fasting state.  This will allow [place] to create a reduced calorie, high-protein meal plan to  promote loss of fat mass while preserving muscle mass.  Obesity Education Performed Today:  She was weighed on the bioimpedance scale and results were discussed and documented in the synopsis.  We discussed obesity as a disease and the importance of a more detailed evaluation of all the factors contributing to the disease.  We discussed the importance of long term lifestyle changes which include nutrition, exercise and behavioral modifications as well as the importance of customizing this to her specific health and social needs.  We discussed the benefits of reaching a healthier weight to alleviate the symptoms of existing conditions and reduce the risks of the biomechanical, metabolic and psychological effects of obesity.  Donalda Job appears to be in the action stage of change and states they are ready to start intensive lifestyle modifications and behavioral modifications.  30 minutes was spent today on this visit including the above counseling, pre-visit chart review, and post-visit documentation.  Reviewed by clinician on day of visit: allergies, medications, problem list, medical history, surgical history, family history, social history, and previous encounter notes.  I, Davy Pique, am acting as Location manager for Loyal Gambler, DO.  I have reviewed the above documentation for accuracy and completeness, and I agree with the above. Dell Ponto, DO

## 2022-08-06 ENCOUNTER — Ambulatory Visit (INDEPENDENT_AMBULATORY_CARE_PROVIDER_SITE_OTHER): Payer: BC Managed Care – PPO | Admitting: Family Medicine

## 2022-08-06 ENCOUNTER — Encounter (INDEPENDENT_AMBULATORY_CARE_PROVIDER_SITE_OTHER): Payer: Self-pay | Admitting: Family Medicine

## 2022-08-06 VITALS — BP 140/85 | HR 79 | Temp 98.1°F | Ht 64.0 in | Wt 275.0 lb

## 2022-08-06 DIAGNOSIS — R5383 Other fatigue: Secondary | ICD-10-CM

## 2022-08-06 DIAGNOSIS — R0602 Shortness of breath: Secondary | ICD-10-CM

## 2022-08-06 DIAGNOSIS — Z7984 Long term (current) use of oral hypoglycemic drugs: Secondary | ICD-10-CM

## 2022-08-06 DIAGNOSIS — G4733 Obstructive sleep apnea (adult) (pediatric): Secondary | ICD-10-CM

## 2022-08-06 DIAGNOSIS — I10 Essential (primary) hypertension: Secondary | ICD-10-CM

## 2022-08-06 DIAGNOSIS — Z7985 Long-term (current) use of injectable non-insulin antidiabetic drugs: Secondary | ICD-10-CM

## 2022-08-06 DIAGNOSIS — E669 Obesity, unspecified: Secondary | ICD-10-CM

## 2022-08-06 DIAGNOSIS — E7849 Other hyperlipidemia: Secondary | ICD-10-CM

## 2022-08-06 DIAGNOSIS — F3289 Other specified depressive episodes: Secondary | ICD-10-CM

## 2022-08-06 DIAGNOSIS — E118 Type 2 diabetes mellitus with unspecified complications: Secondary | ICD-10-CM | POA: Insufficient documentation

## 2022-08-06 DIAGNOSIS — F32A Depression, unspecified: Secondary | ICD-10-CM | POA: Insufficient documentation

## 2022-08-06 DIAGNOSIS — E782 Mixed hyperlipidemia: Secondary | ICD-10-CM | POA: Insufficient documentation

## 2022-08-06 DIAGNOSIS — E1169 Type 2 diabetes mellitus with other specified complication: Secondary | ICD-10-CM

## 2022-08-06 DIAGNOSIS — Z1331 Encounter for screening for depression: Secondary | ICD-10-CM | POA: Insufficient documentation

## 2022-08-06 DIAGNOSIS — Z6841 Body Mass Index (BMI) 40.0 and over, adult: Secondary | ICD-10-CM

## 2022-08-07 LAB — CBC WITH DIFFERENTIAL/PLATELET
Basophils Absolute: 0.1 10*3/uL (ref 0.0–0.2)
Basos: 1 %
EOS (ABSOLUTE): 0.1 10*3/uL (ref 0.0–0.4)
Eos: 1 %
Hematocrit: 42.1 % (ref 34.0–46.6)
Hemoglobin: 14.1 g/dL (ref 11.1–15.9)
Immature Grans (Abs): 0 10*3/uL (ref 0.0–0.1)
Immature Granulocytes: 0 %
Lymphocytes Absolute: 2.4 10*3/uL (ref 0.7–3.1)
Lymphs: 29 %
MCH: 31 pg (ref 26.6–33.0)
MCHC: 33.5 g/dL (ref 31.5–35.7)
MCV: 93 fL (ref 79–97)
Monocytes Absolute: 0.6 10*3/uL (ref 0.1–0.9)
Monocytes: 7 %
Neutrophils Absolute: 5 10*3/uL (ref 1.4–7.0)
Neutrophils: 62 %
Platelets: 307 10*3/uL (ref 150–450)
RBC: 4.55 x10E6/uL (ref 3.77–5.28)
RDW: 12.5 % (ref 11.7–15.4)
WBC: 8.2 10*3/uL (ref 3.4–10.8)

## 2022-08-07 LAB — FOLATE: Folate: 7.7 ng/mL (ref >3.0)

## 2022-08-07 LAB — COMPREHENSIVE METABOLIC PANEL
ALT: 24 IU/L (ref 0–32)
AST: 19 IU/L (ref 0–40)
Albumin/Globulin Ratio: 1.6 (ref 1.2–2.2)
Albumin: 4.4 g/dL (ref 3.8–4.9)
Alkaline Phosphatase: 105 IU/L (ref 44–121)
BUN/Creatinine Ratio: 18 (ref 9–23)
BUN: 10 mg/dL (ref 6–24)
Bilirubin Total: 0.5 mg/dL (ref 0.0–1.2)
CO2: 22 mmol/L (ref 20–29)
Calcium: 9.4 mg/dL (ref 8.7–10.2)
Chloride: 100 mmol/L (ref 96–106)
Creatinine, Ser: 0.57 mg/dL (ref 0.57–1.00)
Globulin, Total: 2.8 g/dL (ref 1.5–4.5)
Glucose: 220 mg/dL — ABNORMAL HIGH (ref 70–99)
Potassium: 4.5 mmol/L (ref 3.5–5.2)
Sodium: 139 mmol/L (ref 134–144)
Total Protein: 7.2 g/dL (ref 6.0–8.5)
eGFR: 109 mL/min/{1.73_m2} (ref >59)

## 2022-08-07 LAB — VITAMIN B12: Vitamin B-12: 384 pg/mL (ref 232–1245)

## 2022-08-07 LAB — INSULIN, RANDOM: INSULIN: 50.6 u[IU]/mL — ABNORMAL HIGH (ref 2.6–24.9)

## 2022-08-07 LAB — VITAMIN D 25 HYDROXY (VIT D DEFICIENCY, FRACTURES): Vit D, 25-Hydroxy: 20.1 ng/mL — ABNORMAL LOW (ref 30.0–100.0)

## 2022-08-20 ENCOUNTER — Encounter (INDEPENDENT_AMBULATORY_CARE_PROVIDER_SITE_OTHER): Payer: Self-pay | Admitting: Family Medicine

## 2022-08-20 ENCOUNTER — Ambulatory Visit (INDEPENDENT_AMBULATORY_CARE_PROVIDER_SITE_OTHER): Payer: BC Managed Care – PPO | Admitting: Family Medicine

## 2022-08-20 VITALS — BP 141/81 | HR 82 | Temp 98.1°F | Ht 64.0 in | Wt 264.0 lb

## 2022-08-20 DIAGNOSIS — I10 Essential (primary) hypertension: Secondary | ICD-10-CM

## 2022-08-20 DIAGNOSIS — E88819 Insulin resistance, unspecified: Secondary | ICD-10-CM | POA: Diagnosis not present

## 2022-08-20 DIAGNOSIS — E559 Vitamin D deficiency, unspecified: Secondary | ICD-10-CM | POA: Diagnosis not present

## 2022-08-20 DIAGNOSIS — E1169 Type 2 diabetes mellitus with other specified complication: Secondary | ICD-10-CM | POA: Diagnosis not present

## 2022-08-20 DIAGNOSIS — Z7985 Long-term (current) use of injectable non-insulin antidiabetic drugs: Secondary | ICD-10-CM

## 2022-08-20 DIAGNOSIS — Z6841 Body Mass Index (BMI) 40.0 and over, adult: Secondary | ICD-10-CM

## 2022-08-20 DIAGNOSIS — E669 Obesity, unspecified: Secondary | ICD-10-CM

## 2022-08-20 MED ORDER — VITAMIN D (ERGOCALCIFEROL) 1.25 MG (50000 UNIT) PO CAPS: 50000.0000 [IU] | ORAL_CAPSULE | ORAL | 0 refills | Status: DC

## 2022-08-22 NOTE — Progress Notes (Signed)
Chief Complaint:   OBESITY [name] (MR# [id number]) is a 52 y.o. female who presents for evaluation and treatment of obesity and related comorbidities. Current BMI is Body mass index is 47.2 kg/m. [name] has been struggling with her weight for many years and has been unsuccessful in either losing weight, maintaining weight loss, or reaching her healthy weight goal.  Patient lives with his wife and 10 year old son and 10 year old mother.  Patient works a sedentary job.  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  Due to caretaking for his mother he has not been able to get to the gym.  Fearful of regaining weight.  [name] is currently in the action stage of change and ready to dedicate time achieving and maintaining a healthier weight. [name] is interested in becoming our patient and working on intensive lifestyle modifications including (but not limited to) diet and exercise for weight loss.  [name]'s habits were reviewed today and are as follows: Her family eats meals together, she thinks her family will eat healthier with her, she struggles with family and or coworkers weight loss sabotage, her desired weight loss is 75 lbs, she has been heavy most of her life, she started gaining weight after the age of 52 years old, her heaviest weight ever was 289 pounds, she has significant food cravings issues, she is frequently drinking liquids with calories, she frequently makes poor food choices, she has problems with excessive hunger, she frequently eats larger portions than normal, and she struggles with emotional eating.  Depression Screen [name]'s Food and Mood (modified PHQ-9) score was 14.  Subjective:   1. Other fatigue [name] admits to daytime somnolence and admits to waking up still tired. Patient has a history of symptoms of daytime fatigue, morning fatigue, and hypertension. [name] generally gets 7 or 8 hours of sleep per night, and  states that she has generally restful sleep but is tired again in 2-3 hours. Snoring is present. Apneic episodes are not present when wearing her CPAP.Marland Kitchen Epworth Sleepiness Score is 9.  EKG on 05/13/2022, NSR, 76 bpm.  2. SOBOE (shortness of breath on exertion) [name] notes increasing shortness of breath with exercising and seems to be worsening over time with weight gain. She notes getting out of breath sooner with activity than she used to. This has not gotten worse recently. [name] denies shortness of breath at rest or orthopnea.  3. Other hyperlipidemia Patient is on rosuvastatin 20 mg daily.  Total cholesterol 120, triglycerides 164, HDL 38, LDL 49.  4. Type 2 diabetes mellitus with other specified complication, without long-term current use of insulin (HCC) Patient is on Farxiga 10 mg daily, Mounjaro 7.5 mg weekly.  Patient is starting Mounjaro 7.5 mg today.  Patient failed to see A1c reduction or weight loss on Trulicity.  Last A1c was 11.3 on 07/20/2022.  5. OSA (obstructive sleep apnea) Patient wears CPAP at night and gets 8 hours of sleep.  6. Essential hypertension Blood pressure elevated > 130/80.  Patient is taking losartan 25 mg daily.  Patient is unsure if she took her meds last night.  7. Other depression with emotional eating Bariatric PHQ 9: 14.  Patient complains of stress eating.  She is not on any mood medications.  She has high stress levels.  Assessment/Plan:   1. Other fatigue [name] does feel that her weight is causing her energy to be lower than it should be. Fatigue may be related to obesity, depression or  many other causes. Labs will be ordered, and in the meanwhile, [name] will focus on self care including making healthy food choices, increasing physical activity and focusing on stress reduction.  Update labs today.  - VITAMIN D 25 Hydroxy (Vit-D Deficiency, Fractures) - Insulin, random - Folate - Comprehensive metabolic panel - Vitamin W54 - CBC with  Differential/Platelet  2. SOBOE (shortness of breath on exertion) [name] does feel that she gets out of breath more easily that she used to when she exercises. [name]'s shortness of breath appears to be obesity related and exercise induced. She has agreed to work on weight loss and gradually increase exercise to treat her exercise induced shortness of breath. Will continue to monitor closely.  3. Other hyperlipidemia Increase exercise frequency to improve HDL levels.  4. Type 2 diabetes mellitus with other specified complication, without long-term current use of insulin ([place]) Look for A1c of progress for weight loss, dietary changes, and new start of Mounjaro.  Update labs today.  - Insulin, random - Comprehensive metabolic panel  5. OSA (obstructive sleep apnea) Continue using nightly CPAP.  6. Essential hypertension Recommend use of a pillbox.  Will repeat blood pressure in 2 weeks.  7. Other depression with emotional eating [name] had a positive depression screening. Depression is commonly associated with obesity and often results in emotional eating behaviors. We will monitor this closely and work on CBT to help improve the non-hunger eating patterns. Referral to Psychology may be required if no improvement is seen as she continues in our clinic.  Consider adding bupropion.  8. Obesity,current BMI  47.3 200 snack calorie handout given.  [name] is currently in the action stage of change and her goal is to continue with weight loss efforts. I recommend [name] begin the structured treatment plan as follows:  She has agreed to the Category 4 Plan.  Exercise goals: Weight training 2-3 times per week.   Behavioral modification strategies: increasing lean protein intake, increasing vegetables, increasing water intake, decreasing eating out, no skipping meals, meal planning and cooking strategies, keeping healthy foods in the home, better snacking choices, and planning for  success.  She was informed of the importance of frequent follow-up visits to maximize her success with intensive lifestyle modifications for her multiple health conditions. She was informed we would discuss her lab results at her next visit unless there is a critical issue that needs to be addressed sooner. [name] agreed to keep her next visit at the agreed upon time to discuss these results.  Objective:   Blood pressure (!) 140/85, pulse 79, temperature 98.1 F (36.7 C), height '5\' 4"'$  (1.626 m), weight 275 lb (124.7 kg), SpO2 94 %. Body mass index is 47.2 kg/m.  EKG: Normal sinus rhythm, rate 76 bpm.  Indirect Calorimeter completed today shows a VO2 of 361 and a REE of 2491.  Her calculated basal metabolic rate is 6270 thus her basal metabolic rate is better than expected.  General: Cooperative, alert, well developed, in no acute distress. HEENT: Conjunctivae and lids unremarkable. Cardiovascular: Regular rhythm.  Lungs: Normal work of breathing. Neurologic: No focal deficits.   Lab Results  Component Value Date   CREATININE 0.57 08/06/2022   BUN 10 08/06/2022   NA 139 08/06/2022   K 4.5 08/06/2022   CL 100 08/06/2022   CO2 22 08/06/2022   Lab Results  Component Value Date   ALT 24 08/06/2022   AST 19 08/06/2022   ALKPHOS 105 08/06/2022   BILITOT 0.5 08/06/2022  Lab Results  Component Value Date   HGBA1C 11.3 (H) 07/20/2022   HGBA1C 10.1 (H) 01/26/2022   HGBA1C 8.8 (H) 09/11/2021   HGBA1C 9.0 (H) 04/24/2021   HGBA1C 10.5 (H) 01/08/2021   Lab Results  Component Value Date   INSULIN 50.6 (H) 08/06/2022   Lab Results  Component Value Date   TSH 4.83 07/20/2022   Lab Results  Component Value Date   CHOL 120 01/26/2022   HDL 38.00 (L) 01/26/2022   LDLCALC 49 01/26/2022   LDLDIRECT 118.0 01/08/2021   TRIG 164.0 (H) 01/26/2022   CHOLHDL 3 01/26/2022   Lab Results  Component Value Date   WBC 8.2 08/06/2022   HGB 14.1 08/06/2022   HCT 42.1 08/06/2022   MCV  93 08/06/2022   PLT 307 08/06/2022   Lab Results  Component Value Date   FERRITIN 51.1 06/15/2016   Attestation Statements:   Reviewed by clinician on day of visit: allergies, medications, problem list, medical history, surgical history, family history, social history, and previous encounter notes.  I have personally spent 40 minutes total time today in preparation, patient care, and documentation for this visit, including the following: review of clinical lab tests; review of medical tests/procedures/services.    I, Davy Pique, am acting as Location manager for Loyal Gambler, DO.  I have reviewed the above documentation for accuracy and completeness, and I agree with the above. Dell Ponto, DO

## 2022-08-26 ENCOUNTER — Encounter: Payer: Self-pay | Admitting: Gastroenterology

## 2022-09-06 ENCOUNTER — Other Ambulatory Visit: Payer: Self-pay | Admitting: Family Medicine

## 2022-09-06 DIAGNOSIS — E039 Hypothyroidism, unspecified: Secondary | ICD-10-CM

## 2022-09-06 DIAGNOSIS — E119 Type 2 diabetes mellitus without complications: Secondary | ICD-10-CM

## 2022-09-10 NOTE — Progress Notes (Signed)
Chief Complaint:   OBESITY [name] is here to discuss her progress with her obesity treatment plan along with follow-up of her obesity related diagnoses. [name] is on the Category 4 Plan and states she is following her eating plan approximately 75% of the time. [name] states she has not been exercising.  Today's visit was #: 2 Starting weight: 275 lbs Starting date: 08/06/22 Today's weight: 264 lbs Today's date: 08/20/22 Total lbs lost to date: 11 Total lbs lost since last in-office visit: -11  Interim History: Meal plan.  Feels adequately full.  Feels satisfied.  Picks a healthy snack after work.  Cooking dinner is more at home.  Wife is supportive.  Mom lives with her-battling lung cancer and COPD.  [name] has helped.  Subjective:   1. Type 2 diabetes mellitus with other specified complication, unspecified whether long term insulin use (HCC) Doing well with change from Trulicity to Mounjaro 7.5 mg weekly-has done 2 injections.  Initially had nausea.  Has improved control over foods.  A.m. glucose running 90s.  Also on metformin 500 mg twice daily.  Last A1c 11.3.  2. Vitamin D deficiency Vitamin D level 20.1.  Complains of fatigue.  Takes a multivitamin some days. Discussed recent lab results.   3. Essential hypertension Blood pressures at home running 130s/80s. Taking losartan 25 mg daily-prescribed by Dr. Margaretann Loveless (cardiology). Denies chest pain or headaches.  4. Insulin resistance Fasting insulin high at 50.6 with poorly controlled type 2 diabetes. Actively working on dietary changes. Discussed recent lab results.   Assessment/Plan:   1. Type 2 diabetes mellitus with other specified complication, unspecified whether long term insulin use ([place]) Continue Mounjaro per PCP.  Continue prescribed diet.  2. Vitamin D deficiency Given prescription vitamin D 50,000 IU weekly, number 5 capsules, no refills. Discussed health consequences of low vitamin D levels.  3.  Essential hypertension Consider increasing dose of losartan if continues to run high.  4. Insulin resistance Ramp up walking time to help with insulin sensitivity.  5. Obesity,current BMI 45.4 1.  Add in weight training at home or gym 2 times per week. 2.  Reviewed [email or web address] for dinner recipes.  [name] is currently in the action stage of change. As such, her goal is to continue with weight loss efforts. She has agreed to the Category 4 Plan and keeping a food journal and adhering to recommended goals of 700-800 calories and 20-30 grams protein at supper.  Exercise goals: All adults should avoid inactivity. Some physical activity is better than none, and adults who participate in any amount of physical activity gain some health benefits.  Behavioral modification strategies: increasing lean protein intake, increasing water intake, decreasing eating out, no skipping meals, meal planning and cooking strategies, keeping healthy foods in the home, and planning for success.  [name] has agreed to follow-up with our clinic in 2-3 weeks. She was informed of the importance of frequent follow-up visits to maximize her success with intensive lifestyle modifications for her multiple health conditions.   Objective:   Blood pressure (!) 141/81, pulse 82, temperature 98.1 F (36.7 C), height 5' 4"$  (1.626 m), weight 264 lb (119.7 kg), SpO2 98 %. Body mass index is 45.32 kg/m.  General: Cooperative, alert, well developed, in no acute distress. HEENT: Conjunctivae and lids unremarkable. Cardiovascular: Regular rhythm.  Lungs: Normal work of breathing. Neurologic: No focal deficits.   Lab Results  Component Value Date   CREATININE 0.57 08/06/2022   BUN 10 08/06/2022  NA 139 08/06/2022   K 4.5 08/06/2022   CL 100 08/06/2022   CO2 22 08/06/2022   Lab Results  Component Value Date   ALT 24 08/06/2022   AST 19 08/06/2022   ALKPHOS 105 08/06/2022   BILITOT 0.5 08/06/2022   Lab Results   Component Value Date   HGBA1C 11.3 (H) 07/20/2022   HGBA1C 10.1 (H) 01/26/2022   HGBA1C 8.8 (H) 09/11/2021   HGBA1C 9.0 (H) 04/24/2021   HGBA1C 10.5 (H) 01/08/2021   Lab Results  Component Value Date   INSULIN 50.6 (H) 08/06/2022   Lab Results  Component Value Date   TSH 4.83 07/20/2022   Lab Results  Component Value Date   CHOL 120 01/26/2022   HDL 38.00 (L) 01/26/2022   LDLCALC 49 01/26/2022   LDLDIRECT 118.0 01/08/2021   TRIG 164.0 (H) 01/26/2022   CHOLHDL 3 01/26/2022   Lab Results  Component Value Date   VD25OH 20.1 (L) 08/06/2022   VD25OH 18.31 (L) 04/24/2021   VD25OH 14.89 (L) 06/07/2017   Lab Results  Component Value Date   WBC 8.2 08/06/2022   HGB 14.1 08/06/2022   HCT 42.1 08/06/2022   MCV 93 08/06/2022   PLT 307 08/06/2022   Lab Results  Component Value Date   FERRITIN 51.1 06/15/2016    Attestation Statements:   Reviewed by clinician on day of visit: allergies, medications, problem list, medical history, surgical history, family history, social history, and previous encounter notes.  I have personally spent 30  minutes total time today in preparation, patient care, and documentation for this visit, including the following: review of clinical lab tests; review of medical tests/procedures/services.    I, Georgianne Fick, FNP, am acting as transcriptionist for Dr. Loyal Gambler.   I have reviewed the above documentation for accuracy and completeness, and I agree with the above. Dell Ponto, DO

## 2022-09-17 ENCOUNTER — Ambulatory Visit (INDEPENDENT_AMBULATORY_CARE_PROVIDER_SITE_OTHER): Payer: BC Managed Care – PPO | Admitting: Adult Health

## 2022-09-17 ENCOUNTER — Ambulatory Visit (AMBULATORY_SURGERY_CENTER): Payer: BC Managed Care – PPO | Admitting: *Deleted

## 2022-09-17 ENCOUNTER — Encounter (INDEPENDENT_AMBULATORY_CARE_PROVIDER_SITE_OTHER): Payer: Self-pay | Admitting: Adult Health

## 2022-09-17 VITALS — BP 134/88 | HR 68 | Temp 98.1°F | Ht 64.0 in | Wt 255.0 lb

## 2022-09-17 VITALS — Ht 64.0 in | Wt 259.0 lb

## 2022-09-17 DIAGNOSIS — Z7985 Long-term (current) use of injectable non-insulin antidiabetic drugs: Secondary | ICD-10-CM

## 2022-09-17 DIAGNOSIS — Z6841 Body Mass Index (BMI) 40.0 and over, adult: Secondary | ICD-10-CM

## 2022-09-17 DIAGNOSIS — I152 Hypertension secondary to endocrine disorders: Secondary | ICD-10-CM | POA: Diagnosis not present

## 2022-09-17 DIAGNOSIS — E1169 Type 2 diabetes mellitus with other specified complication: Secondary | ICD-10-CM

## 2022-09-17 DIAGNOSIS — E1159 Type 2 diabetes mellitus with other circulatory complications: Secondary | ICD-10-CM

## 2022-09-17 DIAGNOSIS — E669 Obesity, unspecified: Secondary | ICD-10-CM

## 2022-09-17 DIAGNOSIS — Z7984 Long term (current) use of oral hypoglycemic drugs: Secondary | ICD-10-CM

## 2022-09-17 DIAGNOSIS — Z86010 Personal history of colonic polyps: Secondary | ICD-10-CM

## 2022-09-17 MED ORDER — NA SULFATE-K SULFATE-MG SULF 17.5-3.13-1.6 GM/177ML PO SOLN: 1.0000 | Freq: Once | ORAL | 0 refills | Status: AC

## 2022-09-17 NOTE — Progress Notes (Signed)
Chief Complaint:   OBESITY Sweet is here to discuss her progress with her obesity treatment plan along with follow-up of her obesity related diagnoses. Henzley is on the Category 4 Plan and states she is following her eating plan approximately 90% of the time.  Neela states she is walking 20 minutes 3 times per week.  Today's visit was #: 3 Starting weight: 276 lbs Starting date: 07/21/2022 Today's weight: 255 lbs Today's date: 09/17/2022 Total lbs lost to date: 20 lbs Total lbs lost since last in-office visit: - 9 lbs  Interim History:  When asked what is a typical day of eating, Ms Klutz responded "I eat what's on the list"- perfect!  She endorses feeling better and able to perform housework easier.  She reports a noticeable decrease in polydipsia sx's.  Subjective:   1. Hypertension associated with type 2 diabetes mellitus (HCC) BP much improved today! She is currently Cozaar 30m QD. She denies CP or dyspnea with walking.  2. Type 2 diabetes mellitus with other specified complication, unspecified whether long term insulin use (HCC) Lab Results  Component Value Date   HGBA1C 11.3 (H) 07/20/2022   HGBA1C 10.1 (H) 01/26/2022   HGBA1C 8.8 (H) 09/11/2021   PCP manages antidiabetic medications- Metformin 5055mBID with meals. 07/20/22-Trulicity 54m22mecently replaced with Mounjaro 7.5mg52mce weekly injection. Farxiga 10mg5m also added. She reports fasting CBG have greatly improved- upper 80s. She denies sx's of hypoglycemia.  Assessment/Plan:   1. Hypertension associated with type 2 diabetes mellitus (HCC) [place] weight [place] efforts and daily Cozaar 25mg.50m Type 2 diabetes mellitus with other specified complication, unspecified whether long term insulin use (HCC) Continue Metformin 500mg B60mFarxigo 10mg QD65md Mounjaro 7.5mg once55mek per PCP.  3. Obesity,current BMI 43.9  Elleen is currently in the action stage of change. As such, her goal  is to continue with weight loss efforts. She has agreed to the Category 4 Plan.   Handouts- Additional Breakfast Options, Snack Sheets  Exercise goals: All adults should avoid inactivity. Some physical activity is better than none, and adults who participate in any amount of physical activity gain some health benefits.  Behavioral modification strategies: increasing lean protein intake, decreasing simple carbohydrates, increasing vegetables, increasing water intake, meal planning and cooking strategies, and planning for success.  Lachrisha Tessieed to follow-up with our clinic in 2 weeks. She was informed of the importance of frequent follow-up visits to maximize her success with intensive lifestyle modifications for her multiple health conditions.    Objective:   Blood pressure 134/88, pulse 68, temperature 98.1 F (36.7 C), height 5' 4"$  (1.626 m), weight 255 lb (115.7 kg), SpO2 100 %. Body mass index is 43.77 kg/m.  General: Cooperative, alert, well developed, in no acute distress. HEENT: Conjunctivae and lids unremarkable. Cardiovascular: Regular rhythm.  Lungs: Normal work of breathing. Neurologic: No focal deficits.   Lab Results  Component Value Date   CREATININE 0.57 08/06/2022   BUN 10 08/06/2022   NA 139 08/06/2022   K 4.5 08/06/2022   CL 100 08/06/2022   CO2 22 08/06/2022   Lab Results  Component Value Date   ALT 24 08/06/2022   AST 19 08/06/2022   ALKPHOS 105 08/06/2022   BILITOT 0.5 08/06/2022   Lab Results  Component Value Date   HGBA1C 11.3 (H) 07/20/2022   HGBA1C 10.1 (H) 01/26/2022   HGBA1C 8.8 (H) 09/11/2021   HGBA1C 9.0 (H) 04/24/2021   HGBA1C  10.5 (H) 01/08/2021   Lab Results  Component Value Date   INSULIN 50.6 (H) 08/06/2022   Lab Results  Component Value Date   TSH 4.83 07/20/2022   Lab Results  Component Value Date   CHOL 120 01/26/2022   HDL 38.00 (L) 01/26/2022   LDLCALC 49 01/26/2022   LDLDIRECT 118.0 01/08/2021   TRIG 164.0 (H)  01/26/2022   CHOLHDL 3 01/26/2022   Lab Results  Component Value Date   VD25OH 20.1 (L) 08/06/2022   VD25OH 18.31 (L) 04/24/2021   VD25OH 14.89 (L) 06/07/2017   Lab Results  Component Value Date   WBC 8.2 08/06/2022   HGB 14.1 08/06/2022   HCT 42.1 08/06/2022   MCV 93 08/06/2022   PLT 307 08/06/2022   Lab Results  Component Value Date   FERRITIN 51.1 06/15/2016   Attestation Statements:   Reviewed by clinician on day of visit: allergies, medications, problem list, medical history, surgical history, family history, social history, and previous encounter notes.   I have reviewed the above documentation for accuracy and completeness, and I agree with the above. -  Eldena Dede d. Tudor Chandley, NP-C

## 2022-09-17 NOTE — Progress Notes (Signed)
No egg or soy allergy known to patient  No issues known to pt with past sedation with any surgeries or procedures Patient denies ever being told they had issues or difficulty with intubation  No FH of Malignant Hyperthermia Pt is not on diet pills Pt is not on  home 02  Pt is not on blood thinners  Pt denies issues with constipation  Pt is not on dialysis Pt denies any upcoming cardiac testing Pt encouraged to use to use Singlecare or Goodrx to reduce cost  Patient's chart reviewed by Osvaldo Angst CNRA prior to previsit and patient appropriate for the [facility].  Previsit completed and red dot placed by patient's name on their procedure day (on provider's schedule).  . Visit in person Instructions reviewed with pt Pt states she understands them Instructions given to pt with coupon

## 2022-09-25 ENCOUNTER — Encounter: Payer: Self-pay | Admitting: Gastroenterology

## 2022-10-04 ENCOUNTER — Other Ambulatory Visit (INDEPENDENT_AMBULATORY_CARE_PROVIDER_SITE_OTHER): Payer: Self-pay | Admitting: Family Medicine

## 2022-10-04 DIAGNOSIS — E559 Vitamin D deficiency, unspecified: Secondary | ICD-10-CM

## 2022-10-08 ENCOUNTER — Ambulatory Visit (INDEPENDENT_AMBULATORY_CARE_PROVIDER_SITE_OTHER): Payer: BC Managed Care – PPO | Admitting: Family Medicine

## 2022-10-08 ENCOUNTER — Encounter (INDEPENDENT_AMBULATORY_CARE_PROVIDER_SITE_OTHER): Payer: Self-pay | Admitting: Family Medicine

## 2022-10-08 VITALS — BP 123/82 | HR 74 | Temp 97.9°F | Ht 64.0 in | Wt 249.0 lb

## 2022-10-08 DIAGNOSIS — E1169 Type 2 diabetes mellitus with other specified complication: Secondary | ICD-10-CM

## 2022-10-08 DIAGNOSIS — Z7985 Long-term (current) use of injectable non-insulin antidiabetic drugs: Secondary | ICD-10-CM

## 2022-10-08 DIAGNOSIS — E559 Vitamin D deficiency, unspecified: Secondary | ICD-10-CM

## 2022-10-08 DIAGNOSIS — Z636 Dependent relative needing care at home: Secondary | ICD-10-CM

## 2022-10-08 DIAGNOSIS — Z6841 Body Mass Index (BMI) 40.0 and over, adult: Secondary | ICD-10-CM

## 2022-10-08 DIAGNOSIS — Z7984 Long term (current) use of oral hypoglycemic drugs: Secondary | ICD-10-CM

## 2022-10-08 MED ORDER — VITAMIN D (ERGOCALCIFEROL) 1.25 MG (50000 UNIT) PO CAPS: 50000.0000 [IU] | ORAL_CAPSULE | ORAL | 0 refills | Status: DC

## 2022-10-08 NOTE — Assessment & Plan Note (Addendum)
Last vitamin D Lab Results  Component Value Date   VD25OH 20.1 (L) 08/06/2022   Taking RX vitamin D 50,000 IU Weekly.  Energy level improving.  Recheck level in 2 mos.

## 2022-10-08 NOTE — Assessment & Plan Note (Signed)
Improving She has a net weight loss of 26 lb in 10 weeks of medically supervised weight management.  This is a 9.4% TBW loss. Doing well retaining most of her muscle mass.   Continue prescribed meal plan. Plan to ramp up exercise to 3 x a week.

## 2022-10-08 NOTE — Assessment & Plan Note (Signed)
Doing well on Mounjaro 7.5 mg weekly + Faxriga 10 mg daily + metformin 500 mg bid.  Denies low blood sugars.  Occasion low sugars.  Denies meal skipping and is making better food choices.  Denies nausea or vomiting.  Denies acid reflux.    Lab Results  Component Value Date   HGBA1C 11.3 (H) 07/20/2022   Has f/u with PCP to recheck A1c.  Continue current meds and prescribed meal plan.

## 2022-10-08 NOTE — Progress Notes (Signed)
Office: [phone]  /  Fax: [phone]  WEIGHT SUMMARY AND BIOMETRICS  Vitals Temp: 97.9 F (36.6 C) BP: 123/82 Pulse Rate: 74 SpO2: 99 %   Anthropometric Measurements Height: '5\' 4"'$  (1.626 m) Weight: 249 lb (112.9 kg) BMI (Calculated): 42.72 Weight at Last Visit: 255lb Weight Lost Since Last Visit: 6lb Starting Weight: 276lb Total Weight Loss (lbs): 26 lb (11.8 kg)   Body Composition  Body Fat %: 49.4 % Fat Mass (lbs): 123.4 lbs Muscle Mass (lbs): 120 lbs Total Body Water (lbs): 92.2 lbs Visceral Fat Rating : 16   Other Clinical Data Fasting: yes Labs: no Today's Visit #: 4 Starting Date: 07/21/22     HPI  Chief Complaint: OBESITY  [name] is here to discuss her progress with her obesity treatment plan. She is on the the Category 4 Plan and states she is following her eating plan approximately 90 % of the time. She states she is exercising 20 minutes 3 times per week.   Interval History:  Since last office visit she is down 6 lb Down 2.8 lb of muscle mass, down 2.8 lb of body fat since last visit She has good control over foods/ food volumes on Mounjaro 7.5 mg weekly + Faxiga 10 mg daily (prescribed by PCP for T2DM) She is doing a little walking She is craving diet soda Her mom has been in the hospital but she was choosing [employer] and she brought in a cooler with yogurts.   Knee pain has been limiting her walking time  Eating out guide given  Pharmacotherapy: Mounjaro 7.5 mg weekly (PCP for T2DM)  PHYSICAL EXAM:  Blood pressure 123/82, pulse 74, temperature 97.9 F (36.6 C), height '5\' 4"'$  (1.626 m), weight 249 lb (112.9 kg), SpO2 99 %. Body mass index is 42.74 kg/m.  General: She is overweight, cooperative, alert, well developed, and in no acute distress. PSYCH: Has normal mood, affect and thought process.   Lungs: Normal breathing effort, no conversational dyspnea.  DIAGNOSTIC DATA REVIEWED:  BMET    Component Value Date/Time   NA 139  08/06/2022 0855   K 4.5 08/06/2022 0855   CL 100 08/06/2022 0855   CO2 22 08/06/2022 0855   GLUCOSE 220 (H) 08/06/2022 0855   GLUCOSE 176 (H) 07/20/2022 1334   BUN 10 08/06/2022 0855   CREATININE 0.57 08/06/2022 0855   CREATININE 0.60 05/26/2019 1616   CALCIUM 9.4 08/06/2022 0855   GFRNONAA >60 08/29/2019 1150   GFRNONAA 108 05/26/2019 1616   GFRAA >60 08/29/2019 1150   GFRAA 125 05/26/2019 1616   Lab Results  Component Value Date   HGBA1C 11.3 (H) 07/20/2022   HGBA1C 5.6 07/24/2015   Lab Results  Component Value Date   INSULIN 50.6 (H) 08/06/2022   Lab Results  Component Value Date   TSH 4.83 07/20/2022   CBC    Component Value Date/Time   WBC 8.2 08/06/2022 0855   WBC 7.4 01/26/2022 0914   RBC 4.55 08/06/2022 0855   RBC 4.38 01/26/2022 0914   HGB 14.1 08/06/2022 0855   HCT 42.1 08/06/2022 0855   PLT 307 08/06/2022 0855   MCV 93 08/06/2022 0855   MCH 31.0 08/06/2022 0855   MCH 31.1 08/29/2019 1150   MCHC 33.5 08/06/2022 0855   MCHC 33.9 01/26/2022 0914   RDW 12.5 08/06/2022 0855   Iron Studies    Component Value Date/Time   FERRITIN 51.1 06/15/2016 1218   Lipid Panel     Component Value Date/Time  CHOL 120 01/26/2022 0914   CHOL 153 02/06/2020 0947   TRIG 164.0 (H) 01/26/2022 0914   HDL 38.00 (L) 01/26/2022 0914   HDL 36 (L) 02/06/2020 0947   CHOLHDL 3 01/26/2022 0914   VLDL 32.8 01/26/2022 0914   LDLCALC 49 01/26/2022 0914   LDLCALC 88 02/06/2020 0947   LDLDIRECT 118.0 01/08/2021 1438   Hepatic Function Panel     Component Value Date/Time   PROT 7.2 08/06/2022 0855   ALBUMIN 4.4 08/06/2022 0855   AST 19 08/06/2022 0855   ALT 24 08/06/2022 0855   ALKPHOS 105 08/06/2022 0855   BILITOT 0.5 08/06/2022 0855   BILIDIR 0.10 02/06/2020 0947      Component Value Date/Time   TSH 4.83 07/20/2022 1334   Nutritional Lab Results  Component Value Date   VD25OH 20.1 (L) 08/06/2022   VD25OH 18.31 (L) 04/24/2021   VD25OH 14.89 (L) 06/07/2017      ASSESSMENT AND PLAN  TREATMENT PLAN FOR OBESITY:  Recommended Dietary Goals  [name] is currently in the action stage of change. As such, her goal is to continue weight management plan. She has agreed to the Category 4 Plan.  Behavioral Intervention  We discussed the following Behavioral Modification Strategies today: increasing lean protein intake, increasing vegetables, increasing water intake, work on meal planning and easy cooking plans, reading food labels , and work on managing stress, creating time for self-care and relaxation measures.  Additional resources provided today: NA  Recommended Physical Activity Goals  [name] has been advised to work up to 150 minutes of moderate intensity aerobic activity a week and strengthening exercises 2-3 times per week for cardiovascular health, weight loss maintenance and preservation of muscle mass.   She has agreed to Patient also encouraged on scheduling and tracking physical activity.    Pharmacotherapy We discussed various medication options to help [name] with her weight loss efforts and we both agreed to none.  ASSOCIATED CONDITIONS ADDRESSED TODAY  Type 2 diabetes mellitus with other specified complication, unspecified whether long term insulin use (HCC) Assessment & Plan: Doing well on Mounjaro 7.5 mg weekly + Faxriga 10 mg daily + metformin 500 mg bid.  Denies low blood sugars.  Occasion low sugars.  Denies meal skipping and is making better food choices.  Denies nausea or vomiting.  Denies acid reflux.    Lab Results  Component Value Date   HGBA1C 11.3 (H) 07/20/2022   Has f/u with PCP to recheck A1c.  Continue current meds and prescribed meal plan.   Morbid obesity ([place]) Assessment & Plan: Improving She has a net weight loss of 26 lb in 10 weeks of medically supervised weight management.  This is a 9.4% TBW loss. Doing well retaining most of her muscle mass.   Continue prescribed meal plan. Plan to ramp  up exercise to 3 x a week.   BMI 40.0-44.9, adult (HCC)  Vitamin D deficiency Assessment & Plan: Last vitamin D Lab Results  Component Value Date   VD25OH 20.1 (L) 08/06/2022   Taking RX vitamin D 50,000 IU Weekly.  Energy level improving.  Recheck level in 2 mos.    Orders: -     Vitamin D (Ergocalciferol); Take 1 capsule (50,000 Units total) by mouth every 7 (seven) days.  Dispense: 5 capsule; Refill: 0  Caregiver stress Assessment & Plan: Her mom remains in the hospital with lung cancer and COPD.  She is managing stressors well and has a good support system.  Has been control  over stress eating.    We discussed the importance of getting good sleep at night, some outdoor walking for stress reduction and putting healthy foods into body on a schedule.         No follow-ups on file.Marland Kitchen She was informed of the importance of frequent follow up visits to maximize her success with intensive lifestyle modifications for her multiple health conditions.   ATTESTASTION STATEMENTS:  Reviewed by clinician on day of visit: allergies, medications, problem list, medical history, surgical history, family history, social history, and previous encounter notes pertinent to obesity diagnosis.   I have personally spent 30 minutes total time today in preparation, patient care, nutritional counseling and documentation for this visit, including the following: review of clinical lab tests; review of medical tests/procedures/services.      Dell Ponto, DO DABFM, DABOM [facility] [address] [place], [place] [address] [phone]

## 2022-10-08 NOTE — Assessment & Plan Note (Signed)
Her mom remains in the hospital with lung cancer and COPD.  She is managing stressors well and has a good support system.  Has been control over stress eating.    We discussed the importance of getting good sleep at night, some outdoor walking for stress reduction and putting healthy foods into body on a schedule.

## 2022-10-09 ENCOUNTER — Ambulatory Visit (AMBULATORY_SURGERY_CENTER): Payer: BC Managed Care – PPO | Admitting: Gastroenterology

## 2022-10-09 ENCOUNTER — Encounter: Payer: Self-pay | Admitting: Gastroenterology

## 2022-10-09 VITALS — BP 110/64 | HR 80 | Temp 98.0°F | Resp 19 | Ht 64.0 in | Wt 259.0 lb

## 2022-10-09 DIAGNOSIS — Z86010 Personal history of colonic polyps: Secondary | ICD-10-CM

## 2022-10-09 DIAGNOSIS — D120 Benign neoplasm of cecum: Secondary | ICD-10-CM

## 2022-10-09 DIAGNOSIS — Z09 Encounter for follow-up examination after completed treatment for conditions other than malignant neoplasm: Secondary | ICD-10-CM | POA: Diagnosis not present

## 2022-10-09 DIAGNOSIS — K5140 Inflammatory polyps of colon without complications: Secondary | ICD-10-CM | POA: Diagnosis not present

## 2022-10-09 DIAGNOSIS — Z1211 Encounter for screening for malignant neoplasm of colon: Secondary | ICD-10-CM | POA: Diagnosis not present

## 2022-10-09 MED ORDER — SODIUM CHLORIDE 0.9 % IV SOLN: 500.0000 mL | Freq: Once | INTRAVENOUS | Status: DC

## 2022-10-09 NOTE — Progress Notes (Signed)
[facility] History and Physical   Primary Care Physician:  Copland, Gay Filler, MD   Reason for Procedure:  History of adenomatous colon polyps  Plan:    Surveillance colonoscopy with possible interventions as needed     HPI: [name] is a very pleasant 52 y.o. female here for surveillance colonoscopy. Denies any nausea, vomiting, abdominal pain, melena or bright red blood per rectum  The risks and benefits as well as alternatives of endoscopic procedure(s) have been discussed and reviewed. All questions answered. The patient agrees to proceed.    Past Medical History:  Diagnosis Date   Anxiety    Benign ovarian tumor 02/2011   17 pound benign tumor- removed   Depression    Diabetes 1.5, managed as type 2 ([place])    Fatty liver    High cholesterol    Hypertension    Hypothyroidism    Personal history of colonic polyps    Skin cancer    Sleep apnea    Thyroid disease    Vitamin D deficiency     Past Surgical History:  Procedure Laterality Date   ABDOMINAL HYSTERECTOMY  2012   one ovary remains- due to giant benign ovarian cyst (17 lbs)   COLONOSCOPY WITH PROPOFOL N/A 02/16/2018   Procedure: COLONOSCOPY WITH PROPOFOL;  Surgeon: Jerene Bears, MD;  Location: [facility];  Service: Gastroenterology;  Laterality: N/A;   OVARIAN CYST REMOVAL      Prior to Admission medications   Medication Sig Start Date End Date Taking? Authorizing Provider  dapagliflozin propanediol (FARXIGA) 10 MG TABS tablet Take 1 tablet (10 mg total) by mouth daily before breakfast. 07/20/22  Yes Copland, Gay Filler, MD  losartan (COZAAR) 25 MG tablet Take 1 tablet (25 mg total) by mouth daily. 05/13/22  Yes Elouise Munroe, MD  Melatonin Gummies 2.5 MG CHEW Chew 2.5 mg by mouth at bedtime.    Yes [provider]  metFORMIN (GLUCOPHAGE) 500 MG tablet TAKE 1 TABLET(500 MG) BY MOUTH TWICE DAILY WITH A MEAL 09/07/22  Yes Copland, Gay Filler, MD  Multiple Vitamins-Minerals  (MULTIVITAMIN WITH MINERALS) tablet Take 1 tablet by mouth at bedtime. Reported on 07/24/2015   Yes [provider]  rosuvastatin (CRESTOR) 20 MG tablet Take 1 tablet (20 mg total) by mouth daily. 05/13/22  Yes Elouise Munroe, MD  thyroid (NP THYROID) 30 MG tablet TAKE 3 TABLETS BY MOUTH EVERY DAY BEFORE BREAKFAST 09/07/22  Yes Copland, Gay Filler, MD  tirzepatide [facility]) 7.5 MG/0.5ML Pen Inject 7.5 mg into the skin once a week. 07/20/22  Yes Copland, Gay Filler, MD  dicyclomine (BENTYL) 10 MG capsule Take 1 20-30 minutes before breakfast and dinner 08/15/21   Levin Erp, PA  Rimegepant Sulfate (NURTEC) 75 MG TBDP Take 75 mg by mouth daily as needed. 05/01/21   Pieter Partridge, DO  Vitamin D, Ergocalciferol, (DRISDOL) 1.25 MG (50000 UNIT) CAPS capsule Take 1 capsule (50,000 Units total) by mouth every 7 (seven) days. 10/08/22   Bowen, Collene Leyden, DO    Current Outpatient Medications  Medication Sig Dispense Refill   dapagliflozin propanediol (FARXIGA) 10 MG TABS tablet Take 1 tablet (10 mg total) by mouth daily before breakfast. 90 tablet 3   losartan (COZAAR) 25 MG tablet Take 1 tablet (25 mg total) by mouth daily. 90 tablet 3   Melatonin Gummies 2.5 MG CHEW Chew 2.5 mg by mouth at bedtime.      metFORMIN (GLUCOPHAGE) 500 MG tablet TAKE 1 TABLET(500  MG) BY MOUTH TWICE DAILY WITH A MEAL 180 tablet 1   Multiple Vitamins-Minerals (MULTIVITAMIN WITH MINERALS) tablet Take 1 tablet by mouth at bedtime. Reported on 07/24/2015     rosuvastatin (CRESTOR) 20 MG tablet Take 1 tablet (20 mg total) by mouth daily. 90 tablet 3   thyroid (NP THYROID) 30 MG tablet TAKE 3 TABLETS BY MOUTH EVERY DAY BEFORE BREAKFAST 270 tablet 1   tirzepatide (MOUNJARO) 7.5 MG/0.5ML Pen Inject 7.5 mg into the skin once a week. 2 mL 3   dicyclomine (BENTYL) 10 MG capsule Take 1 20-30 minutes before breakfast and dinner 60 capsule 3   Rimegepant Sulfate (NURTEC) 75 MG TBDP Take 75 mg by mouth daily as needed. 16  tablet 5   Vitamin D, Ergocalciferol, (DRISDOL) 1.25 MG (50000 UNIT) CAPS capsule Take 1 capsule (50,000 Units total) by mouth every 7 (seven) days. 5 capsule 0   Current Facility-Administered Medications  Medication Dose Route Frequency Provider Last Rate Last Admin   0.9 %  sodium chloride infusion  500 mL Intravenous Once Mauri Pole, MD        Allergies as of 10/09/2022 - Review Complete 10/09/2022  Allergen Reaction Noted   Levothyroxine sodium Anxiety and Palpitations 09/27/2011   Lisinopril Cough 01/09/2019   Levothyroxine Palpitations 01/20/2018    Family History  Problem Relation Age of Onset   Cancer Mother    COPD Mother    Congestive Heart Failure Mother    Lung cancer Mother        highly suspected, waiting on definitive bx 03/14/18   Thyroid disease Father    Heart disease Father    Stroke Father    Heart attack Father    Hyperlipidemia Father    Hypertension Father    Colon polyps Father        benign   Obesity Father    Gallbladder disease Maternal Grandmother    Stroke Paternal Grandmother    Heart disease Paternal Grandfather    Heart attack Paternal Grandfather    Colon cancer Neg Hx    Stomach cancer Neg Hx    Esophageal cancer Neg Hx    Rectal cancer Neg Hx     Social History   Socioeconomic History   Marital status: Married    Spouse name: Helene Kelp   Number of children: 1   Years of education: Not on file   Highest education level: Bachelor's degree (e.g., BA, AB, BS)  Occupational History   Occupation: Mental Health Professional  Tobacco Use   Smoking status: Never   Smokeless tobacco: Never  Vaping Use   Vaping Use: Never used  Substance and Sexual Activity   Alcohol use: No    Alcohol/week: 0.0 standard drinks of alcohol    Comment: socially   Drug use: No   Sexual activity: Yes  Other Topics Concern   Not on file  Social History Narrative   Significant other   Education: College   Exercise: Yes      Right handed    One story home    Lives with spouse   Social Determinants of Health   Financial Resource Strain: Not on file  Food Insecurity: Not on file  Transportation Needs: Not on file  Physical Activity: Not on file  Stress: Not on file  Social Connections: Not on file  Intimate Partner Violence: Not on file    Review of Systems:  All other review of systems negative except as mentioned in the HPI.  Physical Exam: Vital signs in last 24 hours: Blood Pressure 135/83   Pulse 78   Temperature 98 F (36.7 C) (Temporal)   Height '5\' 4"'$  (1.626 m)   Weight 259 lb (117.5 kg)   Oxygen Saturation 98%   Body Mass Index 44.46 kg/m  General:   Alert, NAD Lungs:  Clear .   Heart:  Regular rate and rhythm Abdomen:  Soft, nontender and nondistended. Neuro/Psych:  Alert and cooperative. Normal mood and affect. A and O x 3  Reviewed labs, radiology imaging, old records and pertinent past GI work up  Patient is appropriate for planned procedure(s) and anesthesia in an ambulatory setting   K. Denzil Magnuson , MD [phone]

## 2022-10-09 NOTE — Progress Notes (Signed)
Pt's states no medical or surgical changes since previsit or office visit. 

## 2022-10-09 NOTE — Progress Notes (Signed)
Report to PACU, RN, vss, BBS= Clear.  

## 2022-10-09 NOTE — Progress Notes (Signed)
Called to room to assist during endoscopic procedure.  Patient ID and intended procedure confirmed with present staff. Received instructions for my participation in the procedure from the performing physician.  

## 2022-10-09 NOTE — Op Note (Addendum)
[facility] Patient Name: [name] Procedure Date: 10/09/2022 12:50 PM MRN: [id number] Endoscopist: Mauri Pole , MD, [id number] Age: 52 Referring MD:  Date of Birth: Jun 05, 1971 Gender: Female Account #: [id number] Procedure:                Colonoscopy Indications:              High risk colon cancer surveillance: Personal                            history of colonic polyps, High risk colon cancer                            surveillance: Personal history of adenoma less than                            10 mm in size Medicines:                Monitored Anesthesia Care Procedure:                Pre-Anesthesia Assessment:                           - Prior to the procedure, a History and Physical                            was performed, and patient medications and                            allergies were reviewed. The patient's tolerance of                            previous anesthesia was also reviewed. The risks                            and benefits of the procedure and the sedation                            options and risks were discussed with the patient.                            All questions were answered, and informed consent                            was obtained. Prior Anticoagulants: The patient has                            taken no anticoagulant or antiplatelet agents. ASA                            Grade Assessment: III - A patient with severe                            systemic disease. After reviewing the risks and  benefits, the patient was deemed in satisfactory                            condition to undergo the procedure.                           After obtaining informed consent, the colonoscope                            was passed under direct vision. Throughout the                            procedure, the patient's blood pressure, pulse, and                            oxygen saturations were monitored  continuously. The                            Olympus [id number]) Colonoscope was                            introduced through the anus and advanced to the the                            cecum, identified by appendiceal orifice and                            ileocecal valve. The colonoscopy was performed                            without difficulty. The patient tolerated the                            procedure well. The quality of the bowel                            preparation was good. The ileocecal valve,                            appendiceal orifice, and rectum were photographed. Scope In: 1:27:23 PM Scope Out: 1:56:59 PM Scope Withdrawal Time: 0 hours 9 minutes 2 seconds  Total Procedure Duration: 0 hours 29 minutes 36 seconds  Findings:                 The perianal and digital rectal examinations were                            normal.                           A 10 mm polyp was found in the cecum. The polyp was                            sessile. The polyp was removed with a cold snare.  Resection and retrieval were complete.                           Non-bleeding internal hemorrhoids were found during                            retroflexion. The hemorrhoids were small. Complications:            No immediate complications. Estimated Blood Loss:     Estimated blood loss was minimal. Impression:               - One 10 mm polyp in the cecum, removed with a cold                            snare. Resected and retrieved.                           - Non-bleeding internal hemorrhoids.                           - The GI Genius (intelligent endoscopy module),                            computer-aided polyp detection system powered by AI                            was utilized to detect colorectal polyps through                            enhanced visualization during colonoscopy. Recommendation:           - Patient has a contact number available for                             emergencies. The signs and symptoms of potential                            delayed complications were discussed with the                            patient. Return to normal activities tomorrow.                            Written discharge instructions were provided to the                            patient.                           - Resume previous diet.                           - Continue present medications.                           - Await pathology results.                           -  Repeat colonoscopy in 3 - 5 years for                            surveillance based on pathology results. Mauri Pole, MD 10/09/2022 2:03:12 PM This report has been signed electronically.

## 2022-10-09 NOTE — Patient Instructions (Signed)
Handout on polyps and hemorrhoids given to patient.  Await pathology results. Resume previous diet and continue present medications. Repeat colonoscopy in 3-5 years for surveillance, will be based off of pathology results.   YOU HAD AN ENDOSCOPIC PROCEDURE TODAY AT [facility]:   Refer to the procedure report that was given to you for any specific questions about what was found during the examination.  If the procedure report does not answer your questions, please call your gastroenterologist to clarify.  If you requested that your care partner not be given the details of your procedure findings, then the procedure report has been included in a sealed envelope for you to review at your convenience later.  YOU SHOULD EXPECT: Some feelings of bloating in the abdomen. Passage of more gas than usual.  Walking can help get rid of the air that was put into your GI tract during the procedure and reduce the bloating. If you had a lower endoscopy (such as a colonoscopy or flexible sigmoidoscopy) you may notice spotting of blood in your stool or on the toilet paper. If you underwent a bowel prep for your procedure, you may not have a normal bowel movement for a few days.  Please Note:  You might notice some irritation and congestion in your nose or some drainage.  This is from the oxygen used during your procedure.  There is no need for concern and it should clear up in a day or so.  SYMPTOMS TO REPORT IMMEDIATELY:  Following lower endoscopy (colonoscopy or flexible sigmoidoscopy):  Excessive amounts of blood in the stool  Significant tenderness or worsening of abdominal pains  Swelling of the abdomen that is new, acute  Fever of 100F or higher  For urgent or emergent issues, a gastroenterologist can be reached at any hour by calling [phone]. Do not use MyChart messaging for urgent concerns.    DIET:  We do recommend a small meal at first, but then you may proceed to your  regular diet.  Drink plenty of fluids but you should avoid alcoholic beverages for 24 hours.  ACTIVITY:  You should plan to take it easy for the rest of today and you should NOT DRIVE or use heavy machinery until tomorrow (because of the sedation medicines used during the test).    FOLLOW UP: Our staff will call the number listed on your records the next business day following your procedure.  We will call around 7:15- 8:00 am to check on you and address any questions or concerns that you may have regarding the information given to you following your procedure. If we do not reach you, we will leave a message.     If any biopsies were taken you will be contacted by phone or by letter within the next 1-3 weeks.  Please call us at [phone] if you have not heard about the biopsies in 3 weeks.    SIGNATURES/CONFIDENTIALITY: You and/or your care partner have signed paperwork which will be entered into your electronic medical record.  These signatures attest to the fact that that the information above on your After Visit Summary has been reviewed and is understood.  Full responsibility of the confidentiality of this discharge information lies with you and/or your care-partner.

## 2022-10-12 ENCOUNTER — Telehealth: Payer: Self-pay | Admitting: *Deleted

## 2022-10-12 NOTE — Telephone Encounter (Signed)
  Follow up Call-     10/09/2022   12:53 PM  Call back number  Post procedure Call Back phone  # [phone]  Permission to leave phone message Yes     Patient questions:  Do you have a fever, pain , or abdominal swelling? No. Pain Score  0 *  Have you tolerated food without any problems? Yes.    Have you been able to return to your normal activities? Yes.    Do you have any questions about your discharge instructions: Diet   No. Medications  No. Follow up visit  No.  Do you have questions or concerns about your Care? No.  Actions: * If pain score is 4 or above: No action needed, pain <4.

## 2022-10-16 ENCOUNTER — Encounter: Payer: Self-pay | Admitting: Gastroenterology

## 2022-10-23 ENCOUNTER — Other Ambulatory Visit (HOSPITAL_COMMUNITY): Payer: Self-pay

## 2022-10-26 ENCOUNTER — Other Ambulatory Visit (HOSPITAL_COMMUNITY): Payer: Self-pay

## 2022-11-06 ENCOUNTER — Encounter (INDEPENDENT_AMBULATORY_CARE_PROVIDER_SITE_OTHER): Payer: BC Managed Care – PPO | Admitting: Family Medicine

## 2022-11-06 DIAGNOSIS — U071 COVID-19: Secondary | ICD-10-CM

## 2022-11-06 MED ORDER — NIRMATRELVIR/RITONAVIR (PAXLOVID)TABLET: 3.0000 | ORAL_TABLET | Freq: Two times a day (BID) | ORAL | 0 refills | Status: AC

## 2022-11-06 NOTE — Telephone Encounter (Signed)
Please see the MyChart message reply(ies) for my assessment and plan.  The patient gave consent for this Medical Advice Message and is aware that it may result in a bill to their insurance company as well as the possibility that this may result in a co-payment or deductible. They are an established patient, but are not seeking medical advice exclusively about a problem treated during an in person or video visit in the last 7 days. I did not recommend an in person or video visit within 7 days of my reply.  I spent a total of 10 minutes cumulative time within 7 days through MyChart messaging Tamekia Rotter, MD  

## 2022-11-12 ENCOUNTER — Ambulatory Visit (INDEPENDENT_AMBULATORY_CARE_PROVIDER_SITE_OTHER): Payer: BC Managed Care – PPO | Admitting: Family Medicine

## 2022-11-12 ENCOUNTER — Encounter (INDEPENDENT_AMBULATORY_CARE_PROVIDER_SITE_OTHER): Payer: Self-pay | Admitting: Family Medicine

## 2022-11-12 VITALS — BP 125/81 | HR 73 | Temp 98.1°F | Ht 64.0 in | Wt 240.0 lb

## 2022-11-12 DIAGNOSIS — E1169 Type 2 diabetes mellitus with other specified complication: Secondary | ICD-10-CM

## 2022-11-12 DIAGNOSIS — Z636 Dependent relative needing care at home: Secondary | ICD-10-CM

## 2022-11-12 DIAGNOSIS — Z6841 Body Mass Index (BMI) 40.0 and over, adult: Secondary | ICD-10-CM

## 2022-11-12 DIAGNOSIS — K5909 Other constipation: Secondary | ICD-10-CM | POA: Insufficient documentation

## 2022-11-12 DIAGNOSIS — K5901 Slow transit constipation: Secondary | ICD-10-CM | POA: Insufficient documentation

## 2022-11-12 DIAGNOSIS — E559 Vitamin D deficiency, unspecified: Secondary | ICD-10-CM

## 2022-11-12 DIAGNOSIS — Z7985 Long-term (current) use of injectable non-insulin antidiabetic drugs: Secondary | ICD-10-CM

## 2022-11-12 MED ORDER — VITAMIN D (ERGOCALCIFEROL) 1.25 MG (50000 UNIT) PO CAPS
50000.0000 [IU] | ORAL_CAPSULE | ORAL | 0 refills | Status: DC
Start: 2022-11-12 — End: 2022-12-10

## 2022-11-12 NOTE — Assessment & Plan Note (Signed)
Lab Results  Component Value Date   HGBA1C 11.3 (H) 07/20/2022   Her blood sugars have been improving with dietary change, weight reduction and more physical activity.  She is doing well on Mounjaro 7.5 mg once weekly injection, Farxiga 10 mg once daily and metformin 500 mg twice daily per her PCP.  Her A1c will be due at her next visit.  Continue to work on weight reduction, continue current medications and reduce intake of added sugar.

## 2022-11-12 NOTE — Assessment & Plan Note (Signed)
Patient has a net loss of 36 pounds since starting our program 4 months ago. Reviewed bioimpedance results. Will focus on regular exercise over the next few months including both cardio and resistance training for a goal of 4 to 5 days a week.  Plan: Continue prescribed dietary plan.  Focus on physical activity over the next 4 weeks.

## 2022-11-12 NOTE — Progress Notes (Signed)
Office: [phone]  /  Fax: [phone]  WEIGHT SUMMARY AND BIOMETRICS  Starting Date: 07/21/22  Starting Weight: 276lb   Weight Lost Since Last Visit: 9lb   Vitals Temp: 98.1 F (36.7 C) BP: 125/81 Pulse Rate: 73 SpO2: 98 %   Body Composition  Body Fat %: 47.5 % Fat Mass (lbs): 114 lbs Muscle Mass (lbs): 119.8 lbs Total Body Water (lbs): 87.8 lbs Visceral Fat Rating : 15   HPI  Chief Complaint: OBESITY  [name] is here to discuss her progress with her obesity treatment plan. She is on the the Category 4 Plan and states she is following her eating plan approximately 90 % of the time. She states she is exercising 20 minutes 4-5 times per week.   Interval History:  Since last office visit she is down 9 lb She lost 0.2 lb of muscle and lost lost 9.4 lb of body fat Her wife has been supportive She plans to ramp up her exercise She struggles to get in enough water She is getting over COVID-19 She does some stress snacking She has some constipation She plans to ramp up her walking time  Pharmacotherapy: Mounjaro 7.5 mg once weekly injection per PCP for type 2 diabetes  PHYSICAL EXAM:  Blood pressure 125/81, pulse 73, temperature 98.1 F (36.7 C), height 5\' 4"  (1.626 m), weight 240 lb (108.9 kg), SpO2 98 %. Body mass index is 41.2 kg/m.  General: She is overweight, cooperative, alert, well developed, and in no acute distress. PSYCH: Has normal mood, affect and thought process.   Lungs: Normal breathing effort, no conversational dyspnea.   ASSESSMENT AND PLAN  TREATMENT PLAN FOR OBESITY:  Recommended Dietary Goals  [name] is currently in the action stage of change. As such, her goal is to continue weight management plan. She has agreed to the Category 4 Plan.  Behavioral Intervention  We discussed the following Behavioral Modification Strategies today: increasing lean protein intake, increasing vegetables, increasing fiber rich foods, avoiding  skipping meals, increasing water intake, work on meal planning and preparation, emotional eating strategies and understanding the difference between hunger signals and cravings, and work on managing stress, creating time for self-care and relaxation measures.  Additional resources provided today: NA  Recommended Physical Activity Goals  [place] has been advised to work up to 150 minutes of moderate intensity aerobic activity a week and strengthening exercises 2-3 times per week for cardiovascular health, weight loss maintenance and preservation of muscle mass.   She has agreed to Start aerobic activity with a goal of 150 minutes a week at moderate intensity.   Pharmacotherapy changes for the treatment of obesity: None  ASSOCIATED CONDITIONS ADDRESSED TODAY  Slow transit constipation Assessment & Plan: New problem.  She denies a history of constipation but has been going 3 to 4 days at a time without a bowel movement.  She does struggle to get in adequate water reporting only 40 to 50 ounces in a day.  She has been getting in fruits and vegetables on her meal plan.  She is currently only taking over-the-counter Metamucil.  Plan: Increase water intake to at least 64 ounces per day.  Increase intake of nonstarchy vegetables to more than 2 servings in a day.  Keep fruit servings 1-2 servings in a day.  Begin MiraLAX 17 g once daily as needed for constipation.   Vitamin D deficiency Assessment & Plan: Last vitamin D Lab Results  Component Value Date   VD25OH 20.1 (L) 08/06/2022  Doing well on prescription vitamin D 50,000 IU once weekly.  Energy level is starting to improve.  Denies adverse side effects.  Aiming for a target vitamin D level 50-70.  Plan to recheck vitamin D level in May.  Orders: -     Vitamin D (Ergocalciferol); Take 1 capsule (50,000 Units total) by mouth every 7 (seven) days.  Dispense: 5 capsule; Refill: 0  Type 2 diabetes mellitus with other specified complication,  unspecified whether long term insulin use Assessment & Plan: Lab Results  Component Value Date   HGBA1C 11.3 (H) 07/20/2022   Her blood sugars have been improving with dietary change, weight reduction and more physical activity.  She is doing well on Mounjaro 7.5 mg once weekly injection, Farxiga 10 mg once daily and metformin 500 mg twice daily per her PCP.  Her A1c will be due at her next visit.  Continue to work on weight reduction, continue current medications and reduce intake of added sugar.   Morbid obesity Assessment & Plan: Patient has a net loss of 36 pounds since starting our program 4 months ago. Reviewed bioimpedance results. Will focus on regular exercise over the next few months including both cardio and resistance training for a goal of 4 to 5 days a week.  Plan: Continue prescribed dietary plan.  Focus on physical activity over the next 4 weeks.   BMI 40.0-44.9, adult  Caregiver stress Assessment & Plan: Caregiver stress has been an ongoing issue and she has struggled some with emotional eating and food cravings.  She does have a good support system and has been working on mindful eating.  Plan: Begin cognitive behavioral therapy with Dr. Dewaine Conger to work on emotional eating.  Aim for 7 to 8 hours of sleep at night and keep junk food triggers out of the house.       She was informed of the importance of frequent follow up visits to maximize her success with intensive lifestyle modifications for her multiple health conditions.   ATTESTASTION STATEMENTS:  Reviewed by clinician on day of visit: allergies, medications, problem list, medical history, surgical history, family history, social history, and previous encounter notes pertinent to obesity diagnosis.   I have personally spent 30 minutes total time today in preparation, patient care, nutritional counseling and documentation for this visit, including the following: review of clinical lab tests; review of  medical tests/procedures/services.      Glennis Brink, DO DABFM, DABOM [facility] [address] [place], [place] [address] [phone]

## 2022-11-12 NOTE — Assessment & Plan Note (Signed)
Last vitamin D Lab Results  Component Value Date   VD25OH 20.1 (L) 08/06/2022   Doing well on prescription vitamin D 50,000 IU once weekly.  Energy level is starting to improve.  Denies adverse side effects.  Aiming for a target vitamin D level 50-70.  Plan to recheck vitamin D level in May.

## 2022-11-12 NOTE — Assessment & Plan Note (Signed)
New problem.  She denies a history of constipation but has been going 3 to 4 days at a time without a bowel movement.  She does struggle to get in adequate water reporting only 40 to 50 ounces in a day.  She has been getting in fruits and vegetables on her meal plan.  She is currently only taking over-the-counter Metamucil.  Plan: Increase water intake to at least 64 ounces per day.  Increase intake of nonstarchy vegetables to more than 2 servings in a day.  Keep fruit servings 1-2 servings in a day.  Begin MiraLAX 17 g once daily as needed for constipation.

## 2022-11-12 NOTE — Assessment & Plan Note (Signed)
Caregiver stress has been an ongoing issue and she has struggled some with emotional eating and food cravings.  She does have a good support system and has been working on mindful eating.  Plan: Begin cognitive behavioral therapy with Dr. Dewaine Conger to work on emotional eating.  Aim for 7 to 8 hours of sleep at night and keep junk food triggers out of the house.

## 2022-11-13 DIAGNOSIS — Z01419 Encounter for gynecological examination (general) (routine) without abnormal findings: Secondary | ICD-10-CM | POA: Diagnosis not present

## 2022-11-13 DIAGNOSIS — Z124 Encounter for screening for malignant neoplasm of cervix: Secondary | ICD-10-CM | POA: Diagnosis not present

## 2022-11-19 ENCOUNTER — Other Ambulatory Visit: Payer: Self-pay | Admitting: Family Medicine

## 2022-11-19 DIAGNOSIS — E118 Type 2 diabetes mellitus with unspecified complications: Secondary | ICD-10-CM

## 2022-11-23 ENCOUNTER — Other Ambulatory Visit (HOSPITAL_COMMUNITY): Payer: Self-pay

## 2022-11-23 MED ORDER — MOUNJARO 7.5 MG/0.5ML ~~LOC~~ SOAJ
7.5000 mg | SUBCUTANEOUS | 2 refills | Status: DC
Start: 2022-11-23 — End: 2023-01-19
  Filled 2022-11-23: qty 2, 28d supply, fill #0
  Filled 2022-12-20: qty 2, 28d supply, fill #1

## 2022-11-24 ENCOUNTER — Other Ambulatory Visit (HOSPITAL_COMMUNITY): Payer: Self-pay

## 2022-12-10 ENCOUNTER — Ambulatory Visit (INDEPENDENT_AMBULATORY_CARE_PROVIDER_SITE_OTHER): Payer: BC Managed Care – PPO | Admitting: Family Medicine

## 2022-12-10 ENCOUNTER — Encounter (INDEPENDENT_AMBULATORY_CARE_PROVIDER_SITE_OTHER): Payer: Self-pay | Admitting: Family Medicine

## 2022-12-10 VITALS — BP 125/83 | HR 71 | Temp 98.3°F | Ht 64.0 in | Wt 240.0 lb

## 2022-12-10 DIAGNOSIS — K5901 Slow transit constipation: Secondary | ICD-10-CM | POA: Diagnosis not present

## 2022-12-10 DIAGNOSIS — Z7984 Long term (current) use of oral hypoglycemic drugs: Secondary | ICD-10-CM

## 2022-12-10 DIAGNOSIS — F4321 Adjustment disorder with depressed mood: Secondary | ICD-10-CM

## 2022-12-10 DIAGNOSIS — E559 Vitamin D deficiency, unspecified: Secondary | ICD-10-CM

## 2022-12-10 DIAGNOSIS — E1169 Type 2 diabetes mellitus with other specified complication: Secondary | ICD-10-CM | POA: Diagnosis not present

## 2022-12-10 DIAGNOSIS — Z7985 Long-term (current) use of injectable non-insulin antidiabetic drugs: Secondary | ICD-10-CM

## 2022-12-10 DIAGNOSIS — Z6841 Body Mass Index (BMI) 40.0 and over, adult: Secondary | ICD-10-CM

## 2022-12-10 MED ORDER — VITAMIN D (ERGOCALCIFEROL) 1.25 MG (50000 UNIT) PO CAPS
50000.0000 [IU] | ORAL_CAPSULE | ORAL | 0 refills | Status: DC
Start: 2022-12-10 — End: 2023-01-19

## 2022-12-10 NOTE — Progress Notes (Signed)
Office: [phone]  /  Fax: [phone]  WEIGHT SUMMARY AND BIOMETRICS  Starting Date: 07/21/22  Starting Weight: 276lb   Weight Lost Since Last Visit: 0   Vitals Temp: 98.3 F (36.8 C) BP: 125/83 Pulse Rate: 71 SpO2: 100 %   Body Composition  Body Fat %: 48.4 % Fat Mass (lbs): 116.4 lbs Muscle Mass (lbs): 117.8 lbs Total Body Water (lbs): 90.8 lbs Visceral Fat Rating : 15    HPI  Chief Complaint: OBESITY  [name] is here to discuss her progress with her obesity treatment plan. She is on the the Category 4 Plan and states she is following her eating plan approximately 50 % of the time. She states she is exercising 20-30 minutes 2 times per week.   Interval History:  Since last office visit she is down 0 lb She has a net weight loss of 36 pounds in the past 6 months She did go through the loss of her mom between visits She tried to be mindful throughout her grief She is ready to get back on track She has not met with Dr Dewaine Conger yet She has had more carb/ sugar cravings She remains on Mounjaro 7.5 mg weekly through her PCP for type 2 diabetes   Pharmacotherapy: Mounjaro 7.5 mg weekly per PCP for type 2 diabetes  PHYSICAL EXAM:  Blood pressure 125/83, pulse 71, temperature 98.3 F (36.8 C), height 5\' 4"  (1.626 m), weight 240 lb (108.9 kg), SpO2 100 %. Body mass index is 41.2 kg/m.  General: She is overweight, cooperative, alert, well developed, and in no acute distress. PSYCH: Has normal mood, affect and thought process.   Lungs: Normal breathing effort, no conversational dyspnea.   ASSESSMENT AND PLAN  TREATMENT PLAN FOR OBESITY:  Recommended Dietary Goals  [name] is currently in the action stage of change. As such, her goal is to continue weight management plan. She has agreed to the Category 4 Plan.  Behavioral Intervention  We discussed the following Behavioral Modification Strategies today: increasing lean protein intake, decreasing  simple carbohydrates , increasing vegetables, increasing lower glycemic fruits, avoiding skipping meals, increasing water intake, work on meal planning and preparation, keeping healthy foods at home, continue to practice mindfulness when eating, and planning for success.  Additional resources provided today: NA  Recommended Physical Activity Goals  [name] has been advised to work up to 150 minutes of moderate intensity aerobic activity a week and strengthening exercises 2-3 times per week for cardiovascular health, weight loss maintenance and preservation of muscle mass.   She has agreed to Start aerobic activity with a goal of 150 minutes a week at moderate intensity.   Pharmacotherapy changes for the treatment of obesity: None  ASSOCIATED CONDITIONS ADDRESSED TODAY  Type 2 diabetes mellitus with other specified complication, unspecified whether long term insulin use (HCC) Assessment & Plan: Lab Results  Component Value Date   HGBA1C 11.3 (H) 07/20/2022   She is doing well on Mounjaro 7.5 mg once weekly injection.  This has helped with weight reduction and improved satiety.  She remains on metformin 500 mg twice daily with food.  She denies adverse side effects other than some constipation.  She is doing well on Farxiga 10 mg once daily.  Her a.m. fasting glucose has been averaging 70-80.  She denies hypoglycemia.  Continue current medications per PCP.  Recheck A1c and fasting insulin today. Continue to work on a low sugar/low starch diet, regular exercise and weight reduction.  Orders: -  Insulin, random -     Hemoglobin A1c  Vitamin D deficiency Assessment & Plan: Last vitamin D Lab Results  Component Value Date   VD25OH 20.1 (L) 08/06/2022   She has been taking vitamin D 50,000 IU once weekly.  Her energy level is stable.  She denies adverse side effects.  Recheck vitamin D level today.  Refilled vitamin D 50,000 IU once weekly.  Target range 50-70.  Orders: -      Vitamin D (Ergocalciferol); Take 1 capsule (50,000 Units total) by mouth every 7 (seven) days.  Dispense: 5 capsule; Refill: 0 -     VITAMIN D 25 Hydroxy (Vit-D Deficiency, Fractures)  Slow transit constipation Assessment & Plan: She was experiencing constipation at the time of her last visit, worsened by [facility].  She has both hard stools and reduced frequency of stools.  Plan: Add Metamucil once daily with a full glass of water.  Use MiraLAX as needed if no bowel movement after 3 days.  Increase water intake to 90 ounces daily Focus on adding in more fruits and vegetables daily   Grief reaction Assessment & Plan: She is grieving the loss of her mom to a prolonged illness from cancer.  She was her caregiver for 4 years.  She will have more time to focus on her health.  She has a good support system at home and with her family.  She plans to set up a CBT visit with Dr. Dewaine Conger for some emotional eating that was occurring even before the passing of her mom. Continue to focus on self-care, adequate sleep at night and good nutrition.  Continue to work on  outdoor walking.   Morbid obesity (HCC) with starting BMI 47  BMI 40.0-44.9, adult (HCC)      She was informed of the importance of frequent follow up visits to maximize her success with intensive lifestyle modifications for her multiple health conditions.   ATTESTASTION STATEMENTS:  Reviewed by clinician on day of visit: allergies, medications, problem list, medical history, surgical history, family history, social history, and previous encounter notes pertinent to obesity diagnosis.   I have personally spent 30 minutes total time today in preparation, patient care, nutritional counseling and documentation for this visit, including the following: review of clinical lab tests; review of medical tests/procedures/services.      Glennis Brink, DO DABFM, DABOM [facility] [address] [place], [place]  [address] [phone]

## 2022-12-10 NOTE — Assessment & Plan Note (Signed)
Last vitamin D Lab Results  Component Value Date   VD25OH 20.1 (L) 08/06/2022   She has been taking vitamin D 50,000 IU once weekly.  Her energy level is stable.  She denies adverse side effects.  Recheck vitamin D level today.  Refilled vitamin D 50,000 IU once weekly.  Target range 50-70.

## 2022-12-10 NOTE — Assessment & Plan Note (Signed)
She was experiencing constipation at the time of her last visit, worsened by [facility].  She has both hard stools and reduced frequency of stools.  Plan: Add Metamucil once daily with a full glass of water.  Use MiraLAX as needed if no bowel movement after 3 days.  Increase water intake to 90 ounces daily Focus on adding in more fruits and vegetables daily

## 2022-12-10 NOTE — Assessment & Plan Note (Signed)
Lab Results  Component Value Date   HGBA1C 11.3 (H) 07/20/2022   She is doing well on Mounjaro 7.5 mg once weekly injection.  This has helped with weight reduction and improved satiety.  She remains on metformin 500 mg twice daily with food.  She denies adverse side effects other than some constipation.  She is doing well on Farxiga 10 mg once daily.  Her a.m. fasting glucose has been averaging 70-80.  She denies hypoglycemia.  Continue current medications per PCP.  Recheck A1c and fasting insulin today. Continue to work on a low sugar/low starch diet, regular exercise and weight reduction.

## 2022-12-10 NOTE — Assessment & Plan Note (Signed)
She is grieving the loss of her mom to a prolonged illness from cancer.  She was her caregiver for 4 years.  She will have more time to focus on her health.  She has a good support system at home and with her family.  She plans to set up a CBT visit with Dr. Dewaine Conger for some emotional eating that was occurring even before the passing of her mom. Continue to focus on self-care, adequate sleep at night and good nutrition.  Continue to work on  outdoor walking.

## 2022-12-11 ENCOUNTER — Encounter: Payer: Self-pay | Admitting: Family

## 2022-12-11 ENCOUNTER — Ambulatory Visit (INDEPENDENT_AMBULATORY_CARE_PROVIDER_SITE_OTHER): Payer: BC Managed Care – PPO | Admitting: Family

## 2022-12-11 ENCOUNTER — Ambulatory Visit (INDEPENDENT_AMBULATORY_CARE_PROVIDER_SITE_OTHER)
Admission: RE | Admit: 2022-12-11 | Discharge: 2022-12-11 | Disposition: A | Discharge status: Home / Self Care | Payer: BC Managed Care – PPO | Source: Ambulatory Visit | Attending: Family | Admitting: Family

## 2022-12-11 VITALS — BP 126/78 | HR 85 | Ht 64.0 in | Wt 244.6 lb

## 2022-12-11 DIAGNOSIS — R0789 Other chest pain: Secondary | ICD-10-CM | POA: Diagnosis not present

## 2022-12-11 DIAGNOSIS — M549 Dorsalgia, unspecified: Secondary | ICD-10-CM

## 2022-12-11 DIAGNOSIS — M546 Pain in thoracic spine: Secondary | ICD-10-CM | POA: Diagnosis not present

## 2022-12-11 LAB — HEMOGLOBIN A1C
Est. average glucose Bld gHb Est-mCnc: 114 mg/dL
Hgb A1c MFr Bld: 5.6 % (ref 4.8–5.6)

## 2022-12-11 LAB — VITAMIN D 25 HYDROXY (VIT D DEFICIENCY, FRACTURES): Vit D, 25-Hydroxy: 60.6 ng/mL (ref 30.0–100.0)

## 2022-12-11 LAB — INSULIN, RANDOM: INSULIN: 27.4 u[IU]/mL — ABNORMAL HIGH (ref 2.6–24.9)

## 2022-12-11 MED ORDER — TIZANIDINE HCL 4 MG PO TABS: 4.0000 mg | ORAL_TABLET | Freq: Every day | ORAL | 0 refills | Status: DC

## 2022-12-11 MED ORDER — MELOXICAM 15 MG PO TABS: 15.0000 mg | ORAL_TABLET | Freq: Every day | ORAL | 0 refills | Status: DC

## 2022-12-11 NOTE — Progress Notes (Signed)
[name] is a 52 y.o. female with the following history as recorded in [employer]:  Patient Active Problem List   Diagnosis Date Noted   Grief reaction 12/10/2022   Slow transit constipation 11/12/2022   Caregiver stress 10/08/2022   Vitamin D deficiency 08/20/2022   Insulin resistance 08/20/2022   Other fatigue 08/06/2022   SOBOE (shortness of breath on exertion) 08/06/2022   Other hyperlipidemia 08/06/2022   Type 2 diabetes mellitus with other specified complication (HCC) 08/06/2022   OSA (obstructive sleep apnea) 08/06/2022   Essential hypertension 08/06/2022   Depression 08/06/2022   Depression screen 08/06/2022   Controlled diabetes mellitus type 2 with complications (HCC) 01/09/2021   History of benign ovarian tumor 05/24/2019   Personal history of colonic polyps    H/O colonoscopy with polypectomy    Acute lower GI bleeding 02/16/2018   Acute blood loss anemia 02/16/2018   Morbid obesity (HCC) with starting BMI 47 07/07/2013   Hypothyroidism 09/27/2011    Current Outpatient Medications  Medication Sig Dispense Refill   dapagliflozin propanediol (FARXIGA) 10 MG TABS tablet Take 1 tablet (10 mg total) by mouth daily before breakfast. 90 tablet 3   dicyclomine (BENTYL) 10 MG capsule Take 1 20-30 minutes before breakfast and dinner 60 capsule 3   losartan (COZAAR) 25 MG tablet Take 1 tablet (25 mg total) by mouth daily. 90 tablet 3   Melatonin Gummies 2.5 MG CHEW Chew 2.5 mg by mouth at bedtime.      meloxicam (MOBIC) 15 MG tablet Take 1 tablet (15 mg total) by mouth daily. 30 tablet 0   metFORMIN (GLUCOPHAGE) 500 MG tablet TAKE 1 TABLET(500 MG) BY MOUTH TWICE DAILY WITH A MEAL 180 tablet 1   Multiple Vitamins-Minerals (MULTIVITAMIN WITH MINERALS) tablet Take 1 tablet by mouth at bedtime. Reported on 07/24/2015     Rimegepant Sulfate (NURTEC) 75 MG TBDP Take 75 mg by mouth daily as needed. 16 tablet 5   rosuvastatin (CRESTOR) 20 MG tablet Take 1 tablet (20 mg total) by  mouth daily. 90 tablet 3   thyroid (NP THYROID) 30 MG tablet TAKE 3 TABLETS BY MOUTH EVERY DAY BEFORE BREAKFAST 270 tablet 1   tirzepatide (MOUNJARO) 7.5 MG/0.5ML Pen Inject 7.5 mg into the skin once a week. 6 mL 2   tiZANidine (ZANAFLEX) 4 MG tablet Take 1 tablet (4 mg total) by mouth at bedtime. 30 tablet 0   Vitamin D, Ergocalciferol, (DRISDOL) 1.25 MG (50000 UNIT) CAPS capsule Take 1 capsule (50,000 Units total) by mouth every 7 (seven) days. 5 capsule 0   No current facility-administered medications for this visit.    Allergies: Levothyroxine sodium, Lisinopril, and Levothyroxine  Past Medical History:  Diagnosis Date   Anxiety    Benign ovarian tumor 02/2011   17 pound benign tumor- removed   Depression    Diabetes 1.5, managed as type 2 (HCC)    Fatty liver    High cholesterol    Hypertension    Hypothyroidism    Personal history of colonic polyps    Skin cancer    Sleep apnea    Thyroid disease    Vitamin D deficiency     Past Surgical History:  Procedure Laterality Date   ABDOMINAL HYSTERECTOMY  2012   one ovary remains- due to giant benign ovarian cyst (17 lbs)   COLONOSCOPY WITH PROPOFOL N/A 02/16/2018   Procedure: COLONOSCOPY WITH PROPOFOL;  Surgeon: Beverley Fiedler, MD;  Location: [facility];  Service: Gastroenterology;  Laterality: N/A;  OVARIAN CYST REMOVAL      Family History  Problem Relation Age of Onset   Cancer Mother    COPD Mother    Congestive Heart Failure Mother    Lung cancer Mother        highly suspected, waiting on definitive bx 03/14/18   Thyroid disease Father    Heart disease Father    Stroke Father    Heart attack Father    Hyperlipidemia Father    Hypertension Father    Colon polyps Father        benign   Obesity Father    Gallbladder disease Maternal Grandmother    Stroke Paternal Grandmother    Heart disease Paternal Grandfather    Heart attack Paternal Grandfather    Colon cancer Neg Hx    Stomach cancer Neg Hx    Esophageal  cancer Neg Hx    Rectal cancer Neg Hx     Social History   Tobacco Use   Smoking status: Never   Smokeless tobacco: Never  Substance Use Topics   Alcohol use: No    Alcohol/week: 0.0 standard drinks of alcohol    Comment: socially    Subjective:   Presents with concerns for 2 month history of  mid-chest and mid back pain; symptoms initially started in mid- upper back- mother was in a wheelchair and has been helping to lift her to and from the bed; unfortunately, did lose her mother unfortunately 2 weeks ago; also mentions that has had more problems with constipation in the past few months- normal colonoscopy done in early March; does not seem food related; more noticeable with certain movements; no chest pain on exertion; able to take full deep breath;   Objective:  Vitals:   12/11/22 1332  BP: 126/78  Pulse: 85  SpO2: 99%  Weight: 244 lb 9.6 oz (110.9 kg)  Height: 5\' 4"  (1.626 m)    General: Well developed, well nourished, in no acute distress  Skin : Warm and dry.  Head: Normocephalic and atraumatic  Eyes: Sclera and conjunctiva clear; pupils round and reactive to light; extraocular movements intact  Ears: External normal; canals clear; tympanic membranes normal  Oropharynx: Pink, supple. No suspicious lesions  Neck: Supple without thyromegaly, adenopathy  Lungs: Respirations unlabored; clear to auscultation bilaterally without wheeze, rales, rhonchi  CVS exam: normal rate and regular rhythm.  Musculoskeletal: No deformities; no active joint inflammation  Extremities: No edema, cyanosis, clubbing  Vessels: Symmetric bilaterally  Neurologic: Alert and oriented; speech intact; face symmetrical; moves all extremities well; CNII-XII intact without focal deficit   Assessment:  1. Atypical chest pain   2. Mid back pain     Plan:  Will update CXR and mid- back X-ray; suspect muscular source; trial of Mobic 15 mg and Tizandidine 4 mg qhs; follow up worse, no better.   No  follow-ups on file.  Orders Placed This Encounter  Procedures   DG Chest 2 View    Standing Status:   Future    Standing Expiration Date:   12/11/2023    Order Specific Question:   Reason for Exam (SYMPTOM  OR DIAGNOSIS REQUIRED)    Answer:   atypical chest pain    Order Specific Question:   Is patient pregnant?    Answer:   No    Order Specific Question:   Preferred imaging location?    Answer:   [facility]   DG Thoracic Spine 2 View    Standing Status:  Future    Standing Expiration Date:   06/13/2023    Order Specific Question:   Reason for Exam (SYMPTOM  OR DIAGNOSIS REQUIRED)    Answer:   mid back pain    Order Specific Question:   Is the patient pregnant?    Answer:   No    Order Specific Question:   Preferred imaging location?    Answer:   [facility]    Requested Prescriptions   Signed Prescriptions Disp Refills   meloxicam (MOBIC) 15 MG tablet 30 tablet 0    Sig: Take 1 tablet (15 mg total) by mouth daily.   tiZANidine (ZANAFLEX) 4 MG tablet 30 tablet 0    Sig: Take 1 tablet (4 mg total) by mouth at bedtime.

## 2022-12-12 ENCOUNTER — Encounter: Payer: Self-pay | Admitting: Family Medicine

## 2022-12-14 ENCOUNTER — Ambulatory Visit: Payer: BC Managed Care – PPO | Admitting: Family Medicine

## 2022-12-15 ENCOUNTER — Encounter: Payer: Self-pay | Admitting: Family

## 2022-12-22 ENCOUNTER — Other Ambulatory Visit (HOSPITAL_COMMUNITY): Payer: Self-pay

## 2022-12-30 ENCOUNTER — Other Ambulatory Visit: Payer: Self-pay | Admitting: Family Medicine

## 2022-12-30 DIAGNOSIS — Z1231 Encounter for screening mammogram for malignant neoplasm of breast: Secondary | ICD-10-CM

## 2023-01-15 ENCOUNTER — Ambulatory Visit: Payer: BC Managed Care – PPO

## 2023-01-19 ENCOUNTER — Ambulatory Visit (INDEPENDENT_AMBULATORY_CARE_PROVIDER_SITE_OTHER): Payer: BC Managed Care – PPO | Admitting: Family Medicine

## 2023-01-19 ENCOUNTER — Encounter (INDEPENDENT_AMBULATORY_CARE_PROVIDER_SITE_OTHER): Payer: Self-pay | Admitting: Family Medicine

## 2023-01-19 ENCOUNTER — Other Ambulatory Visit (HOSPITAL_COMMUNITY): Payer: Self-pay

## 2023-01-19 VITALS — BP 120/82 | HR 68 | Temp 98.3°F | Ht 64.0 in | Wt 234.0 lb

## 2023-01-19 DIAGNOSIS — Z6841 Body Mass Index (BMI) 40.0 and over, adult: Secondary | ICD-10-CM

## 2023-01-19 DIAGNOSIS — E118 Type 2 diabetes mellitus with unspecified complications: Secondary | ICD-10-CM

## 2023-01-19 DIAGNOSIS — F3289 Other specified depressive episodes: Secondary | ICD-10-CM | POA: Diagnosis not present

## 2023-01-19 DIAGNOSIS — R110 Nausea: Secondary | ICD-10-CM | POA: Diagnosis not present

## 2023-01-19 DIAGNOSIS — E559 Vitamin D deficiency, unspecified: Secondary | ICD-10-CM | POA: Diagnosis not present

## 2023-01-19 DIAGNOSIS — E1169 Type 2 diabetes mellitus with other specified complication: Secondary | ICD-10-CM

## 2023-01-19 DIAGNOSIS — Z7985 Long-term (current) use of injectable non-insulin antidiabetic drugs: Secondary | ICD-10-CM

## 2023-01-19 DIAGNOSIS — F329 Major depressive disorder, single episode, unspecified: Secondary | ICD-10-CM

## 2023-01-19 MED ORDER — TIRZEPATIDE 10 MG/0.5ML ~~LOC~~ SOAJ
10.0000 mg | SUBCUTANEOUS | 0 refills | Status: DC
Start: 2023-01-19 — End: 2023-02-15
  Filled 2023-01-19: qty 2, 28d supply, fill #0

## 2023-01-19 MED ORDER — ONDANSETRON 8 MG PO TBDP
8.0000 mg | ORAL_TABLET | Freq: Three times a day (TID) | ORAL | 0 refills | Status: DC | PRN
  Filled 2023-01-19: qty 20, 7d supply, fill #0

## 2023-01-19 NOTE — Assessment & Plan Note (Addendum)
Lab Results  Component Value Date   HGBA1C 5.6 12/10/2022   Her A1c is much improved with an A1c 11.3 --> 5.6 Has done well with Mounjaro 7.5 mg weekly since Jan but has felt hungrier lately. Denies GI side effects.  Constipation under better control Working on healthy lifestyle changes Has lost 42 lb in the past 7 mos of medically supervised weight management which is a 15% TBW loss  Continue prescribed diet, increasing walking time to 8,000 + steps/ day Add in weight training 1 x a week Increase Mounjaro to 10 mg weekly injection

## 2023-01-19 NOTE — Progress Notes (Signed)
Office: [phone]  /  Fax: [phone]  WEIGHT SUMMARY AND BIOMETRICS  Starting Date: 07/21/22  Starting Weight: 276lb   Weight Lost Since Last Visit: 6lb   Vitals Temp: 98.3 F (36.8 C) BP: 120/82 Pulse Rate: 68 SpO2: 100 %   Body Composition  Body Fat %: 43.8 % Fat Mass (lbs): 102.4 lbs Muscle Mass (lbs): 125 lbs Total Body Water (lbs): 87.6 lbs Visceral Fat Rating : 13     HPI  Chief Complaint: OBESITY  [name] is here to discuss her progress with her obesity treatment plan. She is on the the Category 4 Plan and states she is following her eating plan approximately 80 % of the time. She states she is exercising 30-45 minutes 5 times per week.   Interval History:  Since last office visit she is down 6 lb She gained 7.2 lb of muscle and lost 14 lb of body fat in the past month She is feeling less satiety on Mounjaro 7.5 mg -- has been on this dose since Jan She denies GI upset She hasn't started going to the gym  Grieving the loss of her mom Constipation is improving, using metmucil She has increased her walking time and plans to add in weight training at the gym She has lost 42 lb in 7 mos of medically supervised weight management which is a 15% TBW loss  Pharmacotherapy: Mounjaro 7.5 mg weekly for T2DM  PHYSICAL EXAM:  Blood pressure 120/82, pulse 68, temperature 98.3 F (36.8 C), height 5\' 4"  (1.626 m), weight 234 lb (106.1 kg), SpO2 100 %. Body mass index is 40.17 kg/m.  General: She is overweight, cooperative, alert, well developed, and in no acute distress. PSYCH: Has normal mood, affect and thought process.   Lungs: Normal breathing effort, no conversational dyspnea.   ASSESSMENT AND PLAN  TREATMENT PLAN FOR OBESITY:  Recommended Dietary Goals  [name] is currently in the action stage of change. As such, her goal is to continue weight management plan. She has agreed to the Category 4 Plan.  Behavioral Intervention  We discussed  the following Behavioral Modification Strategies today: increasing lean protein intake, decreasing simple carbohydrates , increasing vegetables, increasing lower glycemic fruits, increasing water intake, work on meal planning and preparation, keeping healthy foods at home, work on managing stress, creating time for self-care and relaxation measures, avoiding temptations and identifying enticing environmental cues, continue to practice mindfulness when eating, and planning for success.  Additional resources provided today: NA  Recommended Physical Activity Goals  Tyrielle has been advised to work up to 150 minutes of moderate intensity aerobic activity a week and strengthening exercises 2-3 times per week for cardiovascular health, weight loss maintenance and preservation of muscle mass.   She has agreed to [employer] strengthening exercises with a goal of 2-3 sessions a week  and Increase the intensity, frequency or duration of aerobic exercises    Pharmacotherapy changes for the treatment of obesity: Mounjaro increase to 10 mg weekly  ASSOCIATED CONDITIONS ADDRESSED TODAY  Controlled type 2 diabetes mellitus with complication, without long-term current use of insulin (HCC) Assessment & Plan: Lab Results  Component Value Date   HGBA1C 5.6 12/10/2022   Her A1c is much improved with an A1c 11.3 --> 5.6 Has done well with Mounjaro 7.5 mg weekly since Jan but has felt hungrier lately. Denies GI side effects.  Constipation under better control Working on healthy lifestyle changes Has lost 42 lb in the past 7 mos of medically supervised weight  management which is a 15% TBW loss  Continue prescribed diet, increasing walking time to 8,000 + steps/ day Add in weight training 1 x a week Increase Mounjaro to 10 mg weekly injection  Orders: -     Tirzepatide; Inject 10 mg into the skin once a week.  Dispense: 6 mL; Refill: 0  Vitamin D deficiency Assessment & Plan: Last vitamin D Lab Results   Component Value Date   VD25OH 60.6 12/10/2022   Vitamin D level has improved from 20.1 to 60.6 on RX vitamin D 50,000 international until weekly x 4 mos Energy level is improving  D/c RX vitamin D and change to OTC Vitamin D3 2,000 international units daily and recheck in the Fall   Morbid obesity (HCC) with starting BMI 47  BMI 40.0-44.9, adult (HCC)  Nausea Assessment & Plan: Worsened with dose increases of Mounjaro in the past. Will be ramping from 7.5 mg to 10 mg weekly  RX for Zofran ODT given to use prn nausea Cautioned about worsening constipation from use of Zofran  Orders: -     Ondansetron; Take 1 tablet (8 mg total) by mouth every 8 (eight) hours as needed for nausea or vomiting.  Dispense: 20 tablet; Refill: 0  Reactive depression Assessment & Plan: She has gone through grief reaction since losing her mom. She has a good support system but has struggled with emotional eating habits in the past and would like to work on more mindful eating habits.  Set up CBT with Dr Dewaine Conger       She was informed of the importance of frequent follow up visits to maximize her success with intensive lifestyle modifications for her multiple health conditions.   ATTESTASTION STATEMENTS:  Reviewed by clinician on day of visit: allergies, medications, problem list, medical history, surgical history, family history, social history, and previous encounter notes pertinent to obesity diagnosis.   I have personally spent 30 minutes total time today in preparation, patient care, nutritional counseling and documentation for this visit, including the following: review of clinical lab tests; review of medical tests/procedures/services.      Glennis Brink, DO DABFM, DABOM [facility] [address] [place], [place] [address] [phone]

## 2023-01-19 NOTE — Assessment & Plan Note (Signed)
She has gone through grief reaction since losing her mom. She has a good support system but has struggled with emotional eating habits in the past and would like to work on more mindful eating habits.  Set up CBT with Dr Dewaine Conger

## 2023-01-19 NOTE — Assessment & Plan Note (Signed)
Worsened with dose increases of Mounjaro in the past. Will be ramping from 7.5 mg to 10 mg weekly  RX for Zofran ODT given to use prn nausea Cautioned about worsening constipation from use of Zofran

## 2023-01-19 NOTE — Assessment & Plan Note (Signed)
Last vitamin D Lab Results  Component Value Date   VD25OH 60.6 12/10/2022   Vitamin D level has improved from 20.1 to 60.6 on RX vitamin D 50,000 international until weekly x 4 mos Energy level is improving  D/c RX vitamin D and change to OTC Vitamin D3 2,000 international units daily and recheck in the Fall

## 2023-02-09 ENCOUNTER — Ambulatory Visit
Admission: RE | Admit: 2023-02-09 | Discharge: 2023-02-09 | Disposition: A | Discharge status: Home / Self Care | Payer: BC Managed Care – PPO | Source: Ambulatory Visit | Attending: Family Medicine | Admitting: Family Medicine

## 2023-02-09 DIAGNOSIS — Z1231 Encounter for screening mammogram for malignant neoplasm of breast: Secondary | ICD-10-CM

## 2023-02-11 ENCOUNTER — Other Ambulatory Visit: Payer: Self-pay | Admitting: Family Medicine

## 2023-02-11 DIAGNOSIS — R928 Other abnormal and inconclusive findings on diagnostic imaging of breast: Secondary | ICD-10-CM

## 2023-02-15 ENCOUNTER — Encounter (INDEPENDENT_AMBULATORY_CARE_PROVIDER_SITE_OTHER): Payer: Self-pay | Admitting: Family Medicine

## 2023-02-15 ENCOUNTER — Other Ambulatory Visit (HOSPITAL_COMMUNITY): Payer: Self-pay

## 2023-02-15 ENCOUNTER — Telehealth (INDEPENDENT_AMBULATORY_CARE_PROVIDER_SITE_OTHER): Payer: BC Managed Care – PPO | Admitting: Family Medicine

## 2023-02-15 DIAGNOSIS — F5089 Other specified eating disorder: Secondary | ICD-10-CM | POA: Diagnosis not present

## 2023-02-15 DIAGNOSIS — Z6841 Body Mass Index (BMI) 40.0 and over, adult: Secondary | ICD-10-CM

## 2023-02-15 DIAGNOSIS — E118 Type 2 diabetes mellitus with unspecified complications: Secondary | ICD-10-CM

## 2023-02-15 MED ORDER — TIRZEPATIDE 12.5 MG/0.5ML ~~LOC~~ SOAJ
12.5000 mg | SUBCUTANEOUS | 0 refills | Status: DC
Start: 2023-02-15 — End: 2023-03-15
  Filled 2023-02-15: qty 2, 28d supply, fill #0

## 2023-02-15 NOTE — Progress Notes (Signed)
TeleHealth Visit:  This visit was completed with telemedicine (audio/video) technology. [name] has verbally consented to this TeleHealth visit. The patient is located at home, the provider is located at home. The participants in this visit include the listed provider and patient. The visit was conducted today via MyChart video.  OBESITY [name] is here to discuss her progress with her obesity treatment plan along with follow-up of her obesity related diagnoses.   Today's visit was # 8 Starting weight: 276 lbs Starting date: 07/22/23 Weight at last in office visit: 234 lbs on 01/19/23 Total weight loss: 42 lbs at last in office visit on 01/19/23. Today's reported weight (02/15/23):  235 lbs  Nutrition Plan: the Category 4 plan  Current exercise: walking 30-45 minutes 3 times per week. She has starting using hand weights 3 times per week.  Interim History:  She has lost 42 pounds since starting our program on 07/22/2023 and reduced her A1c from 11.3 to 5.6. She has not noticed decreased appetite or cravings with increased dose (10 mg) of Mounjaro. Her meals are on plan. She notes increased cravings which she feels may be a stress reaction. Her mother died 3 months ago from cancer. He mother lived with her. She is overeating healthy foods such Yasso bars. Her family eats dinner at home. No intake of sugar sweetened beverages.  Walking only 3 days per week vs 5 days per week as she was.  However she has added in weight training with hand weights 3 days/week.  Assessment/Plan:  1.  Controlled type 2 Diabetes Mellitus  without long-term current use of insulin HgbA1c is at goal. Last A1c was 5.6, down from 11.3 on 07/20/2022. Medication(s): Metformin 500 mg twice daily with meals per PCP, Mounjaro 10 mg, Farxiga 10 mg daily.  Weekly-still notes food cravings.  Lab Results  Component Value Date   HGBA1C 5.6 12/10/2022   HGBA1C 11.3 (H) 07/20/2022   HGBA1C 10.1 (H) 01/26/2022    Lab Results  Component Value Date   MICROALBUR 1.4 07/20/2022   LDLCALC 49 01/26/2022   CREATININE 0.57 08/06/2022   Lab Results  Component Value Date   GFR 101.87 07/20/2022   GFR 103.40 01/26/2022   GFR 101.27 04/24/2021    Plan: Continue and increase dose Mounjaro 12.5 mg SQ weekly Continue metformin 500 mg twice daily with meals and Farxiga 10 mg daily.   2. Eating disorder/emotional eating [name] reports snacking between meals due to cravings rather than hunger. Mother who lived with her passed 3 months ago from cancer. Has been snacking/overeating healthy foods such as Yasso bars, nuts, cheese. She recognizes that the snacking is likely related to stress/grief. Currently this is poorly controlled. Overall mood is stable. Medication(s): none Has initial appointment with Dr. Dewaine Baker on 02/22/2023.  Plan: Follow-up with Dr. Dewaine Baker on 02/22/2023. Consider not purchasing Yasso bars.   3. Morbid Obesity: Current BMI 40  [name] is currently in the action stage of change. As such, her goal is to continue with weight loss efforts.  She has agreed to the Category 4 plan.  Exercise goals: Increase walking to 4-5 days/week.  Continue resistance training for arms 3 days/week. Consider adding in other resistance training-check out videos on [employer] such as yoga.  Behavioral modification strategies: decreasing simple carbohydrates , decrease snacking , and mindful eating.  [name] has agreed to follow-up with our clinic in 4 weeks.  No orders of the defined types were placed in this encounter.   Medications Discontinued During This  Encounter  Medication Reason   tirzepatide Greggory Keen) 10 MG/0.5ML Pen      Meds ordered this encounter  Medications   tirzepatide (MOUNJARO) 12.5 MG/0.5ML Pen    Sig: Inject 12.5 mg into the skin once a week.    Dispense:  2 mL    Refill:  0    Order Specific Question:   Supervising Provider    Answer:   Glennis Brink [[id number]]       Objective:   VITALS: Per patient if applicable, see vitals. GENERAL: Alert and in no acute distress. CARDIOPULMONARY: No increased WOB. Speaking in clear sentences.  PSYCH: Pleasant and cooperative. Speech normal rate and rhythm. Affect is appropriate. Insight and judgement are appropriate. Attention is focused, linear, and appropriate.  NEURO: Oriented as arrived to appointment on time with no prompting.   Attestation Statements:   Reviewed by clinician on day of visit: allergies, medications, problem list, medical history, surgical history, family history, social history, and previous encounter notes.   This was prepared with the assistance of Engineer, civil (consulting).  Occasional wrong-word or sound-a-like substitutions may have occurred due to the inherent limitations of voice recognition software.

## 2023-02-16 ENCOUNTER — Ambulatory Visit (INDEPENDENT_AMBULATORY_CARE_PROVIDER_SITE_OTHER): Payer: BC Managed Care – PPO | Admitting: Family Medicine

## 2023-02-19 ENCOUNTER — Encounter: Payer: Self-pay | Admitting: Family Medicine

## 2023-02-19 ENCOUNTER — Ambulatory Visit (INDEPENDENT_AMBULATORY_CARE_PROVIDER_SITE_OTHER): Payer: BC Managed Care – PPO | Admitting: Family Medicine

## 2023-02-19 VITALS — BP 108/80 | HR 80 | Temp 98.1°F | Resp 18 | Ht 64.0 in | Wt 237.6 lb

## 2023-02-19 DIAGNOSIS — W57XXXA Bitten or stung by nonvenomous insect and other nonvenomous arthropods, initial encounter: Secondary | ICD-10-CM | POA: Diagnosis not present

## 2023-02-19 DIAGNOSIS — S30860A Insect bite (nonvenomous) of lower back and pelvis, initial encounter: Secondary | ICD-10-CM

## 2023-02-19 MED ORDER — DOXYCYCLINE HYCLATE 100 MG PO TABS
100.0000 mg | ORAL_TABLET | Freq: Two times a day (BID) | ORAL | 0 refills | Status: DC
Start: 2023-02-19 — End: 2023-04-15

## 2023-02-19 NOTE — Patient Instructions (Signed)
Tick Bite Information, Adult  Ticks are insects that draw blood for food. They climb onto people and animals that brush against the leaves and grasses that they live in. They then bite and attach to the skin. Most ticks are harmless, but some ticks may carry germs that can cause disease. These germs are spread to a person through a bite. To lower your risk of getting a disease from a tick bite, make sure you: Take steps to prevent tick bites. Check for ticks after being outdoors where ticks live. Watch for symptoms of disease if a tick attached to you or if you think a tick bit you. How can I prevent tick bites? Take these steps to help prevent tick bites when you go outdoors in an area where ticks live: Before you go outdoors: Wear long sleeves and long pants to protect your skin from ticks. Wear light-colored clothing so you can see ticks easier. Tuck your pant legs into your socks. Apply insect repellent that has DEET (20% or higher), picaridin, or IR3535 in it to the following areas: Any bare skin. Avoid areas around the eyes and mouth. Edges of clothing, like the top of your boots, the bottom of your pant legs, and your sleeve cuffs. Consider applying an insect repellant that contains permethrin. Follow the instructions on the label. Do not apply permethrin directly to the skin. Instead, apply to the following areas: Clothing and shoes. Outdoor gear and tents. When you are outdoors: Avoid walking through areas with long grass. If you are walking on a trail, stay in the middle of the trail so your skin, hair, and clothing do not touch the bushes. Check for ticks on your clothing, hair, and skin often while you are outdoors. Check again before you go inside. When you go indoors: Check your clothing for ticks. Tumble dry clothes in a dryer on high heat for at least 10 minutes. If clothes are damp, additional time may be needed. If clothes require washing, use hot water. Check your gear and  pets. Shower soon after being outdoors. Check your body for ticks. Do a full body check using a mirror. Be sure to check your scalp, neck, armpits, waist, groin, and joint areas. These are the spots where ticks attach themselves most often. What is the best way to remove a tick?  Remove the tick as soon as possible. Removing it can prevent germs from passing to your body. Do not remove the tick with your bare fingers. Do not try to remove a tick with heat, alcohol, petroleum jelly, or fingernail polish. These things can cause the tick to salivate and regurgitate into your bloodstream, increasing your risk of getting a disease. To remove a tick that is crawling on your skin: Go outside and brush the tick off. Use tape or a lint roller. To remove a tick that is attached to your skin: Wash your hands. If you have gloves, put them on. Use a fine-tipped tweezer, curved forceps, or a tick-removal tool to gently grasp the tick as close to your skin and the tick's head as possible. Gently pull with a steady, upward, and even pressure until the tick lets go. While removing the tick: Take care to keep the tick's head attached to its body. Do not twist or jerk the tick. This can make the tick's head or mouth parts break off and stay in your skin. If this happens, try to remove the mouth parts with tweezers. If you cannot remove them, leave   the area alone and let the skin heal. Do not squeeze or crush the tick's body. This could force disease-carrying fluids from the tick into your body. What should I do after removing a tick? Clean the bite area and your hands with soap and water, rubbing alcohol, or an iodine scrub. If an antiseptic cream or ointment is available, put a small amount on the bite area. Wash and disinfect any tools that you used to remove the tick. How should I dispose of a tick? To dispose of a live tick, use one of these methods: Place it in rubbing alcohol. Place it in a sealed bag  or container, and throw it away. Wrap it tightly in tape, and throw it away. Flush it down the toilet. Where to find more information Centers for Disease Control and Prevention: cdc.gov/ticks U.[place]. Environmental Protection Agency: epa.gov/insect-repellents Contact a health care provider if: You have symptoms of a disease after a tick bite. Symptoms of a tick-borne disease can occur from moments after the tick bites to 30 days after a tick is removed. Symptoms include: Fever or chills. A red rash that makes a circle (bull's-eye rash) in the bite area. Redness and swelling in the bite area. Headache or stiff neck. Muscle, joint, or bone pain. Abnormal tiredness. Numbness in your legs or trouble walking or moving your legs. Tender or swollen lymph glands. Abdominal pain, vomiting, diarrhea, or weight loss. Get help right away if: You are not able to remove a tick. You have muscle weakness or paralysis. Your symptoms get worse or you experience new symptoms. You find an engorged tick on your skin and you are in an area where there is a higher risk of disease from ticks. Summary Ticks may carry germs that can spread to a person through a bite. These germs can cause disease. Wear protective clothing and use insect repellent to prevent tick bites. Follow the instructions on the label. If you find a tick on your body, remove it as soon as possible. If the tick is attached, do not try to remove it with heat, alcohol, petroleum jelly, or fingernail polish. If you have symptoms of a disease after being bitten by a tick, contact a health care provider. This information is not intended to replace advice given to you by your health care provider. Make sure you discuss any questions you have with your health care provider. Document Revised: 10/20/2021 Document Reviewed: 10/20/2021 Elsevier Patient Education  2024 [employer]  

## 2023-02-19 NOTE — Progress Notes (Signed)
Established Patient Office Visit  Subjective   Patient ID: [name], female    DOB: 08-04-1970  Age: 52 y.o. MRN: [id number]  Chief Complaint  Patient presents with   Tick Bite    Pt states bite occurred July 7th. Pt states having rash and itchiness.     HPI Discussed the use of AI scribe software for clinical note transcription with the patient, who gave verbal consent to proceed.  History of Present Illness   The patient presents with a tick bite and rash that developed after a camping trip. The tick bite was noticed on July 7th, and a rash appeared four days prior to the visit. The rash has a bull's eye appearance, suggestive of Lyme disease. The patient has been taking amoxicillin for tooth pain since the tick bite occurred. They have not noticed any other symptoms related to the tick bite.      Patient Active Problem List   Diagnosis Date Noted   Nausea 01/19/2023   Grief reaction 12/10/2022   Slow transit constipation 11/12/2022   Caregiver stress 10/08/2022   Vitamin D deficiency 08/20/2022   Insulin resistance 08/20/2022   Other fatigue 08/06/2022   SOBOE (shortness of breath on exertion) 08/06/2022   Other hyperlipidemia 08/06/2022   Type 2 diabetes mellitus with other specified complication (HCC) 08/06/2022   OSA (obstructive sleep apnea) 08/06/2022   Essential hypertension 08/06/2022   Depression 08/06/2022   Depression screen 08/06/2022   Controlled diabetes mellitus type 2 with complications (HCC) 01/09/2021   History of benign ovarian tumor 05/24/2019   Personal history of colonic polyps    H/O colonoscopy with polypectomy    Acute lower GI bleeding 02/16/2018   Acute blood loss anemia 02/16/2018   Morbid obesity (HCC) with starting BMI 47 07/07/2013   Hypothyroidism 09/27/2011   Past Medical History:  Diagnosis Date   Anxiety    Benign ovarian tumor 02/2011   17 pound benign tumor- removed   Depression    Diabetes 1.5, managed as type 2 (HCC)     Fatty liver    High cholesterol    Hypertension    Hypothyroidism    Personal history of colonic polyps    Skin cancer    Sleep apnea    Thyroid disease    Vitamin D deficiency    Past Surgical History:  Procedure Laterality Date   ABDOMINAL HYSTERECTOMY  2012   one ovary remains- due to giant benign ovarian cyst (17 lbs)   COLONOSCOPY WITH PROPOFOL N/A 02/16/2018   Procedure: COLONOSCOPY WITH PROPOFOL;  Surgeon: Beverley Fiedler, MD;  Location: [facility];  Service: Gastroenterology;  Laterality: N/A;   OVARIAN CYST REMOVAL     Social History   Tobacco Use   Smoking status: Never   Smokeless tobacco: Never  Vaping Use   Vaping status: Never Used  Substance Use Topics   Alcohol use: No    Alcohol/week: 0.0 standard drinks of alcohol    Comment: socially   Drug use: No   Social History   Socioeconomic History   Marital status: Married    Spouse name: Rosey Bath   Number of children: 1   Years of education: Not on file   Highest education level: Bachelor's degree (e.g., BA, AB, BS)  Occupational History   Occupation: Mental Health Professional  Tobacco Use   Smoking status: Never   Smokeless tobacco: Never  Vaping Use   Vaping status: Never Used  Substance and Sexual Activity  Alcohol use: No    Alcohol/week: 0.0 standard drinks of alcohol    Comment: socially   Drug use: No   Sexual activity: Yes  Other Topics Concern   Not on file  Social History Narrative   Significant other   Education: College   Exercise: Yes      Right handed   One story home    Lives with spouse   Social Determinants of Health   Financial Resource Strain: Low Risk  (12/10/2022)   Overall Financial Resource Strain (CARDIA)    Difficulty of Paying Living Expenses: Not very hard  Food Insecurity: No Food Insecurity (12/10/2022)   Hunger Vital Sign    Worried About Running Out of Food in the Last Year: Never true    Ran Out of Food in the Last Year: Never true  Transportation Needs:  No Transportation Needs (12/10/2022)   PRAPARE - Administrator, Civil Service (Medical): No    Lack of Transportation (Non-Medical): No  Physical Activity: Insufficiently Active (12/10/2022)   Exercise Vital Sign    Days of Exercise per Week: 3 days    Minutes of Exercise per Session: 30 min  Stress: No Stress Concern Present (12/10/2022)   [employer] of Occupational Health - Occupational Stress Questionnaire    Feeling of Stress : Only a little  Social Connections: Socially Integrated (12/10/2022)   Social Connection and Isolation Panel [NHANES]    Frequency of Communication with Friends and Family: More than three times a week    Frequency of Social Gatherings with Friends and Family: Twice a week    Attends Religious Services: More than 4 times per year    Active Member of [employer] or Organizations: Yes    Attends Engineer, structural: More than 4 times per year    Marital Status: Married  Catering manager Violence: Unknown (11/07/2021)   Received from [facility]   HITS    Physically Hurt: Not on file    Insult or Talk Down To: Not on file    Threaten Physical Harm: Not on file    Scream or Curse: Not on file   Family Status  Relation Name Status   Mother  Alive   Father  Alive   Sister  Alive   Sister mat half Alive   Mat Aunt  Alive   Pat Uncle x2 Alive   MGM  Deceased   PGM  Deceased   PGF  Deceased   Son adopted Alive   Neg Hx  (Not Specified)  No partnership data on file   Family History  Problem Relation Age of Onset   Cancer Mother    COPD Mother    Congestive Heart Failure Mother    Lung cancer Mother        highly suspected, waiting on definitive bx 03/14/18   Thyroid disease Father    Heart disease Father    Stroke Father    Heart attack Father    Hyperlipidemia Father    Hypertension Father    Colon polyps Father        benign   Obesity Father    Gallbladder disease Maternal Grandmother    Stroke Paternal Grandmother    Heart  disease Paternal Grandfather    Heart attack Paternal Grandfather    Colon cancer Neg Hx    Stomach cancer Neg Hx    Esophageal cancer Neg Hx    Rectal cancer Neg Hx    Allergies  Allergen Reactions   Levothyroxine Sodium Anxiety and Palpitations   Lisinopril Cough   Levothyroxine Palpitations      Review of Systems  Constitutional:  Negative for fever and malaise/fatigue.  HENT:  Negative for congestion.   Eyes:  Negative for blurred vision.  Respiratory:  Negative for cough and shortness of breath.   Cardiovascular:  Negative for chest pain, palpitations and leg swelling.  Gastrointestinal:  Negative for vomiting.  Musculoskeletal:  Negative for back pain.  Skin:  Positive for itching and rash.  Neurological:  Negative for loss of consciousness and headaches.      Objective:     BP 108/80 (BP Location: Left Arm, Patient Position: Sitting, Cuff Size: Large)   Pulse 80   Temp 98.1 F (36.7 C) (Oral)   Resp 18   Ht 5\' 4"  (1.626 m)   Wt 237 lb 9.6 oz (107.8 kg)   SpO2 96%   BMI 40.78 kg/m  BP Readings from Last 3 Encounters:  02/19/23 108/80  01/19/23 120/82  12/11/22 126/78   Wt Readings from Last 3 Encounters:  02/19/23 237 lb 9.6 oz (107.8 kg)  01/19/23 234 lb (106.1 kg)  12/11/22 244 lb 9.6 oz (110.9 kg)   SpO2 Readings from Last 3 Encounters:  02/19/23 96%  01/19/23 100%  12/11/22 99%      Physical Exam Vitals and nursing note reviewed.  Constitutional:      General: She is not in acute distress.    Appearance: Normal appearance. She is well-developed.  HENT:     Head: Normocephalic and atraumatic.  Eyes:     General: No scleral icterus.       Right eye: No discharge.        Left eye: No discharge.  Cardiovascular:     Rate and Rhythm: Normal rate and regular rhythm.     Heart sounds: No murmur heard. Pulmonary:     Effort: Pulmonary effort is normal. No respiratory distress.     Breath sounds: Normal breath sounds.  Musculoskeletal:         General: Normal range of motion.     Cervical back: Normal range of motion and neck supple.     Right lower leg: No edema.     Left lower leg: No edema.  Skin:    General: Skin is warm and dry.          Comments: Tick bite on back with bullseye rash   Neurological:     Mental Status: She is alert and oriented to person, place, and time.  Psychiatric:        Mood and Affect: Mood normal.        Behavior: Behavior normal.        Thought Content: Thought content normal.        Judgment: Judgment normal.      No results found for any visits on 02/19/23.  Last CBC Lab Results  Component Value Date   WBC 8.2 08/06/2022   HGB 14.1 08/06/2022   HCT 42.1 08/06/2022   MCV 93 08/06/2022   MCH 31.0 08/06/2022   RDW 12.5 08/06/2022   PLT 307 08/06/2022   Last metabolic panel Lab Results  Component Value Date   GLUCOSE 220 (H) 08/06/2022   NA 139 08/06/2022   K 4.5 08/06/2022   CL 100 08/06/2022   CO2 22 08/06/2022   BUN 10 08/06/2022   CREATININE 0.57 08/06/2022   EGFR 109 08/06/2022   CALCIUM 9.4 08/06/2022  PROT 7.2 08/06/2022   ALBUMIN 4.4 08/06/2022   LABGLOB 2.8 08/06/2022   AGRATIO 1.6 08/06/2022   BILITOT 0.5 08/06/2022   ALKPHOS 105 08/06/2022   AST 19 08/06/2022   ALT 24 08/06/2022   ANIONGAP 9 08/29/2019   Last lipids Lab Results  Component Value Date   CHOL 120 01/26/2022   HDL 38.00 (L) 01/26/2022   LDLCALC 49 01/26/2022   LDLDIRECT 118.0 01/08/2021   TRIG 164.0 (H) 01/26/2022   CHOLHDL 3 01/26/2022   Last hemoglobin A1c Lab Results  Component Value Date   HGBA1C 5.6 12/10/2022   Last thyroid functions Lab Results  Component Value Date   TSH 4.83 07/20/2022   Last vitamin D Lab Results  Component Value Date   VD25OH 60.6 12/10/2022   Last vitamin B12 and Folate Lab Results  Component Value Date   VITAMINB12 384 08/06/2022   FOLATE 7.7 08/06/2022      The ASCVD Risk score (Arnett DK, et al., 2019) failed to calculate for the  following reasons:   The valid total cholesterol range is 130 to 320 mg/dL    Assessment & Plan:   Problem List Items Addressed This Visit   None Visit Diagnoses     Tick bite of back, initial encounter    -  Primary   Relevant Medications   doxycycline (VIBRA-TABS) 100 MG tablet   Other Relevant Orders   Lyme Disease Serology w/Reflex   Rocky mtn spotted fvr abs pnl(IgG+IgM)     Assessment and Plan    Tick Bite with Erythema Migrans: Tick bite on July 7th with subsequent development of erythema migrans, suggestive of Lyme disease. No systemic symptoms. Patient was on amoxicillin for tooth pain at the time of the tick bite, but this is not the preferred treatment for Lyme disease. -Start Doxycycline for Lyme disease. -Order Lyme disease and [place] spotted fever serologies.  Dental Pain: Patient was on amoxicillin for tooth pain at the time of the tick bite. -No changes to current treatment plan discussed.  Follow-up: Monitor for worsening symptoms or development of systemic symptoms suggestive of disseminated Lyme disease. If serologies are negative and symptoms develop, repeat testing in a few weeks.        Return if symptoms worsen or fail to improve.    Donato Schultz, DO

## 2023-02-22 ENCOUNTER — Telehealth (INDEPENDENT_AMBULATORY_CARE_PROVIDER_SITE_OTHER): Payer: BC Managed Care – PPO | Admitting: Psychology

## 2023-02-22 ENCOUNTER — Ambulatory Visit: Admission: RE | Admit: 2023-02-22 | Payer: BC Managed Care – PPO | Source: Ambulatory Visit

## 2023-02-22 ENCOUNTER — Ambulatory Visit: Payer: BC Managed Care – PPO

## 2023-02-22 DIAGNOSIS — R928 Other abnormal and inconclusive findings on diagnostic imaging of breast: Secondary | ICD-10-CM

## 2023-02-22 DIAGNOSIS — F4320 Adjustment disorder, unspecified: Secondary | ICD-10-CM

## 2023-02-22 DIAGNOSIS — F5089 Other specified eating disorder: Secondary | ICD-10-CM | POA: Diagnosis not present

## 2023-02-22 NOTE — Progress Notes (Signed)
Office: [phone]  /  Fax: [phone]    Date: February 22, 2023    Appointment Start Time: 9:01am Duration: 39 minutes Provider: Lawerance Cruel, Psy.D. Type of Session: Intake for Individual Therapy  Location of Patient: Home (private location) Location of Provider: Provider's home (private office) Type of Contact: Telepsychological Visit via MyChart Video Visit  Informed Consent: Prior to proceeding with today's appointment, two pieces of identifying information were obtained. In addition, [name]'s physical location at the time of this appointment was obtained as well a phone number she could be reached at in the event of technical difficulties. [name] and this provider participated in today's telepsychological service.   The provider's role was explained to [facility]. The provider reviewed and discussed issues of confidentiality, privacy, and limits therein (e.g., reporting obligations). In addition to verbal informed consent, written informed consent for psychological services was obtained prior to the initial appointment. Since the clinic is not a 24/7 crisis center, mental health emergency resources were shared and this  provider explained MyChart, e-mail, voicemail, and/or other messaging systems should be utilized only for non-emergency reasons. This provider also explained that information obtained during appointments will be placed in [facility] medical record and relevant information will be shared with other providers at [facility] at any locations for coordination of care. [name] agreed information may be shared with other [facility] providers as needed for coordination of care and by signing the service agreement document, she provided written consent for coordination of care. Prior to initiating telepsychological services, [name] completed an informed consent document, which included the development of a safety plan (i.e., an emergency  contact and emergency resources) in the event of an emergency/crisis. [name] verbally acknowledged understanding she is ultimately responsible for understanding her insurance benefits for telepsychological and in-person services. This provider also reviewed confidentiality, as it relates to telepsychological services. [name]  acknowledged understanding that appointments cannot be recorded without both party consent and she is aware she is responsible for securing confidentiality on her end of the session. [name] verbally consented to proceed.  Chief Complaint/HPI: [name] was referred by Dr. Seymour Bars due to  "reactive depression" . Per the note for the visit with Dr. Seymour Bars on 01/19/2023, "She has gone through grief reaction since losing her mom. She has a good support system but has struggled with emotional eating habits in the past and would like to work on more mindful eating habits."   During today's appointment, [name] was verbally administered a questionnaire assessing various behaviors related to emotional eating behaviors. [name] endorsed the following: overeat when you are celebrating, experience food cravings on a regular basis, eat certain foods when you are anxious, stressed, depressed, or your feelings are hurt, use food to help you cope with emotional situations, find food is comforting to you, overeat when you are angry or upset, overeat when you are worried about something, overeat frequently when you are bored or lonely, overeat when you are alone, but eat much less when you are with other people, and eat as a reward. She shared she craves sweets and starches (e.g., chocolate, potatoes). [name] believes the onset of emotional eating behaviors was likely in childhood, and described the current frequency of emotional eating behaviors as "daily." In addition, [name] denied a history of binge eating behaviors. [name] denied a history of significantly restricting food intake,  purging and engagement in other compensatory strategies for weight loss, and has never been diagnosed with an eating disorder. She  also denied a history of treatment for emotional eating behaviors. Currently, [name] indicated she is taking Mounjaro and is following a prescribed structured meal plan (Cat 1), adding she is losing weight. She noted the "food noise has come back" resulting in an increase in [name]. Furthermore, [name] denied other problems of concern.    Mental Status Examination:  Appearance: neat Behavior: appropriate to circumstances Mood: neutral Affect: mood congruent Speech: WNL Eye Contact: appropriate Psychomotor Activity: WNL Gait: unable to assess  Thought Process: linear, logical, and goal directed and denies suicidal, homicidal, and self-harm ideation, plan and intent  Thought Content/Perception: no hallucinations, delusions, bizarre thinking or behavior endorsed or observed Orientation: AAOx4 Memory/Concentration: intact Insight/Judgment: fair  Family & Psychosocial History: [name] reported she is married and she has one son (age 53). 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 Additionally, [name] shared her highest level of education obtained is a bachelor's degree. Currently, [name]'s social support system consists of her wife, son, siblings, father, and step-mother. Moreover, [name] stated she resides with her wife, son, and dog.   Medical History:  Past Medical History:  Diagnosis Date   Anxiety    Benign ovarian tumor 02/2011   17 pound benign tumor- removed   Depression    Diabetes 1.5, managed as type 2 (HCC)    Fatty liver    High cholesterol    Hypertension    Hypothyroidism    Personal history of colonic polyps    Skin cancer    Sleep apnea    Thyroid disease    Vitamin D deficiency    Past Surgical History:  Procedure Laterality Date   ABDOMINAL HYSTERECTOMY   2012   one ovary remains- due to giant benign ovarian cyst (17 lbs)   COLONOSCOPY WITH PROPOFOL N/A 02/16/2018   Procedure: COLONOSCOPY WITH PROPOFOL;  Surgeon: Beverley Fiedler, MD;  Location: [facility];  Service: Gastroenterology;  Laterality: N/A;   OVARIAN CYST REMOVAL     Current Outpatient Medications on File Prior to Visit  Medication Sig Dispense Refill   dapagliflozin propanediol (FARXIGA) 10 MG TABS tablet Take 1 tablet (10 mg total) by mouth daily before breakfast. 90 tablet 3   dicyclomine (BENTYL) 10 MG capsule Take 1 20-30 minutes before breakfast and dinner 60 capsule 3   doxycycline (VIBRA-TABS) 100 MG tablet Take 1 tablet (100 mg total) by mouth 2 (two) times daily. 20 tablet 0   losartan (COZAAR) 25 MG tablet Take 1 tablet (25 mg total) by mouth daily. 90 tablet 3   Melatonin Gummies 2.5 MG CHEW Chew 2.5 mg by mouth at bedtime.      metFORMIN (GLUCOPHAGE) 500 MG tablet TAKE 1 TABLET(500 MG) BY MOUTH TWICE DAILY WITH A MEAL 180 tablet 1   Multiple Vitamins-Minerals (MULTIVITAMIN WITH MINERALS) tablet Take 1 tablet by mouth at bedtime. Reported on 07/24/2015     ondansetron (ZOFRAN-ODT) 8 MG disintegrating tablet Take 1 tablet (8 mg total) by mouth every 8 (eight) hours as needed for nausea or vomiting. 20 tablet 0   Rimegepant Sulfate (NURTEC) 75 MG TBDP Take 75 mg by mouth daily as needed. 16 tablet 5   rosuvastatin (CRESTOR) 20 MG tablet Take 1 tablet (20 mg total) by mouth daily. 90 tablet 3   thyroid (NP THYROID) 30 MG tablet TAKE 3 TABLETS BY MOUTH EVERY DAY BEFORE BREAKFAST 270 tablet 1   tirzepatide (MOUNJARO) 12.5 MG/0.5ML Pen Inject 12.5 mg into the skin  once a week. 2 mL 0   No current facility-administered medications on file prior to visit.  [name] stated she is medication compliant.   Mental Health History: [name] reported she attended therapeutic services around age 24 to address relationship concerns, adding she "intermittently" continued services since  then. She indicated she last attended therapeutic services approximately five years ago. She endorsed a history of an antidepressant approximately 10-15 years. She reported there is no history of hospitalizations for psychiatric concerns. [name] denied a family history of mental health/substance abuse related concerns. Furthermore, [name] reported there is no history of trauma including psychological, physical , and sexual abuse, as well as neglect.   [name] described her typical mood lately as "good." She shared her mother passed away approximately three months ago, adding she was her best friend and caregiver. She discussed "missing her," adding "it was a huge life adjustment" as her mother moved in four years ago. She described working toward establishing her new normal. She noted experiencing crying spells and feeling down. [name] denied current alcohol use. She denied tobacco use. She denied illicit/recreational substance use. Furthermore, [name] indicated she is not experiencing the following: hallucinations and delusions, paranoia, symptoms of mania , social withdrawal, panic attacks, memory concerns, attention and concentration issues, and obsessions and compulsions. She also denied history of and current suicidal ideation, plan, and intent; history of and current homicidal ideation, plan, and intent; and history of and current engagement in self-harm.  Legal History: [name] reported there is no history of legal involvement.   Structured Assessments Results: The Patient Health Questionnaire-9 (PHQ-9) is a self-report measure that assesses symptoms and severity of depression over the course of the last two weeks. Davion obtained a score of 2 suggesting minimal depression. Bay finds the endorsed symptoms to be somewhat difficult. [0= Not at all; 1= Several days; 2= More than half the days; 3= Nearly every day] Little interest or pleasure in doing things 0  Feeling down, depressed,  or hopeless 1  Trouble falling or staying asleep, or sleeping too much 0  Feeling tired or having little energy 0  Poor appetite or overeating 1  Feeling bad about yourself --- or that you are a failure or have let yourself or your family down 0  Trouble concentrating on things, such as reading the newspaper or watching television 0  Moving or speaking so slowly that other people could have noticed? Or the opposite --- being so fidgety or restless that you have been moving around a lot more than usual 0  Thoughts that you would be better off dead or hurting yourself in some way 0  PHQ-9 Score 2    The Generalized Anxiety Disorder-7 (GAD-7) is a brief self-report measure that assesses symptoms of anxiety over the course of the last two weeks. Dia obtained a score of 1 suggesting minimal anxiety. Javona finds the endorsed symptoms to be not difficult at all. [0= Not at all; 1= Several days; 2= Over half the days; 3= Nearly every day] Feeling nervous, anxious, on edge 0  Not being able to stop or control worrying 0  Worrying too much about different things 0  Trouble relaxing 0  Being so restless that it's hard to sit still 0  Becoming easily annoyed or irritable 1  Feeling afraid as if something awful might happen 0  GAD-7 Score 1   Interventions:  Conducted a chart review Focused on rapport building Verbally administered PHQ-9 and GAD-7 for symptom monitoring Verbally administered Food &  Mood questionnaire to assess various behaviors related to emotional eating Provided emphatic reflections and validation Psychoeducation provided regarding physical versus emotional hunger  Diagnostic Impressions & Provisional DSM-5 Diagnosis(es): Chelsi endorsed a history of  engagement in emotional eating behaviors, and believes the onset of emotional eating behaviors was likely in childhood. She described the current frequency of emotional eating behaviors as "daily." She denied engagement in  any other disordered eating behaviors. Additionally, she discussed losing her mother approximately three months ago, adding she is adjusting to not being a caregiver. She also endorsed grief-related symptomatology: crying spells and feeling down. Based on the aforementioned, the following diagnoses were assigned: F50.89 Other Specified Feeding or Eating Disorder, Emotional Eating Behaviors and  F43.20 Adjustment Disorder, Unspecified .  Plan: Maleka appears able and willing to participate as evidenced by engagement in reciprocal conversation and asking questions as needed for clarification. The next appointment is scheduled for 03/22/2023 at 3:30pm, which will be via MyChart Video Visit. The following treatment goal was established: increase coping skills. This provider will regularly review the treatment plan and medical chart to keep informed of status changes. Letecia expressed understanding and agreement with the initial treatment plan of care. Azelie will be sent a handout via e-mail to utilize between now and the next appointment to increase awareness of hunger patterns and subsequent eating. Vangie provided verbal consent during today's appointment for this provider to send the handout via e-mail.

## 2023-02-23 LAB — LYME DISEASE SEROLOGY W/REFLEX: Lyme Total Antibody EIA: NEGATIVE

## 2023-02-24 LAB — ROCKY MTN SPOTTED FVR ABS PNL(IGG+IGM)
RMSF IgG: NOT DETECTED
RMSF IgM: NOT DETECTED

## 2023-03-05 ENCOUNTER — Other Ambulatory Visit: Payer: Self-pay | Admitting: Family Medicine

## 2023-03-05 DIAGNOSIS — E039 Hypothyroidism, unspecified: Secondary | ICD-10-CM

## 2023-03-05 DIAGNOSIS — E119 Type 2 diabetes mellitus without complications: Secondary | ICD-10-CM

## 2023-03-15 ENCOUNTER — Ambulatory Visit (INDEPENDENT_AMBULATORY_CARE_PROVIDER_SITE_OTHER): Payer: BC Managed Care – PPO | Admitting: Family Medicine

## 2023-03-15 ENCOUNTER — Encounter (INDEPENDENT_AMBULATORY_CARE_PROVIDER_SITE_OTHER): Payer: Self-pay | Admitting: Family Medicine

## 2023-03-15 ENCOUNTER — Other Ambulatory Visit (HOSPITAL_COMMUNITY): Payer: Self-pay

## 2023-03-15 VITALS — BP 130/86 | HR 75 | Temp 97.9°F | Ht 64.0 in | Wt 230.0 lb

## 2023-03-15 DIAGNOSIS — R110 Nausea: Secondary | ICD-10-CM

## 2023-03-15 DIAGNOSIS — Z7985 Long-term (current) use of injectable non-insulin antidiabetic drugs: Secondary | ICD-10-CM

## 2023-03-15 DIAGNOSIS — F5089 Other specified eating disorder: Secondary | ICD-10-CM

## 2023-03-15 DIAGNOSIS — Z7984 Long term (current) use of oral hypoglycemic drugs: Secondary | ICD-10-CM

## 2023-03-15 DIAGNOSIS — Z6839 Body mass index (BMI) 39.0-39.9, adult: Secondary | ICD-10-CM

## 2023-03-15 DIAGNOSIS — E118 Type 2 diabetes mellitus with unspecified complications: Secondary | ICD-10-CM

## 2023-03-15 MED ORDER — ONDANSETRON 8 MG PO TBDP
8.0000 mg | ORAL_TABLET | Freq: Three times a day (TID) | ORAL | 0 refills | Status: AC | PRN
Start: 2023-03-15 — End: ?
  Filled 2023-03-15: qty 20, 7d supply, fill #0

## 2023-03-15 MED ORDER — TIRZEPATIDE 12.5 MG/0.5ML ~~LOC~~ SOAJ
12.5000 mg | SUBCUTANEOUS | 0 refills | Status: DC
Start: 2023-03-15 — End: 2023-04-15
  Filled 2023-03-15: qty 2, 28d supply, fill #0

## 2023-03-15 NOTE — Assessment & Plan Note (Signed)
Lab Results  Component Value Date   HGBA1C 5.6 12/10/2022   She has seen remarkable improvements in her diabetes control with an A1c reduction 11.3--> 5.6.  she denies hypoglycemia.  She is doing well with improved satiety on Mounjaro 12.5 mg weekly and remains on metformin 500 mg bid and Farxiga 10 mg qAM with her PCP.  Continue diabetes management thru PCP.  Commended her on her 16.3% TBW loss over the past 8 mos and improved diabetes control.  Continue to work on prescribed dietary plan with tracking of daily steps Continue Mounjaro at 12.5 mg weekly

## 2023-03-15 NOTE — Assessment & Plan Note (Signed)
Nausea is mild in nature and occurs one day following Mounjaro injection without vomiting.  She did not pick up her last RX for Zofran at the pharmacy.  Resent today.

## 2023-03-15 NOTE — Assessment & Plan Note (Signed)
She has struggled more with stress eating/ grief reaction over the past few months since losing her mom in the Spring.  She is working on [employer] triggers out of the house, practicing mindful eating and has started CBT with Dr Dewaine Conger.  We talked about making time for herself for meals and walks.  Continue to work on stress reduction techniques.

## 2023-03-15 NOTE — Progress Notes (Signed)
Office: [phone]  /  Fax: [phone]  WEIGHT SUMMARY AND BIOMETRICS  Starting Date: 07/21/22  Starting Weight: 276lb   Weight Lost Since Last Visit: 4lb   Vitals Temp: 97.9 F (36.6 C) BP: 130/86 Pulse Rate: 75 SpO2: 99 %   Body Composition  Body Fat %: 44.4 % Fat Mass (lbs): 102.2 lbs Muscle Mass (lbs): 121.6 lbs Total Body Water (lbs): 86.8 lbs Visceral Fat Rating : 13     HPI  Chief Complaint: OBESITY  [name] is here to discuss her progress with her obesity treatment plan. She is on the the Category 4 Plan and states she is following her eating plan approximately 75 % of the time. She states she is exercising 30 minutes 4 times per week.   Interval History:  Since last office visit she is down 4 lb She has a net weight loss of 45 lb in the past 8 mos of medically supervised weight management  This is a 16.3% TBW loss Of her total body weight loss, 5.8 lb has been muscle loss which accounts for 12.8% muscle loss since starting her program here She has traveled and is trying to be mindful of her food choices and portion sizes She has started counseling with Dr Dewaine Conger and is scheduled for follow up She has more control over food noise with the higher dose of Mounjaro, now at 12.5 mg for the past 4 weeks.  She has some mild nausea the day after her injection. She is not skipping meals and is prioritizing protein intake Constipation has improved some with metamucil and increased water She reports a good support system She denies low blood sugars She is trying to track her steps and do weights 1 x a week but working longer hours has been a barrier for her  Pharmacotherapy: Mounjaro 12.5 mg weekly  PHYSICAL EXAM:  Blood pressure 130/86, pulse 75, temperature 97.9 F (36.6 C), height 5\' 4"  (1.626 m), weight 230 lb (104.3 kg), SpO2 99%. Body mass index is 39.48 kg/m.  General: She is overweight, cooperative, alert, well developed, and in no acute  distress. PSYCH: Has normal mood, affect and thought process.   Lungs: Normal breathing effort, no conversational dyspnea.   ASSESSMENT AND PLAN  TREATMENT PLAN FOR OBESITY:  Recommended Dietary Goals  [name] is currently in the action stage of change. As such, her goal is to continue weight management plan. She has agreed to the Category 4 Plan.  Behavioral Intervention  We discussed the following Behavioral Modification Strategies today: increasing lean protein intake, decreasing simple carbohydrates , increasing vegetables, increasing lower glycemic fruits, increasing water intake, keeping healthy foods at home, work on managing stress, creating time for self-care and relaxation measures, avoiding temptations and identifying enticing environmental cues, continue to practice mindfulness when eating, and planning for success.  Additional resources provided today: NA  Recommended Physical Activity Goals  [name] has been advised to work up to 150 minutes of moderate intensity aerobic activity a week and strengthening exercises 2-3 times per week for cardiovascular health, weight loss maintenance and preservation of muscle mass.   She has agreed to [employer] strengthening exercises with a goal of 2-3 sessions a week  and Increase physical activity in their day and reduce sedentary time (increase NEAT).  Pharmacotherapy changes for the treatment of obesity: none  ASSOCIATED CONDITIONS ADDRESSED TODAY  Controlled type 2 diabetes mellitus with complication, without long-term current use of insulin [facility]) Assessment & Plan: Lab Results  Component Value Date  HGBA1C 5.6 12/10/2022   She has seen remarkable improvements in her diabetes control with an A1c reduction 11.3--> 5.6.  she denies hypoglycemia.  She is doing well with improved satiety on Mounjaro 12.5 mg weekly and remains on metformin 500 mg bid and Farxiga 10 mg qAM with her PCP.  Continue diabetes management thru PCP.   Commended her on her 16.3% TBW loss over the past 8 mos and improved diabetes control.  Continue to work on prescribed dietary plan with tracking of daily steps Continue Mounjaro at 12.5 mg weekly  Orders: -     Tirzepatide; Inject 12.5 mg into the skin once a week.  Dispense: 2 mL; Refill: 0  Nausea Assessment & Plan: Nausea is mild in nature and occurs one day following Mounjaro injection without vomiting.  She did not pick up her last RX for Zofran at the pharmacy.  Resent today.  Orders: -     Ondansetron; Take 1 tablet (8 mg total) by mouth every 8 (eight) hours as needed for nausea or vomiting.  Dispense: 20 tablet; Refill: 0  Morbid obesity (HCC) with starting BMI 47  BMI 39.0-39.9,adult  Other Specified Feeding or Eating Disorder, Emotional Eating Behaviors Assessment & Plan: She has struggled more with stress eating/ grief reaction over the past few months since losing her mom in the Spring.  She is working on [employer] triggers out of the house, practicing mindful eating and has started CBT with Dr Dewaine Conger.  We talked about making time for herself for meals and walks.  Continue to work on stress reduction techniques.       She was informed of the importance of frequent follow up visits to maximize her success with intensive lifestyle modifications for her multiple health conditions.   ATTESTASTION STATEMENTS:  Reviewed by clinician on day of visit: allergies, medications, problem list, medical history, surgical history, family history, social history, and previous encounter notes pertinent to obesity diagnosis.   I have personally spent 30 minutes total time today in preparation, patient care, nutritional counseling and documentation for this visit, including the following: review of clinical lab tests; review of medical tests/procedures/services.      Sierra Brink, DO DABFM, DABOM [facility] [address] [place], [place]  [id number] [phone]

## 2023-03-16 ENCOUNTER — Other Ambulatory Visit (HOSPITAL_COMMUNITY): Payer: Self-pay

## 2023-03-18 ENCOUNTER — Other Ambulatory Visit (HOSPITAL_COMMUNITY): Payer: Self-pay

## 2023-03-18 DIAGNOSIS — L814 Other melanin hyperpigmentation: Secondary | ICD-10-CM | POA: Diagnosis not present

## 2023-03-18 DIAGNOSIS — L821 Other seborrheic keratosis: Secondary | ICD-10-CM | POA: Diagnosis not present

## 2023-03-18 DIAGNOSIS — L918 Other hypertrophic disorders of the skin: Secondary | ICD-10-CM | POA: Diagnosis not present

## 2023-03-18 DIAGNOSIS — D235 Other benign neoplasm of skin of trunk: Secondary | ICD-10-CM | POA: Diagnosis not present

## 2023-03-22 ENCOUNTER — Telehealth (INDEPENDENT_AMBULATORY_CARE_PROVIDER_SITE_OTHER): Payer: BC Managed Care – PPO | Admitting: Psychology

## 2023-03-22 DIAGNOSIS — F4320 Adjustment disorder, unspecified: Secondary | ICD-10-CM

## 2023-03-22 DIAGNOSIS — F5089 Other specified eating disorder: Secondary | ICD-10-CM | POA: Diagnosis not present

## 2023-03-22 NOTE — Progress Notes (Signed)
  Office: [phone]  /  Fax: [phone]    Date: March 22, 2023  Appointment Start Time: 3:30pm Duration: 25 minutes Provider: Lawerance Cruel, Psy.D. Type of Session: Individual Therapy  Location of Patient: Home (private location) Location of Provider: Provider's Home (private office) Type of Contact: Telepsychological Visit via MyChart Video Visit  Session Content: [name] is a 52 y.o. female presenting for a follow-up appointment to address the previously established treatment goal of increasing coping skills.Today's appointment was a telepsychological visit. [name] provided verbal consent for today's telepsychological appointment and she is aware she is responsible for securing confidentiality on her end of the session. Prior to proceeding with today's appointment, [name]'s physical location at the time of this appointment was obtained as well a phone number she could be reached at in the event of technical difficulties. [name] and this provider participated in today's telepsychological service.   This provider conducted a brief check-in. [name] shared about her recent eating habits. Reviewed emotional and physical hunger. Psychoeducation regarding triggers for emotional eating was provided. [name] was provided a handout, and encouraged to utilize the handout between now and the next appointment to increase awareness of triggers and frequency. [name] agreed. This provider also discussed behavioral strategies for specific triggers, such as placing the utensil down when conversing to avoid mindless eating. [name] provided verbal consent during today's appointment for this provider to send a handout about triggers via e-mail. Of note, [name] expressed concern regarding the diagnosis of Adjustment Disorder. Associated thoughts and feelings were processed, and this provider explained her rationale. [name] acknowledged understanding. Overall, [name] was receptive to today's  appointment as evidenced by openness to sharing, responsiveness to feedback, and willingness to explore triggers for emotional eating.  Mental Status Examination:  Appearance: neat Behavior: appropriate to circumstances Mood: neutral Affect: mood congruent Speech: WNL Eye Contact: appropriate Psychomotor Activity: WNL Gait: unable to assess Thought Process: linear, logical, and goal directed and no evidence or endorsement of suicidal, homicidal, and self-harm ideation, plan and intent  Thought Content/Perception: no hallucinations, delusions, bizarre thinking or behavior endorsed or observed Orientation: AAOx4 Memory/Concentration: memory, attention, language, and fund of knowledge intact  Insight: good Judgment: good  Interventions:  Conducted a brief chart review Provided empathic reflections and validation Reviewed content from the previous session Provided positive reinforcement Employed supportive psychotherapy interventions to facilitate reduced distress and to improve coping skills with identified stressors Psychoeducation provided regarding triggers for emotional eating behaviors  DSM-5 Diagnosis(es): F50.89 Other Specified Feeding or Eating Disorder, Emotional Eating Behaviors and  F43.20 Adjustment Disorder, Unspecified  Treatment Goal & Progress: During the initial appointment with this provider, the following treatment goal was established: increase coping skills. [name] has demonstrated progress in her goal as evidenced by increased awareness of hunger patterns.   Plan: The next appointment is scheduled for 04/26/2023 at 11:30am, which will be via MyChart Video Visit. The next session will focus on working towards the established treatment goal.

## 2023-04-09 DIAGNOSIS — G4733 Obstructive sleep apnea (adult) (pediatric): Secondary | ICD-10-CM | POA: Diagnosis not present

## 2023-04-12 ENCOUNTER — Encounter: Payer: Self-pay | Admitting: Physician Assistant

## 2023-04-12 ENCOUNTER — Ambulatory Visit: Payer: BC Managed Care – PPO | Admitting: Physician Assistant

## 2023-04-12 VITALS — BP 128/80 | HR 74 | Temp 98.3°F | Wt 233.0 lb

## 2023-04-12 DIAGNOSIS — G4733 Obstructive sleep apnea (adult) (pediatric): Secondary | ICD-10-CM | POA: Diagnosis not present

## 2023-04-12 DIAGNOSIS — G8929 Other chronic pain: Secondary | ICD-10-CM

## 2023-04-12 DIAGNOSIS — M546 Pain in thoracic spine: Secondary | ICD-10-CM | POA: Diagnosis not present

## 2023-04-12 DIAGNOSIS — R202 Paresthesia of skin: Secondary | ICD-10-CM | POA: Diagnosis not present

## 2023-04-12 NOTE — Progress Notes (Signed)
Established patient visit   Patient: [name]   DOB: 07-02-1971   52 y.o. Female  MRN: [id number] Visit Date: 04/12/2023  Today's healthcare provider: Alfredia Ferguson, PA-C   Chief Complaint  Patient presents with   back issues    Patient states numbness in mid back to left    Subjective    HPI Discussed the use of AI scribe software for clinical note transcription with the patient, who gave verbal consent to proceed.  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 The numbness, which has been ongoing for a few months, is localized to one spot and is not associated with any pain, shortness of breath, or cough. Initially she had pain in this area, but the pain had resolved. The patient initially thought the numbness was due to a lidocaine patch, but the sensation persisted even after the patch was removed. The back pain was resolved with medication, but the numbness persisted.      The patient also reports having sleep apnea and has not seen a doctor for this condition in years. She currently uses a CPAP machine but has been told that it is not working well, that she is still snoring. She would like a referral to pulmonology.      Medications: Outpatient Medications Prior to Visit  Medication Sig   dapagliflozin propanediol (FARXIGA) 10 MG TABS tablet Take 1 tablet (10 mg total) by mouth daily before breakfast.   dicyclomine (BENTYL) 10 MG capsule Take 1 20-30 minutes before breakfast and dinner   losartan (COZAAR) 25 MG tablet Take 1 tablet (25 mg total) by mouth daily.   Melatonin Gummies 2.5 MG CHEW Chew 2.5 mg by mouth at bedtime.    metFORMIN (GLUCOPHAGE) 500 MG tablet Take 1 tablet (500 mg total) by mouth 2 (two) times daily with a meal.   Multiple Vitamins-Minerals (MULTIVITAMIN WITH MINERALS) tablet Take 1 tablet by mouth at bedtime. Reported on  07/24/2015   ondansetron (ZOFRAN-ODT) 8 MG disintegrating tablet Take 1 tablet (8 mg total) by mouth every 8 (eight) hours as needed for nausea or vomiting.   Rimegepant Sulfate (NURTEC) 75 MG TBDP Take 75 mg by mouth daily as needed.   rosuvastatin (CRESTOR) 20 MG tablet Take 1 tablet (20 mg total) by mouth daily.   thyroid (NP THYROID) 30 MG tablet Take 3 tablets (90 mg total) by mouth daily before breakfast.   tirzepatide (MOUNJARO) 12.5 MG/0.5ML Pen Inject 12.5 mg into the skin once a week.   doxycycline (VIBRA-TABS) 100 MG tablet Take 1 tablet (100 mg total) by mouth 2 (two) times daily. (Patient not taking: Reported on 04/12/2023)   No facility-administered medications prior to visit.    Review of Systems  Constitutional:  Negative for fatigue and fever.  Respiratory:  Negative for cough and shortness of breath.   Cardiovascular:  Negative for chest pain and leg swelling.  Gastrointestinal:  Negative for abdominal pain.  Musculoskeletal:  Positive for back pain.  Neurological:  Positive for numbness. Negative for dizziness and headaches.      Objective    BP 128/80 (BP Location: Left Arm, Patient Position: Sitting, Cuff Size: Normal)   Pulse 74   Temp 98.3 F (36.8 C) (Oral)   Wt 233 lb (105.7 kg)   SpO2 98%   BMI 39.99 kg/m   Physical Exam Vitals reviewed.  Constitutional:      Appearance: She is not ill-appearing.  HENT:     Head: Normocephalic.  Eyes:     Conjunctiva/sclera: Conjunctivae normal.  Cardiovascular:     Rate and Rhythm: Normal rate.  Pulmonary:     Effort: Pulmonary effort is normal. No respiratory distress.  Skin:    Comments: No rashes/ erythema/edema area of numbness.  Neurological:     General: No focal deficit present.     Mental Status: She is alert and oriented to person, place, and time.  Psychiatric:        Mood and Affect: Mood normal.        Behavior: Behavior normal.      No results found for any visits on 04/12/23.  Assessment &  Plan     1. Paresthesia 2. Chronic left-sided thoracic back pain -reviewed xrays from 5/24 -Referral to orthopedics for further evaluation. -Encouraged to perform general chest/thoracic spine stretches, demonstrated a few - AMB referral to orthopedics  3. OSA (obstructive sleep apnea) Patient has a history of sleep apnea and is currently using a CPAP machine. Reports snoring through the machine, suggesting possible need for adjustment of settings. -Referral to pulm  for further management. -Encouraged to connect CPAP machine to phone app for feedback on usage and potential leaks.  - Ambulatory referral to Pulmonology        Return if symptoms worsen or fail to improve.      I, Alfredia Ferguson, PA-C have reviewed all documentation for this visit. The documentation on  04/12/23   for the exam, diagnosis, procedures, and orders are all accurate and complete.    Alfredia Ferguson, PA-C  [facility] at [facility] [phone] (phone) [phone] (fax)  [facility]

## 2023-04-15 ENCOUNTER — Other Ambulatory Visit (HOSPITAL_COMMUNITY): Payer: Self-pay

## 2023-04-15 ENCOUNTER — Encounter (INDEPENDENT_AMBULATORY_CARE_PROVIDER_SITE_OTHER): Payer: Self-pay | Admitting: Family Medicine

## 2023-04-15 ENCOUNTER — Ambulatory Visit (INDEPENDENT_AMBULATORY_CARE_PROVIDER_SITE_OTHER): Payer: BC Managed Care – PPO | Admitting: Family Medicine

## 2023-04-15 VITALS — BP 109/76 | HR 77 | Temp 97.8°F | Ht 64.0 in | Wt 227.0 lb

## 2023-04-15 DIAGNOSIS — E669 Obesity, unspecified: Secondary | ICD-10-CM

## 2023-04-15 DIAGNOSIS — K5909 Other constipation: Secondary | ICD-10-CM

## 2023-04-15 DIAGNOSIS — Z7985 Long-term (current) use of injectable non-insulin antidiabetic drugs: Secondary | ICD-10-CM

## 2023-04-15 DIAGNOSIS — I10 Essential (primary) hypertension: Secondary | ICD-10-CM

## 2023-04-15 DIAGNOSIS — K5900 Constipation, unspecified: Secondary | ICD-10-CM | POA: Diagnosis not present

## 2023-04-15 DIAGNOSIS — I959 Hypotension, unspecified: Secondary | ICD-10-CM | POA: Diagnosis not present

## 2023-04-15 DIAGNOSIS — Z6839 Body mass index (BMI) 39.0-39.9, adult: Secondary | ICD-10-CM

## 2023-04-15 DIAGNOSIS — Z7984 Long term (current) use of oral hypoglycemic drugs: Secondary | ICD-10-CM

## 2023-04-15 DIAGNOSIS — E118 Type 2 diabetes mellitus with unspecified complications: Secondary | ICD-10-CM | POA: Diagnosis not present

## 2023-04-15 DIAGNOSIS — Z6838 Body mass index (BMI) 38.0-38.9, adult: Secondary | ICD-10-CM | POA: Insufficient documentation

## 2023-04-15 MED ORDER — TIRZEPATIDE 12.5 MG/0.5ML ~~LOC~~ SOAJ
12.5000 mg | SUBCUTANEOUS | 0 refills | Status: DC
Start: 2023-04-15 — End: 2023-05-13
  Filled 2023-04-15: qty 2, 28d supply, fill #0

## 2023-04-15 NOTE — Progress Notes (Signed)
.smr  Office: [phone]  /  Fax: [phone]  WEIGHT SUMMARY AND BIOMETRICS  Anthropometric Measurements Height: 5\' 4"  (1.626 m) Weight: 227 lb (103 kg) BMI (Calculated): 38.95 Weight at Last Visit: 230 lb Weight Lost Since Last Visit: 3 lb Weight Gained Since Last Visit: 0 Starting Weight: 276 lb Total Weight Loss (lbs): 49 lb (22.2 kg)   Body Composition  Body Fat %: 44.2 % Fat Mass (lbs): 100.4 lbs Muscle Mass (lbs): 120.2 lbs Total Body Water (lbs): 85.2 lbs Visceral Fat Rating : 13   Other Clinical Data Fasting: No Labs: No Today's Visit #: 9 Starting Date: 07/21/22    Chief Complaint: OBESITY  History of Present Illness   The patient, with a history of diabetes and obesity, presents for a follow-up visit. She has been on [facility] for diabetes management, which has also aided in weight loss. Over the past month, she has lost three pounds by adhering to the category four plan approximately 60% of the time. She has been engaging in walking and stretching exercises for 45 minutes, three times a week.  The patient also takes metformin for diabetes. Her most recent hemoglobin A1c, taken on May 9th, was 5.6, a significant improvement from a year ago when it was 11.3. She reports feeling much better overall, although she notes some discomfort in her ankles during exercise.  The patient has not experienced any problems with Mounjaro. Previously, she was on Trulicity, but her A1c kept increasing. Since starting Mounjaro, her A1c has been decreasing. She has experienced constipation, which has been managed with Metamucil. She does not report any nausea.  The patient has noticed occasional lightheadedness and dizziness. Her blood pressure was recorded as 109/76, which is lower than her usual readings of around 120/80. She is currently on losartan for blood pressure control.  The patient admits to not hydrating as well as she should. She has recently started drinking Diet  Coke, which she is trying to limit to one can a day. She also consumes a [employer] protein shake in the morning.  The patient has had a hysterectomy in the past, which she believes has contributed to her weight gain. She is concerned about her upcoming change in insurance from [employer] to [employer], particularly regarding coverage for [employer].          PHYSICAL EXAM:  Blood pressure 109/76, pulse 77, temperature 97.8 F (36.6 C), height 5\' 4"  (1.626 m), weight 227 lb (103 kg), SpO2 96%. Body mass index is 38.96 kg/m.  DIAGNOSTIC DATA REVIEWED:  BMET    Component Value Date/Time   NA 139 08/06/2022 0855   K 4.5 08/06/2022 0855   CL 100 08/06/2022 0855   CO2 22 08/06/2022 0855   GLUCOSE 220 (H) 08/06/2022 0855   GLUCOSE 176 (H) 07/20/2022 1334   BUN 10 08/06/2022 0855   CREATININE 0.57 08/06/2022 0855   CREATININE 0.60 05/26/2019 1616   CALCIUM 9.4 08/06/2022 0855   GFRNONAA >60 08/29/2019 1150   GFRNONAA 108 05/26/2019 1616   GFRAA >60 08/29/2019 1150   GFRAA 125 05/26/2019 1616   Lab Results  Component Value Date   HGBA1C 5.6 12/10/2022   HGBA1C 5.6 07/24/2015   Lab Results  Component Value Date   INSULIN 27.4 (H) 12/10/2022   INSULIN 50.6 (H) 08/06/2022   Lab Results  Component Value Date   TSH 4.83 07/20/2022   CBC    Component Value Date/Time   WBC 8.2 08/06/2022 0855   WBC  7.4 01/26/2022 0914   RBC 4.55 08/06/2022 0855   RBC 4.38 01/26/2022 0914   HGB 14.1 08/06/2022 0855   HCT 42.1 08/06/2022 0855   PLT 307 08/06/2022 0855   MCV 93 08/06/2022 0855   MCH 31.0 08/06/2022 0855   MCH 31.1 08/29/2019 1150   MCHC 33.5 08/06/2022 0855   MCHC 33.9 01/26/2022 0914   RDW 12.5 08/06/2022 0855   Iron Studies    Component Value Date/Time   FERRITIN 51.1 06/15/2016 1218   Lipid Panel     Component Value Date/Time   CHOL 120 01/26/2022 0914   CHOL 153 02/06/2020 0947   TRIG 164.0 (H) 01/26/2022 0914   HDL 38.00 (L) 01/26/2022 0914   HDL  36 (L) 02/06/2020 0947   CHOLHDL 3 01/26/2022 0914   VLDL 32.8 01/26/2022 0914   LDLCALC 49 01/26/2022 0914   LDLCALC 88 02/06/2020 0947   LDLDIRECT 118.0 01/08/2021 1438   Hepatic Function Panel     Component Value Date/Time   PROT 7.2 08/06/2022 0855   ALBUMIN 4.4 08/06/2022 0855   AST 19 08/06/2022 0855   ALT 24 08/06/2022 0855   ALKPHOS 105 08/06/2022 0855   BILITOT 0.5 08/06/2022 0855   BILIDIR 0.10 02/06/2020 0947      Component Value Date/Time   TSH 4.83 07/20/2022 1334   Nutritional Lab Results  Component Value Date   VD25OH 60.6 12/10/2022   VD25OH 20.1 (L) 08/06/2022   VD25OH 18.31 (L) 04/24/2021     Assessment and Plan    Type 2 Diabetes Mellitus Improved control with A1c of 5.6 (down from 11.3 a year ago) on Mounjaro and Metformin. No issues with Mounjaro reported, except for mild constipation managed with Metamucil. -Continue Mounjaro and Metformin. -Refill Mounjaro prescription and send to [facility] pharmacy.   Constipation Patient reports not hydrating as well as she should, primarily consuming Diet Coke and water. -Encourage increased hydration with calorie-free and caffeine-free beverages.  -Continue Metamucil   Obesity Weight loss of 3 pounds in the last month with adherence to category four plan approximately 60% of the time and regular exercise (walking and stretching for 45 minutes 3 times per week). -Continue current diet and exercise regimen.  Hypotension Blood pressure lower than usual. Patient reports occasional lightheadedness. -Reduce Losartan dose to half of the current 25mg  daily. -Monitor for symptoms of lightheadedness.     Follow-up Schedule appointments for the next two visits in four weeks and eight weeks. If any issues or concerns arise, patient to send a MyChart message.       She was informed of the importance of frequent follow up visits to maximize her success with intensive lifestyle modifications for her multiple  health conditions.    Quillian Quince, MD

## 2023-04-19 ENCOUNTER — Ambulatory Visit (INDEPENDENT_AMBULATORY_CARE_PROVIDER_SITE_OTHER): Payer: BC Managed Care – PPO | Admitting: Physical Medicine and Rehabilitation

## 2023-04-19 ENCOUNTER — Encounter: Payer: Self-pay | Admitting: Physical Medicine and Rehabilitation

## 2023-04-19 DIAGNOSIS — R202 Paresthesia of skin: Secondary | ICD-10-CM

## 2023-04-19 DIAGNOSIS — G8929 Other chronic pain: Secondary | ICD-10-CM | POA: Diagnosis not present

## 2023-04-19 DIAGNOSIS — M546 Pain in thoracic spine: Secondary | ICD-10-CM

## 2023-04-19 NOTE — Progress Notes (Unsigned)
[name] - 52 y.o. female MRN [id number]  Date of birth: Nov 24, 1970  Office Visit Note: Visit Date: 04/19/2023 PCP: Pearline Cables, MD Referred by: Alfredia Ferguson, PA-C  Subjective: Chief Complaint  Patient presents with   Middle Back - Pain   HPI: [name] is a 52 y.o. female who comes in today per the request of Alfredia Ferguson, PA for evaluation of chronic left sided thoracic back numbness, occasional radiation of pain up her back. Pain started about 3 months ago after the passing of her mother. She reports previously issues with pain to entire back as she did help care for her mother. States lower back pain has resolved. Symptoms are intermittent, no specific aggravating factors. She describes symptoms are numbness and feeling if she has pain patch on there back. States her symptoms are very mild today. Recent thoracic x-rays show normal alignment, no spondylolisthesis, well preserved disc spacing, no fractures. Some relief of pain with stretching exercises at home. No history of formal physical therapy. Patient denies focal weakness. No recent trauma or falls.   Patients course is complicated by diabetes mellitus, morbid obesity, emotional eating disorder.    Oswestry Disability Index Score 12% 0 to 10 (20%)   minimal disability: The patient can cope with most living activities. Usually no treatment is indicated apart from advice on lifting sitting and exercise.  Review of Systems  Musculoskeletal:        Numbness to left thoracic spine.   Neurological:  Negative for tingling, sensory change, focal weakness and weakness.  All other systems reviewed and are negative.  Otherwise per HPI.  Assessment & Plan: Visit Diagnoses:    ICD-10-CM   1. Chronic left-sided thoracic back pain  M54.6 Ambulatory referral to Physical Therapy   G89.29 MR THORACIC SPINE WO CONTRAST    2. Paresthesia of skin  R20.2 Ambulatory referral to Physical Therapy    MR THORACIC SPINE WO  CONTRAST       Plan: Findings:  Chronic left sided thoracic back numbness, occasional radiation of pain up her back. Patient continues to have paresthesias despite good conservative therapies such as home exercise regimen and rest. Patients clinical presentation and exam are complex, differentials include thoracic radiculopathy vs myofascial pain. I also feel this could be a central sensitization syndrome such as fibromyalgia. Could be more of a stress response, she continues to suffer with grief after her mothers passing. Recent thoracic x-rays look good, no nerve impingement. We discussed treatment plan in detail today. I placed order for formal physical therapy, I do feel she would benefit from physical activity, manual treatments and core strengthening. I also placed order for thoracic MRI imaging to rule out any concerning findings. Patient has no questions at this time. I will see her back for thoracic MRI review. No red flag symptoms noted upon exam today.     Meds & Orders: No orders of the defined types were placed in this encounter.   Orders Placed This Encounter  Procedures   MR THORACIC SPINE WO CONTRAST   Ambulatory referral to Physical Therapy    Follow-up: Return if symptoms worsen or fail to improve.   Procedures: No procedures performed      Clinical History: No specialty comments available.   She reports that she has never smoked. She has never used smokeless tobacco.  Recent Labs    07/20/22 1334 12/10/22 0917  HGBA1C 11.3* 5.6    Objective:  VS:  HT:    WT:  BMI:     BP:   HR: bpm  TEMP: ( )  RESP:  Physical Exam Vitals and nursing note reviewed.  HENT:     Head: Normocephalic and atraumatic.     Right Ear: External ear normal.     Left Ear: External ear normal.     Nose: Nose normal.     Mouth/Throat:     Mouth: Mucous membranes are moist.  Eyes:     Extraocular Movements: Extraocular movements intact.  Cardiovascular:     Rate and Rhythm:  Normal rate.     Pulses: Normal pulses.  Pulmonary:     Effort: Pulmonary effort is normal.  Abdominal:     General: Abdomen is flat. There is no distension.  Musculoskeletal:        General: Tenderness present.     Cervical back: Normal range of motion.     Comments: Normal thoracic kyphosis noted. No tenderness noted upon palpation of spinous processes. No step off deformity. No pain noted with flexion, extension, lateral bending and rotation. No palpable trigger points noted. Good strength noted to bilateral upper extremities.    Skin:    General: Skin is warm and dry.     Capillary Refill: Capillary refill takes less than 2 seconds.  Neurological:     General: No focal deficit present.     Mental Status: She is alert and oriented to person, place, and time.  Psychiatric:        Mood and Affect: Mood normal.        Behavior: Behavior normal.     Ortho Exam  Imaging: No results found.  Past Medical/Family/Surgical/Social History: Medications & Allergies reviewed per EMR, new medications updated. Patient Active Problem List   Diagnosis Date Noted   BMI 39.0-39.9,adult 04/15/2023   Other Specified Feeding or Eating Disorder, Emotional Eating Behaviors 03/15/2023   Nausea 01/19/2023   Grief reaction 12/10/2022   Other constipation 11/12/2022   Caregiver stress 10/08/2022   Vitamin D deficiency 08/20/2022   Other fatigue 08/06/2022   SOBOE (shortness of breath on exertion) 08/06/2022   Other hyperlipidemia 08/06/2022   Controlled type 2 diabetes mellitus with complication, without long-term current use of insulin (HCC) 08/06/2022   OSA (obstructive sleep apnea) 08/06/2022   Essential hypertension 08/06/2022   Depression 08/06/2022   History of benign ovarian tumor 05/24/2019   Personal history of colonic polyps    H/O colonoscopy with polypectomy    Acute lower GI bleeding 02/16/2018   Acute blood loss anemia 02/16/2018   Morbid obesity (HCC) with starting BMI 47  07/07/2013   Hypothyroidism 09/27/2011   Past Medical History:  Diagnosis Date   Anxiety    Benign ovarian tumor 02/2011   17 pound benign tumor- removed   Depression    Diabetes 1.5, managed as type 2 (HCC)    Fatty liver    High cholesterol    Hypertension    Hypothyroidism    Personal history of colonic polyps    Skin cancer    Sleep apnea    Thyroid disease    Vitamin D deficiency    Family History  Problem Relation Age of Onset   Cancer Mother    COPD Mother    Congestive Heart Failure Mother    Lung cancer Mother        highly suspected, waiting on definitive bx 03/14/18   Thyroid disease Father    Heart disease Father    Stroke Father  Heart attack Father    Hyperlipidemia Father    Hypertension Father    Colon polyps Father        benign   Obesity Father    Gallbladder disease Maternal Grandmother    Stroke Paternal Grandmother    Heart disease Paternal Grandfather    Heart attack Paternal Grandfather    Colon cancer Neg Hx    Stomach cancer Neg Hx    Esophageal cancer Neg Hx    Rectal cancer Neg Hx    Past Surgical History:  Procedure Laterality Date   ABDOMINAL HYSTERECTOMY  2012   one ovary remains- due to giant benign ovarian cyst (17 lbs)   COLONOSCOPY WITH PROPOFOL N/A 02/16/2018   Procedure: COLONOSCOPY WITH PROPOFOL;  Surgeon: Beverley Fiedler, MD;  Location: [facility];  Service: Gastroenterology;  Laterality: N/A;   OVARIAN CYST REMOVAL     Social History   Occupational History   Occupation: Financial trader  Tobacco Use   Smoking status: Never   Smokeless tobacco: Never  Vaping Use   Vaping status: Never Used  Substance and Sexual Activity   Alcohol use: No    Alcohol/week: 0.0 standard drinks of alcohol    Comment: socially   Drug use: No   Sexual activity: Yes

## 2023-04-19 NOTE — Progress Notes (Unsigned)
More numbness than pain. Extending into middle to upper back. Not really "pain" . 3 months its been like this.  No injury other than lifting mother who was in a wheelchair.  Numbness is on the L side.  She isn't taking anything since its not painful it just feels weird

## 2023-04-26 ENCOUNTER — Telehealth (INDEPENDENT_AMBULATORY_CARE_PROVIDER_SITE_OTHER): Payer: BC Managed Care – PPO | Admitting: Psychology

## 2023-04-26 DIAGNOSIS — F5089 Other specified eating disorder: Secondary | ICD-10-CM

## 2023-04-26 DIAGNOSIS — F4320 Adjustment disorder, unspecified: Secondary | ICD-10-CM | POA: Diagnosis not present

## 2023-04-26 NOTE — Progress Notes (Signed)
  Office: [phone]  /  Fax: [phone]    Date: April 26, 2023  Appointment Start Time: 11:29am Duration: 30 minutes Provider: Lawerance Cruel, Psy.D. Type of Session: Individual Therapy  Location of Patient: Home (private location) Location of Provider: Provider's Home (private office) Type of Contact: Telepsychological Visit via MyChart Video Visit  Session Content: [name] is a 52 y.o. female presenting for a follow-up appointment to address the previously established treatment goal of increasing coping skills.Today's appointment was a telepsychological visit. [name] provided verbal consent for today's telepsychological appointment and she is aware she is responsible for securing confidentiality on her end of the session. Prior to proceeding with today's appointment, [name]'s physical location at the time of this appointment was obtained as well a phone number she could be reached at in the event of technical difficulties. [name] and this provider participated in today's telepsychological service.   This provider conducted a brief check-in. [name] discussed reflection on triggers for emotional eating behaviors, noting an improvement. More specifically, she reported she is "pausing" prior to making a choice due to increased awareness. She shared various examples. Positive reinforcement was provided. Notably, she discussed challenges with out of habit and social influences as triggers. Psychoeducation regarding making better choices and engaging in portion control during the holidays/celebrations/vacations was provided. More specifically, this provider discussed the following strategies: coming to meals hungry, but not starving; avoid filling up on appetizers; managing portion sizes; not completely depriving yourself; making the plate colorful (e.g., vegetables); pacing yourself (e.g., waiting 10 minutes before going back for seconds); taking advantage of the nutritious foods;  practicing mindfulness; staying hydrated; and avoid bringing home leftovers. Overall, [name] was receptive to today's appointment as evidenced by openness to sharing, responsiveness to feedback, and willingness to implement discussed strategies .  Mental Status Examination:  Appearance: neat Behavior: appropriate to circumstances Mood: neutral Affect: mood congruent Speech: WNL Eye Contact: appropriate Psychomotor Activity: WNL Gait: unable to assess Thought Process: linear, logical, and goal directed and no evidence or endorsement of suicidal, homicidal, and self-harm ideation, plan and intent  Thought Content/Perception: no hallucinations, delusions, bizarre thinking or behavior endorsed or observed Orientation: AAOx4 Memory/Concentration: memory, attention, language, and fund of knowledge intact  Insight: good Judgment: good  Interventions:  Conducted a brief chart review Provided empathic reflections and validation Reviewed content from the previous session Provided positive reinforcement Employed supportive psychotherapy interventions to facilitate reduced distress and to improve coping skills with identified stressors Psychoeducation provided regarding strategies for celebrations/holidays/vacations  DSM-5 Diagnosis(es):  F50.89 Other Specified Feeding or Eating Disorder, Emotional Eating Behaviors and  F43.20 Adjustment Disorder, Unspecified  Treatment Goal & Progress: During the initial appointment with this provider, the following treatment goal was established: increase coping skills. [name] has demonstrated progress in her goal as evidenced by increased awareness of hunger patterns, increased awareness of triggers for emotional eating behaviors, and reduction in emotional eating behaviors . [name] also continues to demonstrate willingness to engage in learned skill(s).  Plan: The next appointment is scheduled for 05/18/2023 at 8am, which will be via MyChart Video Visit.  The next session will focus on working towards the established treatment goal.

## 2023-04-29 ENCOUNTER — Ambulatory Visit: Payer: BC Managed Care – PPO | Admitting: Physical Therapy

## 2023-05-10 ENCOUNTER — Encounter: Payer: Self-pay | Admitting: Physical Medicine and Rehabilitation

## 2023-05-13 ENCOUNTER — Ambulatory Visit (INDEPENDENT_AMBULATORY_CARE_PROVIDER_SITE_OTHER): Payer: No Typology Code available for payment source | Admitting: Family Medicine

## 2023-05-13 ENCOUNTER — Encounter (INDEPENDENT_AMBULATORY_CARE_PROVIDER_SITE_OTHER): Payer: Self-pay | Admitting: Family Medicine

## 2023-05-13 ENCOUNTER — Other Ambulatory Visit (HOSPITAL_COMMUNITY): Payer: Self-pay

## 2023-05-13 ENCOUNTER — Telehealth (INDEPENDENT_AMBULATORY_CARE_PROVIDER_SITE_OTHER): Payer: Self-pay | Admitting: *Deleted

## 2023-05-13 VITALS — BP 127/83 | HR 79 | Temp 98.0°F | Ht 64.0 in | Wt 227.0 lb

## 2023-05-13 DIAGNOSIS — E7849 Other hyperlipidemia: Secondary | ICD-10-CM

## 2023-05-13 DIAGNOSIS — E669 Obesity, unspecified: Secondary | ICD-10-CM

## 2023-05-13 DIAGNOSIS — Z7985 Long-term (current) use of injectable non-insulin antidiabetic drugs: Secondary | ICD-10-CM

## 2023-05-13 DIAGNOSIS — E559 Vitamin D deficiency, unspecified: Secondary | ICD-10-CM | POA: Diagnosis not present

## 2023-05-13 DIAGNOSIS — Z6838 Body mass index (BMI) 38.0-38.9, adult: Secondary | ICD-10-CM

## 2023-05-13 DIAGNOSIS — E039 Hypothyroidism, unspecified: Secondary | ICD-10-CM

## 2023-05-13 DIAGNOSIS — E785 Hyperlipidemia, unspecified: Secondary | ICD-10-CM

## 2023-05-13 DIAGNOSIS — E118 Type 2 diabetes mellitus with unspecified complications: Secondary | ICD-10-CM | POA: Diagnosis not present

## 2023-05-13 DIAGNOSIS — Z6839 Body mass index (BMI) 39.0-39.9, adult: Secondary | ICD-10-CM

## 2023-05-13 DIAGNOSIS — Z7984 Long term (current) use of oral hypoglycemic drugs: Secondary | ICD-10-CM

## 2023-05-13 MED ORDER — TIRZEPATIDE 12.5 MG/0.5ML ~~LOC~~ SOAJ
12.5000 mg | SUBCUTANEOUS | 0 refills | Status: DC
Start: 2023-05-13 — End: 2023-06-15
  Filled 2023-05-13 – 2023-05-14 (×3): qty 2, 28d supply, fill #0

## 2023-05-13 NOTE — Telephone Encounter (Signed)
PA SUBMIMTTED VIA [employer].  [name] (Key: [id number])  [employer] has not yet replied to your PA request. You may close this dialog, return to your dashboard, and perform other tasks.  To check for an update later, open this request again from your dashboard.  If [employer] has not replied to your request within 72 hours please contact [employer] at [phone].

## 2023-05-13 NOTE — Progress Notes (Signed)
[facility]  Office: [phone]  /  Fax: [phone]  WEIGHT SUMMARY AND BIOMETRICS  Anthropometric Measurements Height: 5\' 4"  (1.626 m) Weight: 227 lb (103 kg) BMI (Calculated): 38.95 Weight at Last Visit: 227 lb Weight Lost Since Last Visit: 0 Weight Gained Since Last Visit: 0 Starting Weight: 276 lb Total Weight Loss (lbs): 49 lb (22.2 kg)   Body Composition  Body Fat %: 45 % Fat Mass (lbs): 102.4 lbs Muscle Mass (lbs): 118.8 lbs Total Body Water (lbs): 86.6 lbs Visceral Fat Rating : 13   Other Clinical Data Fasting: No Labs: No Today's Visit #: 10 Starting Date: 07/21/22    Chief Complaint: OBESITY  History of Present Illness   The patient, diagnosed with type two diabetes and obesity, has been managing her conditions with Mounjaro and metformin. She has been focusing on diet and exercise to control her blood sugar and aid in weight loss. Despite these efforts, her weight has remained stable over the past month. She reports adherence to her category four eating plan approximately 50% of the time and engages in walking for exercise about twenty minutes three times per week.  The patient has faced significant challenges in the past month due to home construction issues, which have limited her access to cooking facilities and disrupted her usual dietary routine. Despite these obstacles, she has managed to maintain her weight, which she attributes to her mindfulness and avoidance of fast food, a previous coping mechanism for stress.  The patient reports no gastrointestinal issues with [facility] [place] and feels she is able to eat everything she should. She has been trying to meet her protein intake through sources like yogurt and cottage cheese due to difficulties in cooking meat. She has not been checking her blood sugars at home recently but reports occasional tingling in her legs, which she associates with high blood sugar levels.  The patient's last A1C check in May showed a  significant improvement, down to 5.6 from 11.3. She is also on armor thyroid, managed by her primary care doctor. The patient has been maintaining her vitamin D levels, which were at 60 in the spring.  The patient is also dealing with the stress of home construction and planning for an upcoming family event. She expresses optimism about her ability to control her eating habits, especially with sweets, in the upcoming holiday season. She finds the monthly check-ins helpful in managing her conditions.          PHYSICAL EXAM:  Blood pressure 127/83, pulse 79, temperature 98 F (36.7 C), height 5\' 4"  (1.626 m), weight 227 lb (103 kg), SpO2 98%. Body mass index is 38.96 kg/m.  DIAGNOSTIC DATA REVIEWED:  BMET    Component Value Date/Time   NA 139 08/06/2022 0855   K 4.5 08/06/2022 0855   CL 100 08/06/2022 0855   CO2 22 08/06/2022 0855   GLUCOSE 220 (H) 08/06/2022 0855   GLUCOSE 176 (H) 07/20/2022 1334   BUN 10 08/06/2022 0855   CREATININE 0.57 08/06/2022 0855   CREATININE 0.60 05/26/2019 1616   CALCIUM 9.4 08/06/2022 0855   GFRNONAA >60 08/29/2019 1150   GFRNONAA 108 05/26/2019 1616   GFRAA >60 08/29/2019 1150   GFRAA 125 05/26/2019 1616   Lab Results  Component Value Date   HGBA1C 5.6 12/10/2022   HGBA1C 5.6 07/24/2015   Lab Results  Component Value Date   INSULIN 27.4 (H) 12/10/2022   INSULIN 50.6 (H) 08/06/2022   Lab Results  Component Value Date   TSH  4.83 07/20/2022   CBC    Component Value Date/Time   WBC 8.2 08/06/2022 0855   WBC 7.4 01/26/2022 0914   RBC 4.55 08/06/2022 0855   RBC 4.38 01/26/2022 0914   HGB 14.1 08/06/2022 0855   HCT 42.1 08/06/2022 0855   PLT 307 08/06/2022 0855   MCV 93 08/06/2022 0855   MCH 31.0 08/06/2022 0855   MCH 31.1 08/29/2019 1150   MCHC 33.5 08/06/2022 0855   MCHC 33.9 01/26/2022 0914   RDW 12.5 08/06/2022 0855   Iron Studies    Component Value Date/Time   FERRITIN 51.1 06/15/2016 1218   Lipid Panel     Component  Value Date/Time   CHOL 120 01/26/2022 0914   CHOL 153 02/06/2020 0947   TRIG 164.0 (H) 01/26/2022 0914   HDL 38.00 (L) 01/26/2022 0914   HDL 36 (L) 02/06/2020 0947   CHOLHDL 3 01/26/2022 0914   VLDL 32.8 01/26/2022 0914   LDLCALC 49 01/26/2022 0914   LDLCALC 88 02/06/2020 0947   LDLDIRECT 118.0 01/08/2021 1438   Hepatic Function Panel     Component Value Date/Time   PROT 7.2 08/06/2022 0855   ALBUMIN 4.4 08/06/2022 0855   AST 19 08/06/2022 0855   ALT 24 08/06/2022 0855   ALKPHOS 105 08/06/2022 0855   BILITOT 0.5 08/06/2022 0855   BILIDIR 0.10 02/06/2020 0947      Component Value Date/Time   TSH 4.83 07/20/2022 1334   Nutritional Lab Results  Component Value Date   VD25OH 60.6 12/10/2022   VD25OH 20.1 (L) 08/06/2022   VD25OH 18.31 (L) 04/24/2021     Assessment and Plan    Type 2 Diabetes Mellitus Stable on Mounjaro and Metformin. No reported GI issues with Mounjaro. Last A1C in May was 5.6, down from 11.3. -Continue Mounjaro and Metformin. -Check blood glucose, kidney function, liver function, electrolytes, and vitamin levels today. -Check TSH today. -Encouraged to check blood glucose at home, especially if experiencing tingling in legs.  Obesity Weight stable over the past month despite significant home stressors. Following category four eating plan approximately 50% of the time and walking for exercise about 20 minutes three times per week. -Continue current diet and exercise regimen. -Refill Mounjaro prescription. -Discussed strategies for maintaining diet during upcoming holiday season, including Halloween candy strategy. -Plan to make December appointment today. -Continue monthly check-ins for support and accountability.  Hypothyroidism On Armour Thyroid, managed by primary care physician. -Continue Armour Thyroid as prescribed by primary care physician.     Vit D deficiency -Check vit D level and follow  HLD -check labs and continue with diet,  exercise and weight loss     She was informed of the importance of frequent follow up visits to maximize her success with intensive lifestyle modifications for her multiple health conditions.    Quillian Quince, MD

## 2023-05-14 ENCOUNTER — Other Ambulatory Visit (HOSPITAL_COMMUNITY): Payer: Self-pay

## 2023-05-14 ENCOUNTER — Ambulatory Visit
Admission: RE | Admit: 2023-05-14 | Discharge: 2023-05-14 | Disposition: A | Discharge status: Home / Self Care | Payer: No Typology Code available for payment source | Source: Ambulatory Visit | Attending: Physical Medicine and Rehabilitation | Admitting: Physical Medicine and Rehabilitation

## 2023-05-14 DIAGNOSIS — R202 Paresthesia of skin: Secondary | ICD-10-CM

## 2023-05-14 DIAGNOSIS — G8929 Other chronic pain: Secondary | ICD-10-CM

## 2023-05-14 LAB — CBC WITH DIFFERENTIAL/PLATELET
Basophils Absolute: 0.1 10*3/uL (ref 0.0–0.2)
Basos: 1 %
EOS (ABSOLUTE): 0.1 10*3/uL (ref 0.0–0.4)
Eos: 2 %
Hematocrit: 41.5 % (ref 34.0–46.6)
Hemoglobin: 14.1 g/dL (ref 11.1–15.9)
Immature Grans (Abs): 0 10*3/uL (ref 0.0–0.1)
Immature Granulocytes: 0 %
Lymphocytes Absolute: 2.5 10*3/uL (ref 0.7–3.1)
Lymphs: 33 %
MCH: 31.9 pg (ref 26.6–33.0)
MCHC: 34 g/dL (ref 31.5–35.7)
MCV: 94 fL (ref 79–97)
Monocytes Absolute: 0.5 10*3/uL (ref 0.1–0.9)
Monocytes: 7 %
Neutrophils Absolute: 4.4 10*3/uL (ref 1.4–7.0)
Neutrophils: 57 %
Platelets: 296 10*3/uL (ref 150–450)
RBC: 4.42 x10E6/uL (ref 3.77–5.28)
RDW: 12.6 % (ref 11.7–15.4)
WBC: 7.6 10*3/uL (ref 3.4–10.8)

## 2023-05-14 LAB — INSULIN, RANDOM: INSULIN: 28.9 u[IU]/mL — ABNORMAL HIGH (ref 2.6–24.9)

## 2023-05-14 LAB — CMP14+EGFR
ALT: 31 [IU]/L (ref 0–32)
AST: 29 [IU]/L (ref 0–40)
Albumin: 4.5 g/dL (ref 3.8–4.9)
Alkaline Phosphatase: 100 [IU]/L (ref 44–121)
BUN/Creatinine Ratio: 33 — ABNORMAL HIGH (ref 9–23)
BUN: 21 mg/dL (ref 6–24)
Bilirubin Total: 0.7 mg/dL (ref 0.0–1.2)
CO2: 24 mmol/L (ref 20–29)
Calcium: 9.4 mg/dL (ref 8.7–10.2)
Chloride: 102 mmol/L (ref 96–106)
Creatinine, Ser: 0.64 mg/dL (ref 0.57–1.00)
Globulin, Total: 3.1 g/dL (ref 1.5–4.5)
Glucose: 76 mg/dL (ref 70–99)
Potassium: 4.2 mmol/L (ref 3.5–5.2)
Sodium: 139 mmol/L (ref 134–144)
Total Protein: 7.6 g/dL (ref 6.0–8.5)
eGFR: 106 mL/min/{1.73_m2} (ref >59)

## 2023-05-14 LAB — HEMOGLOBIN A1C
Est. average glucose Bld gHb Est-mCnc: 108 mg/dL
Hgb A1c MFr Bld: 5.4 % (ref 4.8–5.6)

## 2023-05-14 LAB — LIPID PANEL WITH LDL/HDL RATIO
Cholesterol, Total: 118 mg/dL (ref 100–199)
HDL: 43 mg/dL (ref >39)
LDL Chol Calc (NIH): 54 mg/dL (ref 0–99)
LDL/HDL Ratio: 1.3 {ratio} (ref 0.0–3.2)
Triglycerides: 119 mg/dL (ref 0–149)
VLDL Cholesterol Cal: 21 mg/dL (ref 5–40)

## 2023-05-14 LAB — VITAMIN D 25 HYDROXY (VIT D DEFICIENCY, FRACTURES): Vit D, 25-Hydroxy: 54.3 ng/mL (ref 30.0–100.0)

## 2023-05-14 LAB — VITAMIN B12: Vitamin B-12: 717 pg/mL (ref 232–1245)

## 2023-05-14 LAB — TSH: TSH: 2.4 u[IU]/mL (ref 0.450–4.500)

## 2023-05-14 NOTE — Telephone Encounter (Signed)
Mounjaro approved and patient has been notified via [employer].

## 2023-05-18 ENCOUNTER — Telehealth (INDEPENDENT_AMBULATORY_CARE_PROVIDER_SITE_OTHER): Payer: No Typology Code available for payment source | Admitting: Psychology

## 2023-05-18 DIAGNOSIS — F4320 Adjustment disorder, unspecified: Secondary | ICD-10-CM

## 2023-05-18 DIAGNOSIS — F5089 Other specified eating disorder: Secondary | ICD-10-CM | POA: Diagnosis not present

## 2023-05-18 NOTE — Progress Notes (Signed)
  Office: [phone]  /  Fax: [phone]    Date: May 18, 2023  Appointment Start Time: 8:01am Duration: 26 minutes Provider: Lawerance Cruel, Psy.D. Type of Session: Individual Therapy  Location of Patient: Home (private location) Location of Provider: Provider's Home (private office) Type of Contact: Telepsychological Visit via MyChart Video Visit  Session Content: [name] is a 52 y.o. female presenting for a follow-up appointment to address the previously established treatment goal of increasing coping skills.Today's appointment was a telepsychological visit. [name] provided verbal consent for today's telepsychological appointment and she is aware she is responsible for securing confidentiality on her end of the session. Prior to proceeding with today's appointment, [name]'s physical location at the time of this appointment was obtained as well a phone number she could be reached at in the event of technical difficulties. [name] and this provider participated in today's telepsychological service.   This provider conducted a brief check-in. Sui stated that while she has not lost weight, she is "doing well" and is "happy with maintaining." She noted due to construction in her home, she does not have a kitchen and laundry room. [name] also discussed attending her sister's destination wedding, noting she made better choices and engaged in portion control. Notably, she described a reduction in emotional eating behaviors and being more consistent with eating habits, medications, etc. Positive reinforcement was provided. Furthermore, [name] was engaged in problem solving to develop a plan to help cope with urges/cravings involving activities to relax, activities to distract, comforting places, people to call and connect with, and activities that help soothe senses. She was observed writing the plan. Overall, [name] was receptive to today's appointment as evidenced by openness to  sharing, responsiveness to feedback, and willingness to implement discussed strategies .  Mental Status Examination:  Appearance: neat Behavior: appropriate to circumstances Mood: neutral Affect: mood congruent Speech: WNL Eye Contact: appropriate Psychomotor Activity: WNL Gait: unable to assess Thought Process: linear, logical, and goal directed and no evidence or endorsement of suicidal, homicidal, and self-harm ideation, plan and intent  Thought Content/Perception: no hallucinations, delusions, bizarre thinking or behavior endorsed or observed Orientation: AAOx4 Memory/Concentration: intact Insight: good Judgment: good  Interventions:  Conducted a brief chart review Provided empathic reflections and validation Reviewed content from the previous session Provided positive reinforcement Employed supportive psychotherapy interventions to facilitate reduced distress and to improve coping skills with identified stressors Engaged patient in problem solving  DSM-5 Diagnosis(es):  F50.89 Other Specified Feeding or Eating Disorder, Emotional Eating Behaviors and  F43.20 Adjustment Disorder, Unspecified  Treatment Goal & Progress: During the initial appointment with this provider, the following treatment goal was established: increase coping skills. [name] has demonstrated progress in her goal as evidenced by increased awareness of hunger patterns, increased awareness of triggers for emotional eating behaviors, and reduction in emotional eating behaviors . [name] also continues to demonstrate willingness to engage in learned skill(s).  Plan: Based on progress to date per [name]'s self-report and an upcoming work trip, the next appointment is scheduled for 06/14/2023 at 8am, which will be via MyChart Video Visit. The next session will focus on working towards the established treatment goal.

## 2023-06-03 ENCOUNTER — Other Ambulatory Visit: Payer: Self-pay | Admitting: Internal Medicine

## 2023-06-03 DIAGNOSIS — E782 Mixed hyperlipidemia: Secondary | ICD-10-CM

## 2023-06-03 DIAGNOSIS — I10 Essential (primary) hypertension: Secondary | ICD-10-CM

## 2023-06-07 ENCOUNTER — Other Ambulatory Visit: Payer: Self-pay

## 2023-06-07 DIAGNOSIS — I10 Essential (primary) hypertension: Secondary | ICD-10-CM

## 2023-06-07 DIAGNOSIS — E782 Mixed hyperlipidemia: Secondary | ICD-10-CM

## 2023-06-07 MED ORDER — ROSUVASTATIN CALCIUM 20 MG PO TABS
20.0000 mg | ORAL_TABLET | Freq: Every day | ORAL | 0 refills | Status: DC
Start: 2023-06-07 — End: 2023-10-13

## 2023-06-07 MED ORDER — LOSARTAN POTASSIUM 25 MG PO TABS
25.0000 mg | ORAL_TABLET | Freq: Every day | ORAL | 0 refills | Status: DC
Start: 2023-06-07 — End: 2023-08-16

## 2023-06-09 ENCOUNTER — Other Ambulatory Visit: Payer: Self-pay | Admitting: Family Medicine

## 2023-06-09 DIAGNOSIS — E039 Hypothyroidism, unspecified: Secondary | ICD-10-CM

## 2023-06-10 ENCOUNTER — Ambulatory Visit (INDEPENDENT_AMBULATORY_CARE_PROVIDER_SITE_OTHER): Payer: BC Managed Care – PPO | Admitting: Family Medicine

## 2023-06-11 ENCOUNTER — Ambulatory Visit: Payer: BC Managed Care – PPO | Admitting: Internal Medicine

## 2023-06-14 ENCOUNTER — Telehealth (INDEPENDENT_AMBULATORY_CARE_PROVIDER_SITE_OTHER): Payer: Self-pay | Admitting: Psychology

## 2023-06-14 DIAGNOSIS — F5089 Other specified eating disorder: Secondary | ICD-10-CM

## 2023-06-14 DIAGNOSIS — F4320 Adjustment disorder, unspecified: Secondary | ICD-10-CM | POA: Diagnosis not present

## 2023-06-14 NOTE — Progress Notes (Signed)
  Office: [phone]  /  Fax: [phone]    Date: June 14, 2023  Appointment Start Time: 8:01am Duration: 29 minutes Provider: Lawerance Cruel, Psy.D. Type of Session: Individual Therapy  Location of Patient: Home (private location) Location of Provider: Provider's Home (private office) Type of Contact: Telepsychological Visit via MyChart Video Visit  Session Content: [name] is a 52 y.o. female presenting for a follow-up appointment to address the previously established treatment goal of increasing coping skills.Today's appointment was a telepsychological visit. [name] provided verbal consent for today's telepsychological appointment and she is aware she is responsible for securing confidentiality on her end of the session. Prior to proceeding with today's appointment, [name]'s physical location at the time of this appointment was obtained as well a phone number she could be reached at in the event of technical difficulties. [name] and this provider participated in today's telepsychological service.   This provider conducted a brief check-in. [name] acknowledged, "I wasn't as committed to the plan this month as previous months." Further explored and processed. Reflected on what went well during a conference trip and what she could do differently in similar situations in the future. Notably, she reflected on implementing previously discussed strategies. Positive reinforcement was provided. Psychoeducation regarding mindfulness was provided. A handout was provided to [name] with further information regarding mindfulness, including exercises. This provider also explained the benefit of mindfulness as it relates to emotional eating. [name] was encouraged to engage in the provided exercises between now and the next appointment with this provider. [name] agreed. During today's appointment, [name] was led through a mindfulness exercise involving her senses. [name] provided verbal  consent during today's appointment for this provider to send a handout about mindfulness via e-mail. Furthermore, termination planning was discussed. [name] was receptive to a follow-up appointment in 3-4 weeks and an additional follow-up/termination appointment in 4-5 weeks after that. [name] was receptive to today's appointment as evidenced by openness to sharing, responsiveness to feedback, and willingness to engage in mindfulness exercises to assist with coping.  Mental Status Examination:  Appearance: neat Behavior: appropriate to circumstances Mood: neutral Affect: mood congruent Speech: WNL Eye Contact: appropriate Psychomotor Activity: WNL Gait: unable to assess Thought Process: linear, logical, and goal directed and no evidence or endorsement of suicidal, homicidal, and self-harm ideation, plan and intent  Thought Content/Perception: no hallucinations, delusions, bizarre thinking or behavior endorsed or observed Orientation: AAOx4 Memory/Concentration: intact Insight: good Judgment: good  Interventions:  Conducted a brief chart review Provided empathic reflections and validation Reviewed content from the previous session Provided positive reinforcement Employed supportive psychotherapy interventions to facilitate reduced distress and to improve coping skills with identified stressors Psychoeducation provided regarding mindfulness Engaged patient in mindfulness exercise(s) Discussed termination planning  DSM-5 Diagnosis(es):  F50.89 Other Specified Feeding or Eating Disorder, Emotional Eating Behaviors and  F43.20 Adjustment Disorder, Unspecified  Treatment Goal & Progress: During the initial appointment with this provider, the following treatment goal was established: increase coping skills. [name] has demonstrated progress in her goal as evidenced by increased awareness of hunger patterns, increased awareness of triggers for emotional eating behaviors, and reduction in  emotional eating behaviors . [name] also continues to demonstrate willingness to engage in learned skill(s).  Plan: Based on [name]'s schedule, the next appointment is scheduled for 07/19/2023 at 8am, which will be via MyChart Video Visit. The next session will focus on working towards the established treatment goal.

## 2023-06-15 ENCOUNTER — Encounter (INDEPENDENT_AMBULATORY_CARE_PROVIDER_SITE_OTHER): Payer: Self-pay | Admitting: Family Medicine

## 2023-06-15 ENCOUNTER — Ambulatory Visit (INDEPENDENT_AMBULATORY_CARE_PROVIDER_SITE_OTHER): Payer: No Typology Code available for payment source | Admitting: Family Medicine

## 2023-06-15 ENCOUNTER — Other Ambulatory Visit (HOSPITAL_COMMUNITY): Payer: Self-pay

## 2023-06-15 VITALS — BP 140/73 | HR 70 | Temp 98.0°F | Ht 64.0 in | Wt 228.0 lb

## 2023-06-15 DIAGNOSIS — I10 Essential (primary) hypertension: Secondary | ICD-10-CM | POA: Diagnosis not present

## 2023-06-15 DIAGNOSIS — E669 Obesity, unspecified: Secondary | ICD-10-CM | POA: Diagnosis not present

## 2023-06-15 DIAGNOSIS — Z6839 Body mass index (BMI) 39.0-39.9, adult: Secondary | ICD-10-CM

## 2023-06-15 DIAGNOSIS — Z7985 Long-term (current) use of injectable non-insulin antidiabetic drugs: Secondary | ICD-10-CM

## 2023-06-15 DIAGNOSIS — E118 Type 2 diabetes mellitus with unspecified complications: Secondary | ICD-10-CM

## 2023-06-15 DIAGNOSIS — Z7984 Long term (current) use of oral hypoglycemic drugs: Secondary | ICD-10-CM

## 2023-06-15 MED ORDER — TIRZEPATIDE 12.5 MG/0.5ML ~~LOC~~ SOAJ
12.5000 mg | SUBCUTANEOUS | 0 refills | Status: DC
Start: 2023-06-15 — End: 2023-07-15
  Filled 2023-06-15: qty 2, 28d supply, fill #0

## 2023-06-15 MED ORDER — DAPAGLIFLOZIN PROPANEDIOL 10 MG PO TABS
10.0000 mg | ORAL_TABLET | Freq: Every day | ORAL | 0 refills | Status: DC
Start: 2023-06-15 — End: 2023-12-09
  Filled 2023-06-15: qty 30, 30d supply, fill #0

## 2023-06-15 NOTE — Progress Notes (Signed)
.smr  Office: [phone]  /  Fax: [phone]  WEIGHT SUMMARY AND BIOMETRICS  Anthropometric Measurements Height: 5\' 4"  (1.626 m) Weight: 228 lb (103.4 kg) BMI (Calculated): 39.12 Weight at Last Visit: 227 lb Weight Lost Since Last Visit: 0 Weight Gained Since Last Visit: 1 lb Starting Weight: 276 lb Total Weight Loss (lbs): 49 lb (22.2 kg)   Body Composition  Body Fat %: 46.9 % Fat Mass (lbs): 107 lbs Muscle Mass (lbs): 114.8 lbs Total Body Water (lbs): 87 lbs Visceral Fat Rating : 14   Other Clinical Data Fasting: No Labs: No Today's Visit #: 11 Starting Date: 07/21/22    Chief Complaint: OBESITY   History of Present Illness   The patient, diagnosed with type two diabetes, hypertension, and obesity, presents for a follow-up visit. She reports a weight gain of one pound over the past month and adherence to her category four eating plan approximately fifty percent of the time. She engages in walking for exercise twenty minutes three times per week. The patient is on Farxiga, Sullivan, and metformin for diabetes management, and losartan for hypertension. Her blood pressure reading was slightly elevated at 140/73 during the visit.  The patient acknowledges challenges with meal planning and exercise consistency, attributing these to personal prioritization issues. She expresses a desire to improve these areas. She also reports a recent trip [place], where she indulged in various cultural foods, leading to a slight weight gain. She expresses understanding of the consequences of her dietary choices and a willingness to make necessary adjustments.  The patient also discusses her protein intake, expressing concerns about the health implications of consuming processed meats. She is interested in understanding the best sources of protein for her diet and the impact of protein on muscle mass. She acknowledges a downward trend in her muscle mass  and expresses a desire to increase her protein intake and muscle-strengthening activities.  The patient is aware of the trajectory of weight loss and understands that occasional indulgences can impact her progress. She expresses a commitment to managing her health conditions through diet, exercise, and weight loss. She is scheduled for a follow-up visit in December and January to discuss holiday eating strategies and continue monitoring her health conditions.          PHYSICAL EXAM:  Blood pressure (!) 140/73, pulse 70, temperature 98 F (36.7 C), height 5\' 4"  (1.626 m), weight 228 lb (103.4 kg), SpO2 98%. Body mass index is 39.14 kg/m.  DIAGNOSTIC DATA REVIEWED:  BMET    Component Value Date/Time   NA 139 05/13/2023 1038   K 4.2 05/13/2023 1038   CL 102 05/13/2023 1038   CO2 24 05/13/2023 1038   GLUCOSE 76 05/13/2023 1038   GLUCOSE 176 (H) 07/20/2022 1334   BUN 21 05/13/2023 1038   CREATININE 0.64 05/13/2023 1038   CREATININE 0.60 05/26/2019 1616   CALCIUM 9.4 05/13/2023 1038   GFRNONAA >60 08/29/2019 1150   GFRNONAA 108 05/26/2019 1616   GFRAA >60 08/29/2019 1150   GFRAA 125 05/26/2019 1616   Lab Results  Component Value Date   HGBA1C 5.4 05/13/2023   HGBA1C 5.6 07/24/2015   Lab Results  Component Value Date   INSULIN 28.9 (H) 05/13/2023   INSULIN 50.6 (H) 08/06/2022   Lab Results  Component Value Date   TSH 2.400 05/13/2023   CBC    Component Value Date/Time   WBC 7.6 05/13/2023 1038   WBC 7.4 01/26/2022 0914  RBC 4.42 05/13/2023 1038   RBC 4.38 01/26/2022 0914   HGB 14.1 05/13/2023 1038   HCT 41.5 05/13/2023 1038   PLT 296 05/13/2023 1038   MCV 94 05/13/2023 1038   MCH 31.9 05/13/2023 1038   MCH 31.1 08/29/2019 1150   MCHC 34.0 05/13/2023 1038   MCHC 33.9 01/26/2022 0914   RDW 12.6 05/13/2023 1038   Iron Studies    Component Value Date/Time   FERRITIN 51.1 06/15/2016 1218   Lipid Panel     Component Value Date/Time   CHOL 118 05/13/2023  1038   TRIG 119 05/13/2023 1038   HDL 43 05/13/2023 1038   CHOLHDL 3 01/26/2022 0914   VLDL 32.8 01/26/2022 0914   LDLCALC 54 05/13/2023 1038   LDLDIRECT 118.0 01/08/2021 1438   Hepatic Function Panel     Component Value Date/Time   PROT 7.6 05/13/2023 1038   ALBUMIN 4.5 05/13/2023 1038   AST 29 05/13/2023 1038   ALT 31 05/13/2023 1038   ALKPHOS 100 05/13/2023 1038   BILITOT 0.7 05/13/2023 1038   BILIDIR 0.10 02/06/2020 0947      Component Value Date/Time   TSH 2.400 05/13/2023 1038   Nutritional Lab Results  Component Value Date   VD25OH 54.3 05/13/2023   VD25OH 60.6 12/10/2022   VD25OH 20.1 (L) 08/06/2022     Assessment and Plan    Type 2 Diabetes Mellitus Type 2 diabetes mellitus managed with Legrand Pitts, and metformin. Blood sugars not regularly monitored at home; no recent hypoglycemic episodes. A1c used for long-term glucose control. Discussed importance of blood glucose meter and strips for symptomatic hypoglycemia. Reported one low blood sugar episode a month ago. - Continue Legrand Pitts, and metformin - Keep blood glucose meter and strips available for symptomatic hypoglycemia  Hypertension Hypertension managed with losartan. Blood pressure today 140/73, borderline elevated. Discussed monitoring blood pressure trends and potential medication adjustment if elevation persists. - Continue losartan - Monitor blood pressure for trends  Obesity Obesity with recent weight gain of one pound. Following category four eating plan 50% of the time and walking 20 minutes three times per week. Discussed challenges with meal planning and exercise adherence. Emphasized protein intake and muscle strengthening activities. Provided Thanksgiving eating strategies to avoid overeating. - Discuss meal planning strategies - Increase protein intake - Increase strengthening activities - Implement Thanksgiving eating strategies to avoid overeating  General Health  Maintenance Discussed strategies for managing diet and exercise during holidays. Emphasized protein intake and muscle strengthening activities to prevent muscle mass loss and maintain metabolism. - Discuss Christmas holiday strategies at next visit  Follow-up - Follow-up appointment on July 15, 2023, at 8 AM - Follow-up with Dr. Dewaine Conger on August 18, 2022.       She was informed of the importance of frequent follow up visits to maximize her success with intensive lifestyle modifications for her multiple health conditions.    Quillian Quince, MD

## 2023-07-12 ENCOUNTER — Other Ambulatory Visit (HOSPITAL_COMMUNITY): Payer: Self-pay

## 2023-07-12 ENCOUNTER — Other Ambulatory Visit (INDEPENDENT_AMBULATORY_CARE_PROVIDER_SITE_OTHER): Payer: Self-pay | Admitting: Family Medicine

## 2023-07-12 DIAGNOSIS — E118 Type 2 diabetes mellitus with unspecified complications: Secondary | ICD-10-CM

## 2023-07-13 ENCOUNTER — Encounter (HOSPITAL_COMMUNITY): Payer: Self-pay

## 2023-07-13 ENCOUNTER — Other Ambulatory Visit (HOSPITAL_COMMUNITY): Payer: Self-pay

## 2023-07-15 ENCOUNTER — Other Ambulatory Visit (HOSPITAL_COMMUNITY): Payer: Self-pay

## 2023-07-15 ENCOUNTER — Encounter (INDEPENDENT_AMBULATORY_CARE_PROVIDER_SITE_OTHER): Payer: Self-pay | Admitting: Family Medicine

## 2023-07-15 ENCOUNTER — Ambulatory Visit (INDEPENDENT_AMBULATORY_CARE_PROVIDER_SITE_OTHER): Payer: No Typology Code available for payment source | Admitting: Family Medicine

## 2023-07-15 VITALS — BP 121/78 | HR 68 | Temp 98.0°F | Ht 64.0 in | Wt 223.0 lb

## 2023-07-15 DIAGNOSIS — E119 Type 2 diabetes mellitus without complications: Secondary | ICD-10-CM

## 2023-07-15 DIAGNOSIS — Z6838 Body mass index (BMI) 38.0-38.9, adult: Secondary | ICD-10-CM

## 2023-07-15 DIAGNOSIS — E669 Obesity, unspecified: Secondary | ICD-10-CM

## 2023-07-15 DIAGNOSIS — E118 Type 2 diabetes mellitus with unspecified complications: Secondary | ICD-10-CM

## 2023-07-15 DIAGNOSIS — Z7985 Long-term (current) use of injectable non-insulin antidiabetic drugs: Secondary | ICD-10-CM

## 2023-07-15 DIAGNOSIS — I10 Essential (primary) hypertension: Secondary | ICD-10-CM

## 2023-07-15 MED ORDER — TIRZEPATIDE 12.5 MG/0.5ML ~~LOC~~ SOAJ
12.5000 mg | SUBCUTANEOUS | 0 refills | Status: DC
Start: 2023-07-15 — End: 2023-08-12
  Filled 2023-07-15: qty 2, 28d supply, fill #0

## 2023-07-15 NOTE — Progress Notes (Signed)
.smr  Office: [phone]  /  Fax: [phone]  WEIGHT SUMMARY AND BIOMETRICS  Anthropometric Measurements Height: 5\' 4"  (1.626 m) Weight: 223 lb (101.2 kg) BMI (Calculated): 38.26 Weight at Last Visit: 228 lb Weight Lost Since Last Visit: 5 lb Weight Gained Since Last Visit: 0 Starting Weight: 276 lb Total Weight Loss (lbs): 54 lb (24.5 kg)   Body Composition  Body Fat %: 47 % Fat Mass (lbs): 105 lbs Muscle Mass (lbs): 112.4 lbs Total Body Water (lbs): 85.4 lbs Visceral Fat Rating : 14   Other Clinical Data Fasting: No Labs: No Today's Visit #: 12 Starting Date: 07/21/22    Chief Complaint: OBESITY   History of Present Illness   The patient, with a history of type 2 diabetes, hypertension, and obesity, presents for a routine follow-up. She reports good adherence to her prescribed medications, including Mounjaro, metformin, Farxiga, and losartan. Her most recent hemoglobin A1c was well controlled at 5.4. She has been following a category four eating plan 75% of the time and has lost five pounds in the last month.  The patient has been experiencing constipation, which she manages with a fiber supplement. She reports that bowel movements can still be painful at times. She has been making an effort to increase her water intake.  The patient experienced a malfunction with her Mounjaro injection pen, which resulted in the medication squirting out after injection. She contacted the manufacturer and received a voucher for a replacement.  The patient has been making lifestyle changes over the past year to manage her health conditions. She reports that stress and lack of meal planning can lead to unhealthy eating habits. She has found that focusing on protein intake and having healthy snack options available helps her stay on track. She has noticed improvements in her energy levels and physical comfort since making these changes.          PHYSICAL EXAM:  Blood pressure  121/78, pulse 68, temperature 98 F (36.7 C), height 5\' 4"  (1.626 m), weight 223 lb (101.2 kg), SpO2 97%. Body mass index is 38.28 kg/m.  DIAGNOSTIC DATA REVIEWED:  BMET    Component Value Date/Time   NA 139 05/13/2023 1038   K 4.2 05/13/2023 1038   CL 102 05/13/2023 1038   CO2 24 05/13/2023 1038   GLUCOSE 76 05/13/2023 1038   GLUCOSE 176 (H) 07/20/2022 1334   BUN 21 05/13/2023 1038   CREATININE 0.64 05/13/2023 1038   CREATININE 0.60 05/26/2019 1616   CALCIUM 9.4 05/13/2023 1038   GFRNONAA >60 08/29/2019 1150   GFRNONAA 108 05/26/2019 1616   GFRAA >60 08/29/2019 1150   GFRAA 125 05/26/2019 1616   Lab Results  Component Value Date   HGBA1C 5.4 05/13/2023   HGBA1C 5.6 07/24/2015   Lab Results  Component Value Date   INSULIN 28.9 (H) 05/13/2023   INSULIN 50.6 (H) 08/06/2022   Lab Results  Component Value Date   TSH 2.400 05/13/2023   CBC    Component Value Date/Time   WBC 7.6 05/13/2023 1038   WBC 7.4 01/26/2022 0914   RBC 4.42 05/13/2023 1038   RBC 4.38 01/26/2022 0914   HGB 14.1 05/13/2023 1038   HCT 41.5 05/13/2023 1038   PLT 296 05/13/2023 1038   MCV 94 05/13/2023 1038   MCH 31.9 05/13/2023 1038   MCH 31.1 08/29/2019 1150   MCHC 34.0 05/13/2023 1038   MCHC 33.9 01/26/2022 0914   RDW 12.6 05/13/2023 1038   Iron Studies  Component Value Date/Time   FERRITIN 51.1 06/15/2016 1218   Lipid Panel     Component Value Date/Time   CHOL 118 05/13/2023 1038   TRIG 119 05/13/2023 1038   HDL 43 05/13/2023 1038   CHOLHDL 3 01/26/2022 0914   VLDL 32.8 01/26/2022 0914   LDLCALC 54 05/13/2023 1038   LDLDIRECT 118.0 01/08/2021 1438   Hepatic Function Panel     Component Value Date/Time   PROT 7.6 05/13/2023 1038   ALBUMIN 4.5 05/13/2023 1038   AST 29 05/13/2023 1038   ALT 31 05/13/2023 1038   ALKPHOS 100 05/13/2023 1038   BILITOT 0.7 05/13/2023 1038   BILIDIR 0.10 02/06/2020 0947      Component Value Date/Time   TSH 2.400 05/13/2023 1038    Nutritional Lab Results  Component Value Date   VD25OH 54.3 05/13/2023   VD25OH 60.6 12/10/2022   VD25OH 20.1 (L) 08/06/2022     Assessment and Plan    Type 2 Diabetes Mellitus Well-controlled with Mounjaro, metformin, and Farxiga. Hemoglobin A1c is 5.4. Constipation managed with fiber supplements. Discussed Mounjaro pen malfunction and replacement process. - Refill Mounjaro  Obesity Recent weight loss of 5 pounds. Adhering to category four eating plan 75% of the time and walking 20 minutes, three to four times a week. Discussed strategies for maintaining weight loss and managing stress eating during holidays.  Hypertension Managed with losartan. Initial BP 144/77, repeat BP 121/78. -continue current medications and diet/exercise/weight loss to treat HTN    Follow-up - 4 weeks       She was informed of the importance of frequent follow up visits to maximize her success with intensive lifestyle modifications for her multiple health conditions.    Quillian Quince, MD

## 2023-07-19 ENCOUNTER — Telehealth (INDEPENDENT_AMBULATORY_CARE_PROVIDER_SITE_OTHER): Payer: No Typology Code available for payment source | Admitting: Psychology

## 2023-07-19 ENCOUNTER — Telehealth (INDEPENDENT_AMBULATORY_CARE_PROVIDER_SITE_OTHER): Payer: Self-pay

## 2023-07-19 DIAGNOSIS — F4320 Adjustment disorder, unspecified: Secondary | ICD-10-CM

## 2023-07-19 DIAGNOSIS — F5089 Other specified eating disorder: Secondary | ICD-10-CM | POA: Diagnosis not present

## 2023-07-19 NOTE — Progress Notes (Signed)
  Office: [phone]  /  Fax: [phone]    Date: July 19, 2023  Appointment Start Time: 8:01am Duration: 21 minutes Provider: Lawerance Cruel, Psy.D. Type of Session: Individual Therapy  Location of Patient: Home (private location) Location of Provider: Provider's Home (private office) Type of Contact: Telepsychological Visit via MyChart Video Visit  Session Content: Lanaya is a 52 y.o. female presenting for a follow-up appointment to address the previously established treatment goal of increasing coping skills.Today's appointment was a telepsychological visit. Karem provided verbal consent for today's telepsychological appointment and she is aware she is responsible for securing confidentiality on her end of the session. Prior to proceeding with today's appointment, Holly's physical location at the time of this appointment was obtained as well a phone number she could be reached at in the event of technical difficulties. Jamani and this provider participated in today's telepsychological service.   This provider conducted a brief check-in. Hikari discussed a plateau with weight loss, noting "Last month I committed to getting back on the plan." Further explored and processed. Arwilda shared that since she re-committed, she lost weight. Nevertheless, she acknowledged this month has been challenging due to the holidays. Session focused further on mindfulness to assist with coping. She discussed practicing shared exercises. Lojain was led through a mindfulness exercise (A Taste of Mindfulness) and her experience was processed. Dinna provided verbal consent during today's appointment for this provider to send the handout for today's exercise via e-mail. This provider also discussed the utilization of YouTube for mindfulness exercises (e.g., exercises by Rhae Hammock).  Overall, Myrta was receptive to today's appointment as evidenced by openness to sharing, responsiveness to  feedback, and willingness to continue engaging in mindfulness exercises.  Mental Status Examination:  Appearance: neat Behavior: appropriate to circumstances Mood: neutral Affect: mood congruent Speech: WNL Eye Contact: appropriate Psychomotor Activity: WNL Gait: unable to assess Thought Process: linear, logical, and goal directed and no evidence or endorsement of suicidal, homicidal, and self-harm ideation, plan and intent  Thought Content/Perception: no hallucinations, delusions, bizarre thinking or behavior endorsed or observed Orientation: AAOx4 Memory/Concentration: intact Insight: good Judgment: good  Interventions:  Conducted a brief chart review Provided empathic reflections and validation Reviewed content from the previous session Provided positive reinforcement Employed supportive psychotherapy interventions to facilitate reduced distress and to improve coping skills with identified stressors Engaged patient in mindfulness exercise(s)  DSM-5 Diagnosis(es):  F50.89 Other Specified Feeding or Eating Disorder, Emotional Eating Behaviors and  F43.20 Adjustment Disorder, Unspecified  Treatment Goal & Progress: During the initial appointment with this provider, the following treatment goal was established: increase coping skills. Kewanda has demonstrated progress in her goal as evidenced by increased awareness of hunger patterns, increased awareness of triggers for emotional eating behaviors, and reduction in emotional eating behaviors . Madsion also continues to demonstrate willingness to engage in learned skill(s).  Plan: The next appointment is scheduled for 08/24/2023 at 8am, which will be via MyChart Video Visit. The next session will focus on working towards the established treatment goal and termination.

## 2023-07-19 NOTE — Telephone Encounter (Signed)
Prior auth started (Key: [id number])

## 2023-07-20 ENCOUNTER — Encounter (INDEPENDENT_AMBULATORY_CARE_PROVIDER_SITE_OTHER): Payer: Self-pay

## 2023-07-20 NOTE — Telephone Encounter (Signed)
There is no PA needed at this time. Member has current approval through 05/12/2024

## 2023-08-12 ENCOUNTER — Encounter (INDEPENDENT_AMBULATORY_CARE_PROVIDER_SITE_OTHER): Payer: Self-pay | Admitting: Family Medicine

## 2023-08-12 ENCOUNTER — Ambulatory Visit (INDEPENDENT_AMBULATORY_CARE_PROVIDER_SITE_OTHER): Payer: No Typology Code available for payment source | Admitting: Family Medicine

## 2023-08-12 ENCOUNTER — Other Ambulatory Visit (HOSPITAL_COMMUNITY): Payer: Self-pay

## 2023-08-12 VITALS — BP 130/80 | HR 80 | Temp 98.3°F | Ht 64.0 in | Wt 224.0 lb

## 2023-08-12 DIAGNOSIS — E7849 Other hyperlipidemia: Secondary | ICD-10-CM

## 2023-08-12 DIAGNOSIS — Z7985 Long-term (current) use of injectable non-insulin antidiabetic drugs: Secondary | ICD-10-CM

## 2023-08-12 DIAGNOSIS — E118 Type 2 diabetes mellitus with unspecified complications: Secondary | ICD-10-CM

## 2023-08-12 DIAGNOSIS — I10 Essential (primary) hypertension: Secondary | ICD-10-CM | POA: Diagnosis not present

## 2023-08-12 DIAGNOSIS — Z7984 Long term (current) use of oral hypoglycemic drugs: Secondary | ICD-10-CM

## 2023-08-12 DIAGNOSIS — E785 Hyperlipidemia, unspecified: Secondary | ICD-10-CM | POA: Diagnosis not present

## 2023-08-12 DIAGNOSIS — K5909 Other constipation: Secondary | ICD-10-CM | POA: Diagnosis not present

## 2023-08-12 DIAGNOSIS — Z6838 Body mass index (BMI) 38.0-38.9, adult: Secondary | ICD-10-CM

## 2023-08-12 DIAGNOSIS — E669 Obesity, unspecified: Secondary | ICD-10-CM

## 2023-08-12 MED ORDER — METFORMIN HCL 500 MG PO TABS
500.0000 mg | ORAL_TABLET | Freq: Two times a day (BID) | ORAL | 0 refills | Status: DC
Start: 2023-08-12 — End: 2023-09-01
  Filled 2023-08-12: qty 60, 30d supply, fill #0

## 2023-08-12 MED ORDER — TIRZEPATIDE 12.5 MG/0.5ML ~~LOC~~ SOAJ
12.5000 mg | SUBCUTANEOUS | 0 refills | Status: DC
Start: 2023-08-12 — End: 2023-09-15
  Filled 2023-08-12: qty 2, 28d supply, fill #0

## 2023-08-12 NOTE — Progress Notes (Signed)
 .smr  Office: [phone]  /  Fax: [phone]  WEIGHT SUMMARY AND BIOMETRICS  Anthropometric Measurements Height: 5' 4 (1.626 m) Weight: 224 lb (101.6 kg) BMI (Calculated): 38.43 Weight at Last Visit: 223 lb Weight Lost Since Last Visit: 0 Weight Gained Since Last Visit: 1 lb Starting Weight: 276 lb Total Weight Loss (lbs): 53 lb (24 kg)   Body Composition  Body Fat %: 46.3 % Fat Mass (lbs): 104 lbs Muscle Mass (lbs): 114.6 lbs Total Body Water (lbs): 86 lbs Visceral Fat Rating : 13   Other Clinical Data Fasting: No Labs: No Today's Visit #: 13 Starting Date: 07/21/22    Chief Complaint: OBESITY   History of Present Illness   The patient, with a history of type 2 diabetes, hypertension, and hyperlipidemia, presents for a routine follow-up. She is currently on Farxiga , metformin , and Mounjaro  for diabetes management, Crestor  for hyperlipidemia, and losartan  for hypertension. The patient's last hemoglobin A1c was at goal, and her blood pressure was controlled at 130/80. Her last cholesterol panel was within normal limits.  Over the holiday period, the patient gained one pound but has been working on diet, exercise, and weight loss to manage her conditions. She reports following a category four eating plan about 50% of the time and exercising three to four times per week for about 20 minutes, which includes walking and stretching.  The patient has been experiencing constipation, a side effect of Mounjaro , which she manages with fiber and occasional stool softeners. She also reports having internal hemorrhoids prior to starting Mounjaro , which may contribute to her discomfort.  The patient has been contemplating joining a weightlifting program but has concerns about joint pain and a previous experience with plantar fasciitis after participating in a similar program. She is also considering tai chi classes to increase her movement.  The patient's weight loss journey has  been successful so far, with a total loss of 53 pounds since her first visit. Her BMI has decreased from the high 40s to 38, and she is aiming to continue this trend. The patient's muscle mass has been decreasing, which is a concern as she continues to lose weight. She is considering a pescetarian diet plan to add variety to her meals and help maintain her muscle mass.          PHYSICAL EXAM:  Blood pressure 130/80, pulse 80, temperature 98.3 F (36.8 C), height 5' 4 (1.626 m), weight 224 lb (101.6 kg), SpO2 98%. Body mass index is 38.45 kg/m.  DIAGNOSTIC DATA REVIEWED:  BMET    Component Value Date/Time   NA 139 05/13/2023 1038   K 4.2 05/13/2023 1038   CL 102 05/13/2023 1038   CO2 24 05/13/2023 1038   GLUCOSE 76 05/13/2023 1038   GLUCOSE 176 (H) 07/20/2022 1334   BUN 21 05/13/2023 1038   CREATININE 0.64 05/13/2023 1038   CREATININE 0.60 05/26/2019 1616   CALCIUM  9.4 05/13/2023 1038   GFRNONAA >60 08/29/2019 1150   GFRNONAA 108 05/26/2019 1616   GFRAA >60 08/29/2019 1150   GFRAA 125 05/26/2019 1616   Lab Results  Component Value Date   HGBA1C 5.4 05/13/2023   HGBA1C 5.6 07/24/2015   Lab Results  Component Value Date   INSULIN  28.9 (H) 05/13/2023   INSULIN  50.6 (H) 08/06/2022   Lab Results  Component Value Date   TSH 2.400 05/13/2023   CBC    Component Value Date/Time   WBC 7.6 05/13/2023 1038   WBC 7.4 01/26/2022 0914  RBC 4.42 05/13/2023 1038   RBC 4.38 01/26/2022 0914   HGB 14.1 05/13/2023 1038   HCT 41.5 05/13/2023 1038   PLT 296 05/13/2023 1038   MCV 94 05/13/2023 1038   MCH 31.9 05/13/2023 1038   MCH 31.1 08/29/2019 1150   MCHC 34.0 05/13/2023 1038   MCHC 33.9 01/26/2022 0914   RDW 12.6 05/13/2023 1038   Iron Studies    Component Value Date/Time   FERRITIN 51.1 06/15/2016 1218   Lipid Panel     Component Value Date/Time   CHOL 118 05/13/2023 1038   TRIG 119 05/13/2023 1038   HDL 43 05/13/2023 1038   CHOLHDL 3 01/26/2022 0914   VLDL  32.8 01/26/2022 0914   LDLCALC 54 05/13/2023 1038   LDLDIRECT 118.0 01/08/2021 1438   Hepatic Function Panel     Component Value Date/Time   PROT 7.6 05/13/2023 1038   ALBUMIN 4.5 05/13/2023 1038   AST 29 05/13/2023 1038   ALT 31 05/13/2023 1038   ALKPHOS 100 05/13/2023 1038   BILITOT 0.7 05/13/2023 1038   BILIDIR 0.10 02/06/2020 0947      Component Value Date/Time   TSH 2.400 05/13/2023 1038   Nutritional Lab Results  Component Value Date   VD25OH 54.3 05/13/2023   VD25OH 60.6 12/10/2022   VD25OH 20.1 (L) 08/06/2022     Assessment and Plan    Type 2 Diabetes Mellitus Type 2 diabetes mellitus, managed with Farxiga , metformin , and Mounjaro . Blood sugars are well-controlled; last hemoglobin A1c at goal. Reports constipation from Mounjaro , managed with fiber and stool softeners. Prefers current medications and strategies. - Refill Mounjaro  12.5 mg - Continue Farxiga  and metformin  - Advise regular use of stool softeners (docusate sodium/Colace) - Continue diet, exercise, and weight loss efforts  Hypertension Hypertension, managed with losartan . Blood pressure controlled at 130/80 mmHg. Continues diet, exercise, and weight loss efforts. - Continue losartan  - Continue diet, exercise, and weight loss efforts  Hyperlipidemia Hyperlipidemia, managed with Crestor . Last cholesterol panel within normal limits. Continues diet, exercise, and weight loss efforts. - Continue Crestor  - Continue diet, exercise, and weight loss efforts  Obesity Actively working on weight loss and exercise. Minimized holiday weight gain, following structured eating plan 50% of the time. Prefers pescetarian diet for ease and preference. Discussed monitoring muscle mass and fat percentage for healthy weight loss. Considering increasing exercise frequency and adding tai chi classes. - Switch to pescetarian diet - Increase exercise frequency, consider tai chi classes - Monitor muscle mass and fat  percentage - Schedule follow-up on September 15, 2023, and another in March.          She was informed of the importance of frequent follow up visits to maximize her success with intensive lifestyle modifications for her multiple health conditions.    Louann Penton, MD

## 2023-08-16 ENCOUNTER — Ambulatory Visit: Payer: No Typology Code available for payment source | Attending: Internal Medicine | Admitting: Internal Medicine

## 2023-08-16 ENCOUNTER — Encounter: Payer: Self-pay | Admitting: Internal Medicine

## 2023-08-16 ENCOUNTER — Other Ambulatory Visit (HOSPITAL_COMMUNITY): Payer: Self-pay

## 2023-08-16 VITALS — BP 110/78 | HR 77 | Ht 64.0 in | Wt 228.0 lb

## 2023-08-16 DIAGNOSIS — I10 Essential (primary) hypertension: Secondary | ICD-10-CM | POA: Diagnosis not present

## 2023-08-16 DIAGNOSIS — R0789 Other chest pain: Secondary | ICD-10-CM | POA: Diagnosis not present

## 2023-08-16 DIAGNOSIS — I2510 Atherosclerotic heart disease of native coronary artery without angina pectoris: Secondary | ICD-10-CM

## 2023-08-16 DIAGNOSIS — E782 Mixed hyperlipidemia: Secondary | ICD-10-CM

## 2023-08-16 MED ORDER — LOSARTAN POTASSIUM 25 MG PO TABS
12.5000 mg | ORAL_TABLET | Freq: Every day | ORAL | 1 refills | Status: DC
  Filled 2023-08-16 – 2024-04-07 (×2): qty 45, 90d supply, fill #0

## 2023-08-16 NOTE — Patient Instructions (Signed)
 Medication Instructions:  Your physician recommends that you continue on your current medications as directed. Please refer to the Current Medication list given to you today.  *If you need a refill on your cardiac medications before your next appointment, please call your pharmacy*  Lab Work: None  Testing/Procedures: Your physician has requested that you have an echocardiogram. Echocardiography is a painless test that uses sound waves to create images of your heart. It provides your doctor with information about the size and shape of your heart and how well your heart's chambers and valves are working. This procedure takes approximately one hour. There are no restrictions for this procedure. Please do NOT wear cologne, perfume, aftershave, or lotions (deodorant is allowed). Please arrive 15 minutes prior to your appointment time. This will take place at [address] [place]    Follow-Up: At [facility], you and your health needs are our priority.  As part of our continuing mission to provide you with exceptional heart care, we have created designated Provider Care Teams.  These Care Teams include your primary Cardiologist (physician) and Advanced Practice Providers (APPs -  Physician Assistants and Nurse Practitioners) who all work together to provide you with the care you need, when you need it.   Your next appointment:   12 month(s)  Provider:   Parke Poisson, MD

## 2023-08-16 NOTE — Progress Notes (Signed)
 Cardiology Office Note:  .   Date:  08/16/2023  ID:  [name], DOB 1971-05-27, MRN [id number] PCP: Watt Harlene BROCKS, MD  [facility] Providers Cardiologist:  Soyla DELENA Merck, MD    History of Present Illness: [name] is a 53 y.o. female.  Discussed the use of AI scribe software for clinical note transcription with the patient, who gave verbal consent to proceed.  History of Present Illness   The patient, with a history of heart disease, diabetes, and multiple abd surgeries, presents with a tingling sensation in both legs. The sensation, described as a 'tingly feeling', extends from the hips down to the feet on both sides. The patient reports no discomfort associated with this sensation, but notes it as a change. No red flag sx.  Previously, the patient had been experiencing chest discomfort, which has since resolved. The patient attributes this improvement to participation in a weight management program and the use of the medication Mounjaro . Since starting the program and medication in January, the patient has lost significant weight and reports increased mobility.  The patient also reports occasional abdominal pain, which has been attributed to scar tissue from previous surgeries. The patient has had a hysterectomy, bilateral ovaries removal, and colon polyp excision. The patient notes that the abdominal pain is not constant and was recently experienced on the right side. She overall feels great.        ROS: negative except per HPI above.  Studies Reviewed: .        Results   LABS A1c: 5.4  RADIOLOGY Calcium  score: 10  DIAGNOSTIC EKG: Normal (08/16/2023) Echocardiogram: moderate left ventricular hypertrophy     Risk Assessment/Calculations:             Physical Exam:   VS:  BP 110/78   Pulse 77   Ht 5' 4 (1.626 m)   Wt 228 lb (103.4 kg)   SpO2 96%   BMI 39.14 kg/m    Wt Readings from Last 3 Encounters:  08/16/23 228 lb (103.4 kg)   08/12/23 224 lb (101.6 kg)  07/15/23 223 lb (101.2 kg)     Physical Exam   VITALS: BP- 110/78 MEASUREMENTS: WT- 228 CHEST: Lungs clear to auscultation     GEN: Well nourished, well developed in no acute distress NECK: No JVD; No carotid bruits CARDIAC: RRR, no murmurs, rubs, gallops RESPIRATORY:  Clear to auscultation without rales, wheezing or rhonchi  ABDOMEN: Soft, non-tender, non-distended EXTREMITIES:  No edema; No deformity   ASSESSMENT AND PLAN: .    Assessment & Plan Essential hypertension  Coronary artery calcification  Mixed hyperlipidemia  Atypical chest pain   Assessment and Plan    Weight Management Significant weight loss (from 283 to 228 last year) with the help of [facility] weight management program and Mounjaro  medication. No current issues reported. -Continue current weight management program and Mounjaro  medication.  Chest Discomfort No recent episodes reported. EKG normal. -No changes to current management plan.  Paresthesias in Legs Bilateral paresthesias described from hips down. No discomfort or functional impairment reported. Possible familial history of neuropathy? -Monitor symptoms and report any changes or worsening. -Consider checking vitamin levels with dietary changes if indicated with med weight management team.  Hypertension Blood pressure well controlled at 110/78. Dose of antihypertensive medication halved due to episodes of lightheadedness. -Continue current dose of antihypertensive medication. losartan  12.5 mg daily.  Hyperlipidemia Cholesterol levels well managed with Crestor  20mg  daily. -Continue Crestor  20mg  daily.  Diabetes Significant improvement in A1c from 10.1 to 5.4, likely due to weight loss and Mounjaro  medication. -Continue current diabetes management plan.  Left Ventricular Hypertrophy Moderate thickening of heart muscles noted on previous echocardiogram. likely 2/2 to HTN.  -Order repeat echocardiogram to monitor  status and rule out cardiac amyloidosis.  Follow-up in 1 year or sooner if any changes or concerns arise.

## 2023-08-24 ENCOUNTER — Telehealth (INDEPENDENT_AMBULATORY_CARE_PROVIDER_SITE_OTHER): Payer: No Typology Code available for payment source | Admitting: Psychology

## 2023-08-24 DIAGNOSIS — F4320 Adjustment disorder, unspecified: Secondary | ICD-10-CM | POA: Diagnosis not present

## 2023-08-24 DIAGNOSIS — F5089 Other specified eating disorder: Secondary | ICD-10-CM

## 2023-08-24 NOTE — Progress Notes (Signed)
  Office: [phone]  /  Fax: [phone]    Date: August 24, 2023  Appointment Start Time: 8:00am Duration: 28 minutes Provider: Lawerance Cruel, Psy.D. Type of Session: Individual Therapy  Location of Patient: Home (private location) Location of Provider: Provider's Home (private office) Type of Contact: Telepsychological Visit via MyChart Video Visit  Session Content: Catonya is a 53 y.o. female presenting for a follow-up appointment to address the previously established treatment goal of increasing coping skills.Today's appointment was a telepsychological visit. Norinne provided verbal consent for today's telepsychological appointment and she is aware she is responsible for securing confidentiality on her end of the session. Prior to proceeding with today's appointment, Samira's physical location at the time of this appointment was obtained as well a phone number she could be reached at in the event of technical difficulties. Jenise and this provider participated in today's telepsychological service.   This provider conducted a brief check-in. Alyvia shared about recent events, including deviations from her prescribed structured meal plan. Since the holidays, she reported she is working on getting back on track, adding engaging in mindfulness exercises have been helpful in reducing emotional eating behaviors and coping with stressors. She reflected on progress to date, including changes in unhelpful thinking patterns surrounding weight loss/eating habits and non-scale victories. She was encouraged to write the aforementioned down for future reference. Overall, Kerlyn was receptive to today's appointment as evidenced by openness to sharing, responsiveness to feedback, and willingness to continue engaging in learned skills.  Mental Status Examination:  Appearance: neat Behavior: appropriate to circumstances Mood: neutral Affect: mood congruent Speech: WNL Eye Contact:  appropriate Psychomotor Activity: WNL Gait: unable to assess Thought Process: linear, logical, and goal directed and no evidence or endorsement of suicidal, homicidal, and self-harm ideation, plan and intent  Thought Content/Perception: no hallucinations, delusions, bizarre thinking or behavior endorsed or observed Orientation: AAOx4 Memory/Concentration: intact Insight: good Judgment: good  Interventions:  Conducted a brief chart review Provided empathic reflections and validation Provided positive reinforcement Employed supportive psychotherapy interventions to facilitate reduced distress and to improve coping skills with identified stressors Reviewed learned skills  DSM-5 Diagnosis(es):  F50.89 Other Specified Feeding or Eating Disorder, Emotional Eating Behaviors and  F43.20 Adjustment Disorder, Unspecified  Treatment Goal & Progress: During the initial appointment with this provider, the following treatment goal was established: increase coping skills. Keaton demonstrated progress in her goal as evidenced by increased awareness of hunger patterns, increased awareness of triggers for emotional eating behaviors, and reduction in emotional eating behaviors . Ajooni also continues to demonstrate willingness to engage in learned skill(s).  Plan: As previously planned, today was Donyell's last appointment with this provider. She acknowledged understanding that she may request a follow-up appointment with this provider in the future as long as she is still established with the clinic. No further follow-up planned by this provider.    Lawerance Cruel, PsyD

## 2023-08-26 ENCOUNTER — Other Ambulatory Visit (HOSPITAL_COMMUNITY): Payer: Self-pay

## 2023-09-01 ENCOUNTER — Other Ambulatory Visit: Payer: Self-pay | Admitting: Internal Medicine

## 2023-09-01 ENCOUNTER — Other Ambulatory Visit: Payer: Self-pay | Admitting: Family Medicine

## 2023-09-01 DIAGNOSIS — E118 Type 2 diabetes mellitus with unspecified complications: Secondary | ICD-10-CM

## 2023-09-01 DIAGNOSIS — I10 Essential (primary) hypertension: Secondary | ICD-10-CM

## 2023-09-01 DIAGNOSIS — E782 Mixed hyperlipidemia: Secondary | ICD-10-CM

## 2023-09-09 ENCOUNTER — Other Ambulatory Visit: Payer: Self-pay | Admitting: Family Medicine

## 2023-09-09 DIAGNOSIS — E039 Hypothyroidism, unspecified: Secondary | ICD-10-CM

## 2023-09-15 ENCOUNTER — Other Ambulatory Visit (HOSPITAL_COMMUNITY): Payer: Self-pay

## 2023-09-15 ENCOUNTER — Encounter (INDEPENDENT_AMBULATORY_CARE_PROVIDER_SITE_OTHER): Payer: Self-pay | Admitting: Family Medicine

## 2023-09-15 ENCOUNTER — Ambulatory Visit (INDEPENDENT_AMBULATORY_CARE_PROVIDER_SITE_OTHER): Payer: No Typology Code available for payment source | Admitting: Family Medicine

## 2023-09-15 VITALS — BP 117/75 | HR 76 | Temp 98.3°F | Ht 64.0 in | Wt 225.0 lb

## 2023-09-15 DIAGNOSIS — E669 Obesity, unspecified: Secondary | ICD-10-CM

## 2023-09-15 DIAGNOSIS — Z6838 Body mass index (BMI) 38.0-38.9, adult: Secondary | ICD-10-CM

## 2023-09-15 DIAGNOSIS — E785 Hyperlipidemia, unspecified: Secondary | ICD-10-CM

## 2023-09-15 DIAGNOSIS — Z7985 Long-term (current) use of injectable non-insulin antidiabetic drugs: Secondary | ICD-10-CM

## 2023-09-15 DIAGNOSIS — E118 Type 2 diabetes mellitus with unspecified complications: Secondary | ICD-10-CM

## 2023-09-15 DIAGNOSIS — I517 Cardiomegaly: Secondary | ICD-10-CM

## 2023-09-15 DIAGNOSIS — I10 Essential (primary) hypertension: Secondary | ICD-10-CM | POA: Diagnosis not present

## 2023-09-15 DIAGNOSIS — E7849 Other hyperlipidemia: Secondary | ICD-10-CM

## 2023-09-15 MED ORDER — TIRZEPATIDE 12.5 MG/0.5ML ~~LOC~~ SOAJ
12.5000 mg | SUBCUTANEOUS | 0 refills | Status: DC
  Filled 2023-09-15: qty 2, 28d supply, fill #0

## 2023-09-15 NOTE — Progress Notes (Signed)
.smr  Office: [phone]  /  Fax: [phone]  WEIGHT SUMMARY AND BIOMETRICS  Anthropometric Measurements Height: 5\' 4"  (1.626 m) Weight: 225 lb (102.1 kg) BMI (Calculated): 38.6 Weight at Last Visit: 224 lb Weight Lost Since Last Visit: 0 Weight Gained Since Last Visit: 1 lb Starting Weight: 276 lb Total Weight Loss (lbs): 52 lb (23.6 kg)   Body Composition  Body Fat %: 46.2 % Fat Mass (lbs): 104 lbs Muscle Mass (lbs): 114.8 lbs Total Body Water (lbs): 85.4 lbs Visceral Fat Rating : 13   Other Clinical Data Today's Visit #: 14 Starting Date: 07/21/22    Chief Complaint: OBESITY    History of Present Illness   [name] is a 53 year old female with obesity, type 2 diabetes, hypertension, left ventricular hypertrophy, and hyperlipidemia who presents for management of these conditions.  She experiences episodes of lightheadedness, which led to a reduction in her blood pressure medication. She is currently taking losartan 12.5 mg daily, and her blood pressure is 117/75 mmHg.  She has a history of left ventricular hypertrophy and is scheduled for a repeat echocardiogram. She occasionally experiences 'prickliness' in her legs. There is a family history of heart disease on her father's side, with her father and uncles having undergone open heart surgery and experienced heart attacks. Both her parents had neuropathy later in life.  She is on Crestor for hyperlipidemia, and recent labs indicate that her lipid levels are at goal.  She has noticed a weight gain of one pound in the last month despite following her eating plan 75% of the time and exercising 45 minutes four times per week. She has been increasing her gym visits and lifting weights, which may have contributed to a half-pound increase in muscle mass. She monitors her weight daily and noticed a three to four-pound increase over the past week, which she attributes to not having Mounjaro this week.  She has  developed a habit of eating popcorn after dinner, consuming about two cups with a small amount of oil. She is trying to incorporate a pescetarian meal plan but finds it challenging due to a busy schedule and bulk shopping habits.          PHYSICAL EXAM:  Blood pressure 117/75, pulse 76, temperature 98.3 F (36.8 C), height 5\' 4"  (1.626 m), weight 225 lb (102.1 kg), SpO2 96%. Body mass index is 38.62 kg/m.  DIAGNOSTIC DATA REVIEWED:  BMET    Component Value Date/Time   NA 139 05/13/2023 1038   K 4.2 05/13/2023 1038   CL 102 05/13/2023 1038   CO2 24 05/13/2023 1038   GLUCOSE 76 05/13/2023 1038   GLUCOSE 176 (H) 07/20/2022 1334   BUN 21 05/13/2023 1038   CREATININE 0.64 05/13/2023 1038   CREATININE 0.60 05/26/2019 1616   CALCIUM 9.4 05/13/2023 1038   GFRNONAA >60 08/29/2019 1150   GFRNONAA 108 05/26/2019 1616   GFRAA >60 08/29/2019 1150   GFRAA 125 05/26/2019 1616   Lab Results  Component Value Date   HGBA1C 5.4 05/13/2023   HGBA1C 5.6 07/24/2015   Lab Results  Component Value Date   INSULIN 28.9 (H) 05/13/2023   INSULIN 50.6 (H) 08/06/2022   Lab Results  Component Value Date   TSH 2.400 05/13/2023   CBC    Component Value Date/Time   WBC 7.6 05/13/2023 1038   WBC 7.4 01/26/2022 0914   RBC 4.42 05/13/2023 1038   RBC 4.38 01/26/2022 0914   HGB 14.1 05/13/2023 1038  HCT 41.5 05/13/2023 1038   PLT 296 05/13/2023 1038   MCV 94 05/13/2023 1038   MCH 31.9 05/13/2023 1038   MCH 31.1 08/29/2019 1150   MCHC 34.0 05/13/2023 1038   MCHC 33.9 01/26/2022 0914   RDW 12.6 05/13/2023 1038   Iron Studies    Component Value Date/Time   FERRITIN 51.1 06/15/2016 1218   Lipid Panel     Component Value Date/Time   CHOL 118 05/13/2023 1038   TRIG 119 05/13/2023 1038   HDL 43 05/13/2023 1038   CHOLHDL 3 01/26/2022 0914   VLDL 32.8 01/26/2022 0914   LDLCALC 54 05/13/2023 1038   LDLDIRECT 118.0 01/08/2021 1438   Hepatic Function Panel     Component Value Date/Time    PROT 7.6 05/13/2023 1038   ALBUMIN 4.5 05/13/2023 1038   AST 29 05/13/2023 1038   ALT 31 05/13/2023 1038   ALKPHOS 100 05/13/2023 1038   BILITOT 0.7 05/13/2023 1038   BILIDIR 0.10 02/06/2020 0947      Component Value Date/Time   TSH 2.400 05/13/2023 1038   Nutritional Lab Results  Component Value Date   VD25OH 54.3 05/13/2023   VD25OH 60.6 12/10/2022   VD25OH 20.1 (L) 08/06/2022     Assessment and Plan    Type 2 Diabetes Mellitus with likely neuropathy Type 2 diabetes is well controlled on Mounjaro. Hemoglobin A1c is at goal. Noted a weight increase of 3-4 pounds over the past week, likely due to fluid fluctuations. Discussed that each dose of Mounjaro lasts in the system for four weeks, with the strongest weight loss and blood sugar benefit in the first week.  Reports occasional prickliness in legs, possibly related to neuropathy. Discussed genetic predisposition towards neuropathy and potential need for further evaluation if symptoms persist. - Send Mounjaro prescription to [facility] pharmacy - Continue current diet and exercise regimen  Hypertension Hypertension is well controlled on losartan 12.5 mg daily. Blood pressure today is 117/75 mmHg. Medication was decreased due to episodes of lightheadedness. - Continue losartan 12.5 mg daily - Monitor blood pressure regularly - Continue current diet and exercise regimen  Left Ventricular Hypertrophy Left ventricular hypertrophy. Cardiology has ordered a repeat echocardiogram.- Review echocardiogram results once available - Consider further evaluation for neuropathy if symptoms persist - Continue current diet and exercise regimen  Hyperlipidemia Hyperlipidemia is well controlled on Crestor. Recent labs are at goal. - Continue Crestor as prescribed - Monitor lipid levels regularly - Continue current diet and exercise regimen  Obesity Obesity management includes diet, exercise, and weight loss. Gained one pound in the  last month despite following her eating plan 75% of the time and exercising 45 minutes four times per week. Discussed the importance of continuing her current regimen and the potential impact of muscle gain on weight. She is proactive about her health, particularly due to a family history of heart disease. Following a pescetarian and category four eating plan and aware of the importance of rest between strengthening exercises. - Continue current diet and exercise regimen combination of Cat 4 and pescatarian - Monitor weight and adjust plan as needed - Ensure at least one full day of rest between strengthening exercises  Follow-up - Follow up on October 13, 2023 - Set up April appointments.        She was informed of the importance of frequent follow up visits to maximize her success with intensive lifestyle modifications for her multiple health conditions.    Quillian Quince, MD

## 2023-09-16 ENCOUNTER — Ambulatory Visit (HOSPITAL_COMMUNITY)
Admission: RE | Admit: 2023-09-16 | Discharge: 2023-09-16 | Disposition: A | Discharge status: Home / Self Care | Payer: No Typology Code available for payment source | Source: Ambulatory Visit | Attending: Cardiovascular Disease | Admitting: Cardiovascular Disease

## 2023-09-16 DIAGNOSIS — I2510 Atherosclerotic heart disease of native coronary artery without angina pectoris: Secondary | ICD-10-CM | POA: Diagnosis present

## 2023-09-16 DIAGNOSIS — I10 Essential (primary) hypertension: Secondary | ICD-10-CM | POA: Diagnosis present

## 2023-09-16 LAB — ECHOCARDIOGRAM COMPLETE
AR max vel: 2.53 cm2
AV Area VTI: 2.59 cm2
AV Area mean vel: 2.52 cm2
AV Mean grad: 5 mm[Hg]
AV Peak grad: 8.3 mm[Hg]
Ao pk vel: 1.44 m/s
Area-P 1/2: 3.23 cm2
MV M vel: 1.24 m/s
MV Peak grad: 6.2 mm[Hg]
S' Lateral: 3.03 cm

## 2023-09-24 ENCOUNTER — Other Ambulatory Visit (INDEPENDENT_AMBULATORY_CARE_PROVIDER_SITE_OTHER): Payer: Self-pay | Admitting: Family Medicine

## 2023-09-24 ENCOUNTER — Other Ambulatory Visit (HOSPITAL_COMMUNITY): Payer: Self-pay

## 2023-09-24 DIAGNOSIS — E118 Type 2 diabetes mellitus with unspecified complications: Secondary | ICD-10-CM

## 2023-09-27 ENCOUNTER — Other Ambulatory Visit (HOSPITAL_COMMUNITY): Payer: Self-pay

## 2023-09-27 ENCOUNTER — Encounter (HOSPITAL_COMMUNITY): Payer: Self-pay

## 2023-10-13 ENCOUNTER — Ambulatory Visit (INDEPENDENT_AMBULATORY_CARE_PROVIDER_SITE_OTHER): Payer: No Typology Code available for payment source | Admitting: Family Medicine

## 2023-10-13 ENCOUNTER — Other Ambulatory Visit (HOSPITAL_COMMUNITY): Payer: Self-pay

## 2023-10-13 ENCOUNTER — Encounter (INDEPENDENT_AMBULATORY_CARE_PROVIDER_SITE_OTHER): Payer: Self-pay | Admitting: Family Medicine

## 2023-10-13 VITALS — BP 122/79 | HR 77 | Temp 98.0°F | Ht 64.0 in | Wt 224.0 lb

## 2023-10-13 DIAGNOSIS — Z6838 Body mass index (BMI) 38.0-38.9, adult: Secondary | ICD-10-CM

## 2023-10-13 DIAGNOSIS — E782 Mixed hyperlipidemia: Secondary | ICD-10-CM

## 2023-10-13 DIAGNOSIS — E669 Obesity, unspecified: Secondary | ICD-10-CM

## 2023-10-13 DIAGNOSIS — Z7984 Long term (current) use of oral hypoglycemic drugs: Secondary | ICD-10-CM

## 2023-10-13 DIAGNOSIS — Z7985 Long-term (current) use of injectable non-insulin antidiabetic drugs: Secondary | ICD-10-CM

## 2023-10-13 DIAGNOSIS — I10 Essential (primary) hypertension: Secondary | ICD-10-CM | POA: Diagnosis not present

## 2023-10-13 DIAGNOSIS — E119 Type 2 diabetes mellitus without complications: Secondary | ICD-10-CM

## 2023-10-13 DIAGNOSIS — E118 Type 2 diabetes mellitus with unspecified complications: Secondary | ICD-10-CM

## 2023-10-13 DIAGNOSIS — E785 Hyperlipidemia, unspecified: Secondary | ICD-10-CM

## 2023-10-13 MED ORDER — TIRZEPATIDE 12.5 MG/0.5ML ~~LOC~~ SOAJ
12.5000 mg | SUBCUTANEOUS | 0 refills | Status: DC
  Filled 2023-10-13: qty 2, 28d supply, fill #0

## 2023-10-13 MED ORDER — METFORMIN HCL 500 MG PO TABS
500.0000 mg | ORAL_TABLET | Freq: Two times a day (BID) | ORAL | 0 refills | Status: DC
  Filled 2023-10-13: qty 60, 30d supply, fill #0

## 2023-10-13 MED ORDER — ROSUVASTATIN CALCIUM 20 MG PO TABS
20.0000 mg | ORAL_TABLET | Freq: Every day | ORAL | 0 refills | Status: DC
  Filled 2023-10-13: qty 30, 30d supply, fill #0

## 2023-10-13 NOTE — Progress Notes (Signed)
 Office: [phone]  /  Fax: [phone]  WEIGHT SUMMARY AND BIOMETRICS  Anthropometric Measurements Height: 5\' 4"  (1.626 m) Weight: 224 lb (101.6 kg) BMI (Calculated): 38.43 Weight at Last Visit: 225 lb Weight Lost Since Last Visit: 1 lb Weight Gained Since Last Visit: 0 Starting Weight: 276 lb Total Weight Loss (lbs): 53 lb (24 kg)   Body Composition  Body Fat %: 46.3 % Fat Mass (lbs): 103.8 lbs Muscle Mass (lbs): 114.4 lbs Total Body Water (lbs): 85.4 lbs Visceral Fat Rating : 13   Other Clinical Data Fasting: Yes Labs: No Today's Visit #: 15 Starting Date: 07/21/22    Chief Complaint: OBESITY   History of Present Illness   The patient presents for obesity treatment and progress monitoring.  She is adhering to her category four eating plan approximately 75% of the time and engages in a combination of cardio and strengthening exercises for about 30 minutes, four times per week. She has lost one pound in the last month and a total of 53 pounds since her initial visit. She reports feeling 'incredibly different' with increased energy and engagement in life but expresses concern about being 'stuck' in her weight loss journey and seeks ways to regain momentum.  She has a history of type 2 diabetes and is currently taking metformin, Mounjaro, and [place]. Her last hemoglobin A1c was 5.4, a significant improvement from 10.5 two years ago. She is actively working on diet and exercise to enhance her diabetes management.  She also has hyperlipidemia associated with type 2 diabetes and is on Crestor 20 mg. Her last LDL was 54. She requests a refill of her Crestor.  Her hypertension is managed with losartan 12.5 mg daily, and her blood pressure is 122/79. She is focusing on diet and exercise to further improve her hypertension.  She works from home, which poses challenges for her health management due to the high demand and stress of her job, involving long hours and numerous  meetings. 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          PHYSICAL EXAM:  Blood pressure 122/79, pulse 77, temperature 98 F (36.7 C), height 5\' 4"  (1.626 m), weight 224 lb (101.6 kg), SpO2 97%. Body mass index is 38.45 kg/m.  DIAGNOSTIC DATA REVIEWED:  BMET    Component Value Date/Time   NA 139 05/13/2023 1038   K 4.2 05/13/2023 1038   CL 102 05/13/2023 1038   CO2 24 05/13/2023 1038   GLUCOSE 76 05/13/2023 1038   GLUCOSE 176 (H) 07/20/2022 1334   BUN 21 05/13/2023 1038   CREATININE 0.64 05/13/2023 1038   CREATININE 0.60 05/26/2019 1616   CALCIUM 9.4 05/13/2023 1038   GFRNONAA >60 08/29/2019 1150   GFRNONAA 108 05/26/2019 1616   GFRAA >60 08/29/2019 1150   GFRAA 125 05/26/2019 1616   Lab Results  Component Value Date   HGBA1C 5.4 05/13/2023   HGBA1C 5.6 07/24/2015   Lab Results  Component Value Date   INSULIN 28.9 (H) 05/13/2023   INSULIN 50.6 (H) 08/06/2022   Lab Results  Component Value Date   TSH 2.400 05/13/2023   CBC    Component Value Date/Time   WBC 7.6 05/13/2023 1038   WBC 7.4 01/26/2022 0914   RBC 4.42 05/13/2023 1038   RBC 4.38 01/26/2022 0914   HGB 14.1 05/13/2023 1038   HCT 41.5 05/13/2023 1038   PLT 296 05/13/2023 1038  MCV 94 05/13/2023 1038   MCH 31.9 05/13/2023 1038   MCH 31.1 08/29/2019 1150   MCHC 34.0 05/13/2023 1038   MCHC 33.9 01/26/2022 0914   RDW 12.6 05/13/2023 1038   Iron Studies    Component Value Date/Time   FERRITIN 51.1 06/15/2016 1218   Lipid Panel     Component Value Date/Time   CHOL 118 05/13/2023 1038   TRIG 119 05/13/2023 1038   HDL 43 05/13/2023 1038   CHOLHDL 3 01/26/2022 0914   VLDL 32.8 01/26/2022 0914   LDLCALC 54 05/13/2023 1038   LDLDIRECT 118.0 01/08/2021 1438   Hepatic Function Panel     Component Value Date/Time   PROT 7.6 05/13/2023 1038   ALBUMIN 4.5 05/13/2023 1038   AST 29 05/13/2023 1038   ALT 31  05/13/2023 1038   ALKPHOS 100 05/13/2023 1038   BILITOT 0.7 05/13/2023 1038   BILIDIR 0.10 02/06/2020 0947      Component Value Date/Time   TSH 2.400 05/13/2023 1038   Nutritional Lab Results  Component Value Date   VD25OH 54.3 05/13/2023   VD25OH 60.6 12/10/2022   VD25OH 20.1 (L) 08/06/2022     Assessment and Plan    Obesity She has been adhering to her category four eating plan approximately 75% of the time and engaging in cardio and strengthening exercises for 30 minutes, four times per week. She has lost one pound in the last month and a total of 53 pounds since her initial visit. She reports increased energy and engagement with life but is concerned about her recent weight loss plateau. She remains motivated to continue her weight loss journey to reduce her risk for cardiovascular disease. Encouragement was provided, reframing her perceived excuses as reasons for her current plateau, and emphasizing the importance of recognizing these factors to implement changes. Strategies for meal preparation and time management were suggested to help her stay on track with her plan. The importance of prioritizing health and longevity was discussed, comparing it to setting aside time for essential medical treatments like chemotherapy. - Provide recipe ideas to assist with meal preparation. - Encourage prioritizing meal prep and exercise for health and longevity. - Reassess progress in four weeks. -Continue category 4 plan but pt may adjust dinner to keep her calories between 400-650 and her protein intake for that meal at 40+ gm.  Type 2 Diabetes Mellitus Her type 2 diabetes is well controlled with a current hemoglobin A1c of 5.4, reduced from 10.5 two years ago. She is taking metformin, Mounjaro, and Farxiga for management and is actively working on diet and exercise to further improve her diabetes control. - Continue metformin, Mounjaro, and Farxiga as prescribed. - Encourage continued  adherence to diet and exercise regimen.  Hyperlipidemia Her hyperlipidemia, associated with type 2 diabetes, is well controlled with an LDL of 54. She is currently taking Crestor 20 mg and requests a refill. - Refill Crestor 20 mg prescription. - Encourage continued adherence to diet and exercise regimen.  Hypertension Her hypertension is well controlled with a current blood pressure of 122/79 mmHg. She is taking losartan 12.5 mg daily. - Continue losartan 12.5 mg daily.  - Encourage continued adherence to diet and exercise regimen.         I have personally spent 40 minutes total time today in preparation, patient care, and documentation for this visit, including the following: review of clinical lab tests; review of medical tests/procedures/services.    She was informed of the importance of frequent follow  up visits to maximize her success with intensive lifestyle modifications for her multiple health conditions.    Quillian Quince, MD

## 2023-10-14 ENCOUNTER — Other Ambulatory Visit (HOSPITAL_COMMUNITY): Payer: Self-pay

## 2023-11-01 ENCOUNTER — Other Ambulatory Visit (HOSPITAL_COMMUNITY): Payer: Self-pay

## 2023-11-01 MED ORDER — CYCLOBENZAPRINE HCL 10 MG PO TABS
10.0000 mg | ORAL_TABLET | Freq: Three times a day (TID) | ORAL | 0 refills | Status: DC | PRN
  Filled 2023-11-01: qty 15, 5d supply, fill #0

## 2023-11-10 ENCOUNTER — Encounter (INDEPENDENT_AMBULATORY_CARE_PROVIDER_SITE_OTHER): Payer: Self-pay | Admitting: Family Medicine

## 2023-11-10 ENCOUNTER — Ambulatory Visit (INDEPENDENT_AMBULATORY_CARE_PROVIDER_SITE_OTHER): Payer: No Typology Code available for payment source | Admitting: Family Medicine

## 2023-11-10 VITALS — BP 118/76 | HR 80 | Temp 98.5°F | Ht 64.0 in | Wt 223.0 lb

## 2023-11-10 DIAGNOSIS — Z7985 Long-term (current) use of injectable non-insulin antidiabetic drugs: Secondary | ICD-10-CM

## 2023-11-10 DIAGNOSIS — E785 Hyperlipidemia, unspecified: Secondary | ICD-10-CM

## 2023-11-10 DIAGNOSIS — E669 Obesity, unspecified: Secondary | ICD-10-CM

## 2023-11-10 DIAGNOSIS — Z6838 Body mass index (BMI) 38.0-38.9, adult: Secondary | ICD-10-CM

## 2023-11-10 DIAGNOSIS — E782 Mixed hyperlipidemia: Secondary | ICD-10-CM

## 2023-11-10 DIAGNOSIS — E119 Type 2 diabetes mellitus without complications: Secondary | ICD-10-CM

## 2023-11-10 DIAGNOSIS — E118 Type 2 diabetes mellitus with unspecified complications: Secondary | ICD-10-CM

## 2023-11-10 DIAGNOSIS — Z7984 Long term (current) use of oral hypoglycemic drugs: Secondary | ICD-10-CM

## 2023-11-10 MED ORDER — TIRZEPATIDE 12.5 MG/0.5ML ~~LOC~~ SOAJ
12.5000 mg | SUBCUTANEOUS | 0 refills | Status: DC
  Filled 2023-11-12: qty 2, 28d supply, fill #0

## 2023-11-10 NOTE — Progress Notes (Signed)
 Office: [phone]  /  Fax: [phone]  WEIGHT SUMMARY AND BIOMETRICS  Anthropometric Measurements Height: 5\' 4"  (1.626 m) Weight: 223 lb (101.2 kg) BMI (Calculated): 38.26 Weight at Last Visit: 224 lb Weight Lost Since Last Visit: 1 lb Weight Gained Since Last Visit: 0 Starting Weight: 276 lb Total Weight Loss (lbs): 54 lb (24.5 kg)   Body Composition  Body Fat %: 45.2 % Fat Mass (lbs): 101.2 lbs Muscle Mass (lbs): 116.4 lbs Total Body Water (lbs): 84.6 lbs Visceral Fat Rating : 13   Other Clinical Data Fasting: yes Labs: no Today's Visit #: 16 Starting Date: 07/21/22    Chief Complaint: OBESITY    History of Present Illness [name] is a 53 year old female with obesity, type 2 diabetes, and hyperlipidemia who presents for obesity treatment plan assessment and progress evaluation.  She is adhering to her category four eating plan 75% of the time and engages in 30 minutes of exercise four times a week, which includes both cardiovascular and strengthening activities. She has experienced a weight loss of one pound over the past four weeks and a total of 54 pounds since initiating her program. She reports feeling more energetic despite the recent minimal weight change.  Her type 2 diabetes is managed with metformin, Mounjaro, and [place]. Her most recent hemoglobin A1c is well controlled at 5.4, a significant improvement from a high of 11.3 a year and a half ago.  She has hyperlipidemia associated with her type 2 diabetes and is currently on Crestor. She continues to focus on dietary modifications, exercise, and weight loss to improve her lipid profile.      PHYSICAL EXAM:  Blood pressure 118/76, pulse 80, temperature 98.5 F (36.9 C), height 5\' 4"  (1.626 m), weight 223 lb (101.2 kg), SpO2 99%. Body mass index is 38.28 kg/m.  DIAGNOSTIC DATA REVIEWED:  BMET    Component Value Date/Time   NA 139 05/13/2023 1038   K 4.2 05/13/2023 1038   CL 102  05/13/2023 1038   CO2 24 05/13/2023 1038   GLUCOSE 76 05/13/2023 1038   GLUCOSE 176 (H) 07/20/2022 1334   BUN 21 05/13/2023 1038   CREATININE 0.64 05/13/2023 1038   CREATININE 0.60 05/26/2019 1616   CALCIUM 9.4 05/13/2023 1038   GFRNONAA >60 08/29/2019 1150   GFRNONAA 108 05/26/2019 1616   GFRAA >60 08/29/2019 1150   GFRAA 125 05/26/2019 1616   Lab Results  Component Value Date   HGBA1C 5.4 05/13/2023   HGBA1C 5.6 07/24/2015   Lab Results  Component Value Date   INSULIN 28.9 (H) 05/13/2023   INSULIN 50.6 (H) 08/06/2022   Lab Results  Component Value Date   TSH 2.400 05/13/2023   CBC    Component Value Date/Time   WBC 7.6 05/13/2023 1038   WBC 7.4 01/26/2022 0914   RBC 4.42 05/13/2023 1038   RBC 4.38 01/26/2022 0914   HGB 14.1 05/13/2023 1038   HCT 41.5 05/13/2023 1038   PLT 296 05/13/2023 1038   MCV 94 05/13/2023 1038   MCH 31.9 05/13/2023 1038   MCH 31.1 08/29/2019 1150   MCHC 34.0 05/13/2023 1038   MCHC 33.9 01/26/2022 0914   RDW 12.6 05/13/2023 1038   Iron Studies    Component Value Date/Time   FERRITIN 51.1 06/15/2016 1218   Lipid Panel     Component Value Date/Time   CHOL 118 05/13/2023 1038   TRIG 119 05/13/2023 1038   HDL 43 05/13/2023 1038   CHOLHDL 3  01/26/2022 0914   VLDL 32.8 01/26/2022 0914   LDLCALC 54 05/13/2023 1038   LDLDIRECT 118.0 01/08/2021 1438   Hepatic Function Panel     Component Value Date/Time   PROT 7.6 05/13/2023 1038   ALBUMIN 4.5 05/13/2023 1038   AST 29 05/13/2023 1038   ALT 31 05/13/2023 1038   ALKPHOS 100 05/13/2023 1038   BILITOT 0.7 05/13/2023 1038   BILIDIR 0.10 02/06/2020 0947      Component Value Date/Time   TSH 2.400 05/13/2023 1038   Nutritional Lab Results  Component Value Date   VD25OH 54.3 05/13/2023   VD25OH 60.6 12/10/2022   VD25OH 20.1 (L) 08/06/2022     Assessment and Plan Assessment & Plan Type 2 Diabetes Mellitus Type 2 diabetes is well-controlled with a recent hemoglobin A1c of 5.4,  down from a high of 11.3 a year and a half ago. Currently on metformin, Mounjaro, and [place]. Discussed the role of insulin in weight gain and the benefits of GLP-1 and GIP agonists in regulating blood sugar and preserving pancreatic function. Emphasized the importance of maintaining nutritional balance to prevent pancreas burnout and avoid insulin dependency. Highlighted that GLP-1 and GIP agonists help regulate insulin production and reduce liver sugar output, potentially preventing the need for insulin therapy in the future. - Continue metformin, Mounjaro, and Farxiga - Refill Mounjaro - Continue current diet and exercise regimen  Obesity Following a category four eating plan 75% of the time and exercising 30 minutes four times a week. Lost one pound in the last four weeks and a total of 54 pounds since starting the program. Weight loss plateau may be due to metabolic adaptation. Discussed importance of maintaining adequate nutrition to prevent metabolic slowdown. Encouraged to focus on overall health improvements rather than specific weight goals. Emphasized that most people take at least a year to lose 54 pounds and that rapid weight loss can trigger metabolic slowdown. Discussed the importance of muscle mass in maintaining metabolism and overall health. - Continue current diet and exercise regimen - Emphasize the importance of adequate nutrition to prevent metabolic slowdown - Monitor fat percentage, visceral fat rating, and muscle mass  Hyperlipidemia Hyperlipidemia related to type 2 diabetes, treated with Crestor. Working on diet, exercise, and weight loss to improve lipid panel. Discussed the impact of menopause on cardiovascular risk and the importance of continued lifestyle modifications to manage hyperlipidemia. - Continue Crestor - Continue working on diet, exercise, and weight loss   She was informed of the importance of frequent follow up visits to maximize her success with intensive  lifestyle modifications for her multiple health conditions.    Quillian Quince, MD

## 2023-11-11 ENCOUNTER — Other Ambulatory Visit (HOSPITAL_COMMUNITY): Payer: Self-pay

## 2023-11-12 ENCOUNTER — Other Ambulatory Visit (HOSPITAL_COMMUNITY): Payer: Self-pay

## 2023-11-17 ENCOUNTER — Other Ambulatory Visit: Payer: Self-pay

## 2023-11-26 ENCOUNTER — Other Ambulatory Visit (INDEPENDENT_AMBULATORY_CARE_PROVIDER_SITE_OTHER): Payer: Self-pay | Admitting: Family Medicine

## 2023-11-26 DIAGNOSIS — E118 Type 2 diabetes mellitus with unspecified complications: Secondary | ICD-10-CM

## 2023-11-26 DIAGNOSIS — E782 Mixed hyperlipidemia: Secondary | ICD-10-CM

## 2023-11-30 ENCOUNTER — Other Ambulatory Visit (HOSPITAL_COMMUNITY): Payer: Self-pay

## 2023-12-01 ENCOUNTER — Encounter (HOSPITAL_COMMUNITY): Payer: Self-pay

## 2023-12-01 ENCOUNTER — Other Ambulatory Visit (HOSPITAL_COMMUNITY): Payer: Self-pay

## 2023-12-06 ENCOUNTER — Other Ambulatory Visit (HOSPITAL_COMMUNITY): Payer: Self-pay

## 2023-12-09 ENCOUNTER — Ambulatory Visit (INDEPENDENT_AMBULATORY_CARE_PROVIDER_SITE_OTHER): Admitting: Family Medicine

## 2023-12-09 ENCOUNTER — Other Ambulatory Visit (HOSPITAL_COMMUNITY): Payer: Self-pay

## 2023-12-09 ENCOUNTER — Encounter (INDEPENDENT_AMBULATORY_CARE_PROVIDER_SITE_OTHER): Payer: Self-pay | Admitting: Family Medicine

## 2023-12-09 VITALS — BP 113/75 | HR 76 | Temp 98.0°F | Ht 64.0 in | Wt 228.0 lb

## 2023-12-09 DIAGNOSIS — I10 Essential (primary) hypertension: Secondary | ICD-10-CM

## 2023-12-09 DIAGNOSIS — E559 Vitamin D deficiency, unspecified: Secondary | ICD-10-CM

## 2023-12-09 DIAGNOSIS — E119 Type 2 diabetes mellitus without complications: Secondary | ICD-10-CM | POA: Diagnosis not present

## 2023-12-09 DIAGNOSIS — Z7984 Long term (current) use of oral hypoglycemic drugs: Secondary | ICD-10-CM

## 2023-12-09 DIAGNOSIS — E782 Mixed hyperlipidemia: Secondary | ICD-10-CM

## 2023-12-09 DIAGNOSIS — E785 Hyperlipidemia, unspecified: Secondary | ICD-10-CM | POA: Diagnosis not present

## 2023-12-09 DIAGNOSIS — E669 Obesity, unspecified: Secondary | ICD-10-CM

## 2023-12-09 DIAGNOSIS — E66813 Obesity, class 3: Secondary | ICD-10-CM

## 2023-12-09 DIAGNOSIS — Z7985 Long-term (current) use of injectable non-insulin antidiabetic drugs: Secondary | ICD-10-CM

## 2023-12-09 DIAGNOSIS — Z6839 Body mass index (BMI) 39.0-39.9, adult: Secondary | ICD-10-CM

## 2023-12-09 DIAGNOSIS — E118 Type 2 diabetes mellitus with unspecified complications: Secondary | ICD-10-CM

## 2023-12-09 MED ORDER — ROSUVASTATIN CALCIUM 20 MG PO TABS
20.0000 mg | ORAL_TABLET | Freq: Every day | ORAL | 0 refills | Status: DC
  Filled 2023-12-09: qty 30, 30d supply, fill #0

## 2023-12-09 MED ORDER — DAPAGLIFLOZIN PROPANEDIOL 10 MG PO TABS
10.0000 mg | ORAL_TABLET | Freq: Every day | ORAL | 0 refills | Status: DC
  Filled 2023-12-09: qty 30, 30d supply, fill #0

## 2023-12-09 MED ORDER — METFORMIN HCL 500 MG PO TABS
500.0000 mg | ORAL_TABLET | Freq: Two times a day (BID) | ORAL | 0 refills | Status: DC
  Filled 2023-12-09: qty 60, 30d supply, fill #0

## 2023-12-09 MED ORDER — TIRZEPATIDE 12.5 MG/0.5ML ~~LOC~~ SOAJ
12.5000 mg | SUBCUTANEOUS | 0 refills | Status: DC
  Filled 2023-12-09: qty 2, 28d supply, fill #0

## 2023-12-09 NOTE — Progress Notes (Signed)
 Office: [phone]  /  Fax: [phone]  WEIGHT SUMMARY AND BIOMETRICS  Anthropometric Measurements Height: 5\' 4"  (1.626 m) Weight: 228 lb (103.4 kg) BMI (Calculated): 39.12 Weight at Last Visit: 224 lb Weight Lost Since Last Visit: 0 Weight Gained Since Last Visit: 4 lb Starting Weight: 275 lb Total Weight Loss (lbs): 47 lb (21.3 kg) Peak Weight: 287 lb   Body Composition  Body Fat %: 46.3 % Fat Mass (lbs): 105.6 lbs Muscle Mass (lbs): 116.4 lbs Total Body Water (lbs): 86.4 lbs Visceral Fat Rating : 14   Other Clinical Data Fasting: yes Labs: no Today's Visit #: 17 Starting Date: 08/06/22    Chief Complaint: OBESITY    History of Present Illness [name] is a 53 year old female with obesity who presents for treatment and management of related comorbidities.  She has been adhering to a category four eating plan approximately 75% of the time and engages in physical activity, including walking and weightlifting for 30 minutes, four times a week. Despite these efforts, she has experienced a weight gain of four pounds over the last month, attributed to both fluid retention and fat, while maintaining her muscle mass.  Her last laboratory tests were conducted seven months ago, and she is due for a recheck. She is currently taking metformin , Farxiga , and Mounjaro  for type 2 diabetes, and Crestor  for hyperlipidemia. She requests refills for these medications.  She recently went on a trip to the beach where she overindulged in foods like pancakes, which she does not normally consume, although she used sugar-free syrup. During this time, she was out of her metformin  and Crestor  for a week, which she suspects may have contributed to her weight gain.  She is concerned about the recent weight gain, noting it is the largest increase since starting her weight loss program. She is trying to maintain her protein intake, aiming for at least 100 grams per day, but sometimes  struggles to meet this goal.      PHYSICAL EXAM:  Blood pressure 113/75, pulse 76, temperature 98 F (36.7 C), height 5\' 4"  (1.626 m), weight 228 lb (103.4 kg), SpO2 98%. Body mass index is 39.14 kg/m.  DIAGNOSTIC DATA REVIEWED:  BMET    Component Value Date/Time   NA 139 05/13/2023 1038   K 4.2 05/13/2023 1038   CL 102 05/13/2023 1038   CO2 24 05/13/2023 1038   GLUCOSE 76 05/13/2023 1038   GLUCOSE 176 (H) 07/20/2022 1334   BUN 21 05/13/2023 1038   CREATININE 0.64 05/13/2023 1038   CREATININE 0.60 05/26/2019 1616   CALCIUM  9.4 05/13/2023 1038   GFRNONAA >60 08/29/2019 1150   GFRNONAA 108 05/26/2019 1616   GFRAA >60 08/29/2019 1150   GFRAA 125 05/26/2019 1616   Lab Results  Component Value Date   HGBA1C 5.4 05/13/2023   HGBA1C 5.6 07/24/2015   Lab Results  Component Value Date   INSULIN  28.9 (H) 05/13/2023   INSULIN  50.6 (H) 08/06/2022   Lab Results  Component Value Date   TSH 2.400 05/13/2023   CBC    Component Value Date/Time   WBC 7.6 05/13/2023 1038   WBC 7.4 01/26/2022 0914   RBC 4.42 05/13/2023 1038   RBC 4.38 01/26/2022 0914   HGB 14.1 05/13/2023 1038   HCT 41.5 05/13/2023 1038   PLT 296 05/13/2023 1038   MCV 94 05/13/2023 1038   MCH 31.9 05/13/2023 1038   MCH 31.1 08/29/2019 1150   MCHC 34.0 05/13/2023 1038  MCHC 33.9 01/26/2022 0914   RDW 12.6 05/13/2023 1038   Iron Studies    Component Value Date/Time   FERRITIN 51.1 06/15/2016 1218   Lipid Panel     Component Value Date/Time   CHOL 118 05/13/2023 1038   TRIG 119 05/13/2023 1038   HDL 43 05/13/2023 1038   CHOLHDL 3 01/26/2022 0914   VLDL 32.8 01/26/2022 0914   LDLCALC 54 05/13/2023 1038   LDLDIRECT 118.0 01/08/2021 1438   Hepatic Function Panel     Component Value Date/Time   PROT 7.6 05/13/2023 1038   ALBUMIN 4.5 05/13/2023 1038   AST 29 05/13/2023 1038   ALT 31 05/13/2023 1038   ALKPHOS 100 05/13/2023 1038   BILITOT 0.7 05/13/2023 1038   BILIDIR 0.10 02/06/2020 0947       Component Value Date/Time   TSH 2.400 05/13/2023 1038   Nutritional Lab Results  Component Value Date   VD25OH 54.3 05/13/2023   VD25OH 60.6 12/10/2022   VD25OH 20.1 (L) 08/06/2022     Assessment and Plan Assessment & Plan Obesity Obesity management with a focus on weight loss. She has gained 4 pounds in the last month due to fluid retention and fat gain, while maintaining muscle mass. She adheres to the category four eating plan 75% of the time and exercises with walking and weights for 30 minutes, four times per week. Discussed the importance of protein intake to maintain metabolism and muscle mass, emphasizing the thermogenic effect of protein from real food versus supplements. Encouraged to find personal dietary compromises that maintain satisfaction while reducing calorie intake. - Continue category four eating plan - Increase protein intake to at least 100 grams per day, ideally 120 grams - Continue exercise regimen of walking and weights - Implement dietary strategies to reduce calorie intake while maintaining satisfaction  Type 2 diabetes mellitus Type 2 diabetes management with metformin , Farxiga , and Mounjaro . She was out of metformin  for a week, potentially contributing to weight gain. Emphasized the importance of medication adherence. - Refill metformin , Farxiga , and Mounjaro  - Order labs for diabetes management - Continue category four eating plan - Increase protein intake to at least 100 grams per day, ideally 120 grams - Continue exercise regimen of walking and weights  Hypertension Hypertension management as part of obesity-related comorbidities. - Order labs for hypertension management - Continue category four eating plan - Increase protein intake to at least 100 grams per day, ideally 120 grams - Continue exercise regimen of walking and weights  Hyperlipidemia Hyperlipidemia management with Crestor . She was out of Crestor  for a week. Emphasized the  importance of medication adherence. - Refill Crestor  - Order labs for hyperlipidemia management - Continue category four eating plan - Increase protein intake to at least 100 grams per day, ideally 120 grams - Continue exercise regimen of walking and weights  Vitamin D  deficiency Vitamin D  deficiency management. - Order labs for vitamin D  levels    She was informed of the importance of frequent follow up visits to maximize her success with intensive lifestyle modifications for her multiple health conditions.    Jasmine Mesi, MD

## 2023-12-10 ENCOUNTER — Other Ambulatory Visit: Payer: Self-pay | Admitting: Family Medicine

## 2023-12-10 DIAGNOSIS — E039 Hypothyroidism, unspecified: Secondary | ICD-10-CM

## 2023-12-13 LAB — LIPID PANEL WITH LDL/HDL RATIO
Cholesterol, Total: 204 mg/dL — ABNORMAL HIGH (ref 100–199)
HDL: 41 mg/dL (ref >39)
LDL Chol Calc (NIH): 135 mg/dL — ABNORMAL HIGH (ref 0–99)
LDL/HDL Ratio: 3.3 ratio — ABNORMAL HIGH (ref 0.0–3.2)
Triglycerides: 155 mg/dL — ABNORMAL HIGH (ref 0–149)
VLDL Cholesterol Cal: 28 mg/dL (ref 5–40)

## 2023-12-13 LAB — CMP14+EGFR
ALT: 22 IU/L (ref 0–32)
AST: 16 IU/L (ref 0–40)
Albumin: 4.3 g/dL (ref 3.8–4.9)
Alkaline Phosphatase: 88 IU/L (ref 44–121)
BUN/Creatinine Ratio: 36 — ABNORMAL HIGH (ref 9–23)
BUN: 22 mg/dL (ref 6–24)
Bilirubin Total: 0.5 mg/dL (ref 0.0–1.2)
CO2: 21 mmol/L (ref 20–29)
Calcium: 9 mg/dL (ref 8.7–10.2)
Chloride: 103 mmol/L (ref 96–106)
Creatinine, Ser: 0.61 mg/dL (ref 0.57–1.00)
Globulin, Total: 2.7 g/dL (ref 1.5–4.5)
Glucose: 77 mg/dL (ref 70–99)
Potassium: 4.1 mmol/L (ref 3.5–5.2)
Sodium: 139 mmol/L (ref 134–144)
Total Protein: 7 g/dL (ref 6.0–8.5)
eGFR: 107 mL/min/{1.73_m2} (ref >59)

## 2023-12-13 LAB — HEMOGLOBIN A1C
Est. average glucose Bld gHb Est-mCnc: 105 mg/dL
Hgb A1c MFr Bld: 5.3 % (ref 4.8–5.6)

## 2023-12-13 LAB — INSULIN, RANDOM: INSULIN: 28.2 u[IU]/mL — ABNORMAL HIGH (ref 2.6–24.9)

## 2023-12-13 LAB — VITAMIN D 25 HYDROXY (VIT D DEFICIENCY, FRACTURES): Vit D, 25-Hydroxy: 39.8 ng/mL (ref 30.0–100.0)

## 2023-12-13 LAB — VITAMIN B12: Vitamin B-12: 746 pg/mL (ref 232–1245)

## 2023-12-28 ENCOUNTER — Ambulatory Visit: Payer: Self-pay | Admitting: Internal Medicine

## 2023-12-28 DIAGNOSIS — I77819 Aortic ectasia, unspecified site: Secondary | ICD-10-CM

## 2024-01-04 ENCOUNTER — Ambulatory Visit (INDEPENDENT_AMBULATORY_CARE_PROVIDER_SITE_OTHER): Admitting: Family Medicine

## 2024-01-04 ENCOUNTER — Encounter (INDEPENDENT_AMBULATORY_CARE_PROVIDER_SITE_OTHER): Payer: Self-pay | Admitting: Family Medicine

## 2024-01-04 ENCOUNTER — Other Ambulatory Visit (HOSPITAL_COMMUNITY): Payer: Self-pay

## 2024-01-04 VITALS — BP 117/80 | HR 72 | Temp 97.9°F | Ht 64.0 in | Wt 222.0 lb

## 2024-01-04 DIAGNOSIS — E119 Type 2 diabetes mellitus without complications: Secondary | ICD-10-CM | POA: Diagnosis not present

## 2024-01-04 DIAGNOSIS — E782 Mixed hyperlipidemia: Secondary | ICD-10-CM

## 2024-01-04 DIAGNOSIS — E559 Vitamin D deficiency, unspecified: Secondary | ICD-10-CM | POA: Diagnosis not present

## 2024-01-04 DIAGNOSIS — E118 Type 2 diabetes mellitus with unspecified complications: Secondary | ICD-10-CM

## 2024-01-04 DIAGNOSIS — E669 Obesity, unspecified: Secondary | ICD-10-CM

## 2024-01-04 DIAGNOSIS — Z7984 Long term (current) use of oral hypoglycemic drugs: Secondary | ICD-10-CM

## 2024-01-04 DIAGNOSIS — Z6839 Body mass index (BMI) 39.0-39.9, adult: Secondary | ICD-10-CM

## 2024-01-04 DIAGNOSIS — Z7985 Long-term (current) use of injectable non-insulin antidiabetic drugs: Secondary | ICD-10-CM

## 2024-01-04 DIAGNOSIS — E66813 Obesity, class 3: Secondary | ICD-10-CM

## 2024-01-04 DIAGNOSIS — E038 Other specified hypothyroidism: Secondary | ICD-10-CM

## 2024-01-04 DIAGNOSIS — E785 Hyperlipidemia, unspecified: Secondary | ICD-10-CM | POA: Diagnosis not present

## 2024-01-04 DIAGNOSIS — Z6838 Body mass index (BMI) 38.0-38.9, adult: Secondary | ICD-10-CM

## 2024-01-04 MED ORDER — DAPAGLIFLOZIN PROPANEDIOL 10 MG PO TABS
10.0000 mg | ORAL_TABLET | Freq: Every day | ORAL | 0 refills | Status: DC
  Filled 2024-01-04: qty 30, 30d supply, fill #0

## 2024-01-04 MED ORDER — METFORMIN HCL 500 MG PO TABS
500.0000 mg | ORAL_TABLET | Freq: Two times a day (BID) | ORAL | 0 refills | Status: DC
  Filled 2024-01-04: qty 60, 30d supply, fill #0

## 2024-01-04 MED ORDER — VITAMIN D (ERGOCALCIFEROL) 1.25 MG (50000 UNIT) PO CAPS
50000.0000 [IU] | ORAL_CAPSULE | ORAL | 0 refills | Status: DC
  Filled 2024-01-04: qty 5, 35d supply, fill #0

## 2024-01-04 MED ORDER — ROSUVASTATIN CALCIUM 20 MG PO TABS
20.0000 mg | ORAL_TABLET | Freq: Every day | ORAL | 0 refills | Status: DC
  Filled 2024-01-04: qty 30, 30d supply, fill #0

## 2024-01-04 MED ORDER — TIRZEPATIDE 12.5 MG/0.5ML ~~LOC~~ SOAJ
12.5000 mg | SUBCUTANEOUS | 0 refills | Status: DC
  Filled 2024-01-04: qty 2, 28d supply, fill #0

## 2024-01-04 NOTE — Progress Notes (Signed)
 Office: [phone]  /  Fax: [phone]  WEIGHT SUMMARY AND BIOMETRICS  Anthropometric Measurements Height: 5\' 4"  (1.626 m) Weight: 222 lb (100.7 kg) BMI (Calculated): 38.09 Weight at Last Visit: 222 lb Weight Lost Since Last Visit: 6 lb Weight Gained Since Last Visit: 0 Starting Weight: 275 lb Total Weight Loss (lbs): 53 lb (24 kg) Peak Weight: 287 lb   Body Composition  Body Fat %: 45.1 % Fat Mass (lbs): 100.4 lbs Muscle Mass (lbs): 116.2 lbs Total Body Water (lbs): 84 lbs Visceral Fat Rating : 13   Other Clinical Data Fasting: yes Labs: no Today's Visit #: 18 Starting Date: 08/06/22    Chief Complaint: OBESITY   History of Present Illness [name] is a 53 year old female with obesity, type 2 diabetes, and hyperlipidemia who presents for obesity treatment and progress assessment.  She has been adhering to the category four eating plan 75% of the time and engages in walking for exercise 30 to 40 minutes, four times per week. She has experienced a weight loss of six pounds over the past month. However, she reports that her hunger is not well controlled and feels higher than in the past. Bowel movements have become easier, although not always comfortable.  She has a history of type 2 diabetes and is currently on Mounjaro , metformin , and Farxiga , in addition to dietary and exercise modifications. She requests a refill for these medications. Her most recent hemoglobin A1c was 5.3, and her insulin  levels have decreased from 50 to the 20s. She ran out of metformin  the week before her lab tests.  She has hyperlipidemia and is on Crestor , for which she also requests a refill. Despite consuming lean meats and becoming a daily coffee drinker with cream, she notes a recent increase in her cholesterol levels. She has not missed doses of Crestor .  She reports a decrease in her vitamin D  levels and takes a daily vitamin D  supplement as part of a calcium  and vitamin D   combination.  Her BUN was on the high end of normal, indicating she was on the drier end of normal hydration. She consumes coffee in the morning, caffeine-free diet Coke in the afternoon, and two bottles of water throughout the day. She acknowledges the need to increase her water intake as she does not often feel thirsty.  She recently attended her sister's wedding, where she was heavily involved in the preparations, which left her feeling exhausted.      PHYSICAL EXAM:  Blood pressure 117/80, pulse 72, temperature 97.9 F (36.6 C), height 5\' 4"  (1.626 m), weight 222 lb (100.7 kg), SpO2 98%. Body mass index is 38.11 kg/m.  DIAGNOSTIC DATA REVIEWED:  BMET    Component Value Date/Time   NA 139 12/09/2023 0753   K 4.1 12/09/2023 0753   CL 103 12/09/2023 0753   CO2 21 12/09/2023 0753   GLUCOSE 77 12/09/2023 0753   GLUCOSE 176 (H) 07/20/2022 1334   BUN 22 12/09/2023 0753   CREATININE 0.61 12/09/2023 0753   CREATININE 0.60 05/26/2019 1616   CALCIUM  9.0 12/09/2023 0753   GFRNONAA >60 08/29/2019 1150   GFRNONAA 108 05/26/2019 1616   GFRAA >60 08/29/2019 1150   GFRAA 125 05/26/2019 1616   Lab Results  Component Value Date   HGBA1C 5.3 12/09/2023   HGBA1C 5.6 07/24/2015   Lab Results  Component Value Date   INSULIN  28.2 (H) 12/09/2023   INSULIN  50.6 (H) 08/06/2022   Lab Results  Component Value Date  TSH 2.400 05/13/2023   CBC    Component Value Date/Time   WBC 7.6 05/13/2023 1038   WBC 7.4 01/26/2022 0914   RBC 4.42 05/13/2023 1038   RBC 4.38 01/26/2022 0914   HGB 14.1 05/13/2023 1038   HCT 41.5 05/13/2023 1038   PLT 296 05/13/2023 1038   MCV 94 05/13/2023 1038   MCH 31.9 05/13/2023 1038   MCH 31.1 08/29/2019 1150   MCHC 34.0 05/13/2023 1038   MCHC 33.9 01/26/2022 0914   RDW 12.6 05/13/2023 1038   Iron Studies    Component Value Date/Time   FERRITIN 51.1 06/15/2016 1218   Lipid Panel     Component Value Date/Time   CHOL 204 (H) 12/09/2023 0753   TRIG  155 (H) 12/09/2023 0753   HDL 41 12/09/2023 0753   CHOLHDL 3 01/26/2022 0914   VLDL 32.8 01/26/2022 0914   LDLCALC 135 (H) 12/09/2023 0753   LDLDIRECT 118.0 01/08/2021 1438   Hepatic Function Panel     Component Value Date/Time   PROT 7.0 12/09/2023 0753   ALBUMIN 4.3 12/09/2023 0753   AST 16 12/09/2023 0753   ALT 22 12/09/2023 0753   ALKPHOS 88 12/09/2023 0753   BILITOT 0.5 12/09/2023 0753   BILIDIR 0.10 02/06/2020 0947      Component Value Date/Time   TSH 2.400 05/13/2023 1038   Nutritional Lab Results  Component Value Date   VD25OH 39.8 12/09/2023   VD25OH 54.3 05/13/2023   VD25OH 60.6 12/10/2022     Assessment and Plan Assessment & Plan Obesity She has been adhering to the category four eating plan 75% of the time and exercises regularly, walking 30-40 minutes four times per week. She has lost six pounds in the last month but reports increased hunger, possibly due to insufficient calorie or protein intake or increased simple carbohydrates leading to higher insulin  levels. The body's adaptation mechanisms against weight loss were explained, emphasizing the importance of maintaining adequate protein intake and calorie levels to prevent increased hunger. The category four plan is adequate in protein, and deviations should still meet nutritional criteria. - Continue category four eating plan - Maintain exercise routine - Ensure calorie intake is between 1500-1800 calories per day - Ensure protein intake is at least 120 grams per day - Keep a food journal to monitor nutritional intake  Type 2 Diabetes Mellitus Her type 2 diabetes is well-controlled with an A1c of 5.3. She is on Mounjaro , metformin , and Farxiga . Her insulin  levels have decreased from 50 to the 20s, indicating improved control without increased pancreatic workload. The importance of maintaining good control while reducing pancreatic workload was discussed. - Refill Mounjaro , metformin , and Farxiga  - Order  annual urine microalbumin test  Hyperlipidemia She is on Crestor  for hyperlipidemia. Recent labs showed a dramatic increase in LDL and triglycerides, which is unusual. She has been compliant with Crestor . A possible lab error or other factors affecting the results are suspected, and plans to recheck the lipid panel were made. The potential impact of thyroid  dysfunction on cholesterol levels was discussed. She has a hx od hypothyroid dz on armour thyroid  - Refill Crestor  - Order repeat lipid panel - Verify current dose of Crestor  - Order TSH to rule out thyroid  dysfunction affecting cholesterol levels  Vitamin D  Deficiency Her vitamin D  levels have decreased. She is currently taking a multivitamin with calcium  and vitamin D . A prescription vitamin D  supplement will be added to her regimen. - Prescribe additional vitamin D  supplement to be taken once  a week   Follow-up She is scheduled for follow-up appointments to monitor her conditions and progress. - Schedule follow-up appointment on July 10th - Schedule additional follow-up appointment on August 5th at 8 AM   She was informed of the importance of frequent follow up visits to maximize her success with intensive lifestyle modifications for her multiple health conditions.    Jasmine Mesi, MD

## 2024-01-05 ENCOUNTER — Ambulatory Visit (INDEPENDENT_AMBULATORY_CARE_PROVIDER_SITE_OTHER): Payer: Self-pay | Admitting: Family Medicine

## 2024-01-05 LAB — MICROALBUMIN / CREATININE URINE RATIO
Creatinine, Urine: 203.5 mg/dL
Microalb/Creat Ratio: 8 mg/g{creat} (ref 0–29)
Microalbumin, Urine: 16.8 ug/mL

## 2024-01-05 LAB — LIPID PANEL WITH LDL/HDL RATIO
Cholesterol, Total: 128 mg/dL (ref 100–199)
HDL: 44 mg/dL (ref >39)
LDL Chol Calc (NIH): 62 mg/dL (ref 0–99)
LDL/HDL Ratio: 1.4 ratio (ref 0.0–3.2)
Triglycerides: 120 mg/dL (ref 0–149)
VLDL Cholesterol Cal: 22 mg/dL (ref 5–40)

## 2024-01-05 LAB — TSH: TSH: 3.67 u[IU]/mL (ref 0.450–4.500)

## 2024-01-17 ENCOUNTER — Encounter: Payer: Self-pay | Admitting: Physician Assistant

## 2024-01-17 ENCOUNTER — Ambulatory Visit (INDEPENDENT_AMBULATORY_CARE_PROVIDER_SITE_OTHER): Admitting: Physician Assistant

## 2024-01-17 VITALS — BP 139/89 | HR 86 | Ht 64.0 in | Wt 225.0 lb

## 2024-01-17 DIAGNOSIS — M5412 Radiculopathy, cervical region: Secondary | ICD-10-CM

## 2024-01-17 DIAGNOSIS — I10 Essential (primary) hypertension: Secondary | ICD-10-CM | POA: Diagnosis not present

## 2024-01-17 MED ORDER — CELECOXIB 100 MG PO CAPS: 100.0000 mg | ORAL_CAPSULE | Freq: Two times a day (BID) | ORAL | 0 refills | Status: DC

## 2024-01-17 MED ORDER — CYCLOBENZAPRINE HCL 10 MG PO TABS: 10.0000 mg | ORAL_TABLET | Freq: Three times a day (TID) | ORAL | 0 refills | Status: DC | PRN

## 2024-01-17 NOTE — Progress Notes (Signed)
 Established patient visit   Patient: [name]   DOB: January 04, 1971   53 y.o. Female  MRN: [id number] Visit Date: 01/17/2024  Today's healthcare provider: Trenton Frock, PA-C   Cc. High blood pressure, headache, neck pain, lightheadedness  Subjective     Discussed the use of AI scribe software for clinical note transcription with the patient, who gave verbal consent to proceed.  History of Present Illness   [name] is a 53 year old female who presents with headache and elevated blood pressure.  She felt an odd sensation/pain in her right calf last Wednesday, reports this has happened before and she had a DVT scan that was negative.  She experienced a headache on Friday, located on the right side of her head, radiating into her neck and shoulder, with piercing pain and nausea. She took a nurtec which relieved the headache but the neck pain persists.   Today, she feels clammy and experienced a sensation in her jaw and fingers, with lightheadedness but no dizziness. Her blood pressure was elevated 157/107 so she took a 1/2 of losartan  earlier today.  She also feels a numbness in her 4th and 5th fingers in her right hand. She also took a negative COVID test, and a Nurtec today.   She took a baby aspirin as well.   No recent trauma, car accidents, rollercoasters or chiropractic adjustments. No light sensitivity or vision changes.       Medications: Outpatient Medications Prior to Visit  Medication Sig   dapagliflozin  propanediol (FARXIGA ) 10 MG TABS tablet Take 1 tablet (10 mg total) by mouth daily before breakfast.   losartan  (COZAAR ) 25 MG tablet Take 1/2 tablet (12.5 mg total) by mouth daily.   Melatonin Gummies 2.5 MG CHEW Chew 2.5 mg by mouth at bedtime.    metFORMIN  (GLUCOPHAGE ) 500 MG tablet Take 1 tablet (500 mg total) by mouth 2 (two) times daily with a meal. Appt for further refills   Multiple Vitamins-Minerals (MULTIVITAMIN WITH MINERALS) tablet Take 1  tablet by mouth at bedtime. Reported on 07/24/2015   ondansetron  (ZOFRAN -ODT) 8 MG disintegrating tablet Take 1 tablet (8 mg total) by mouth every 8 (eight) hours as needed for nausea or vomiting.   Rimegepant Sulfate (NURTEC) 75 MG TBDP Take 75 mg by mouth daily as needed.   rosuvastatin  (CRESTOR ) 20 MG tablet Take 1 tablet (20 mg total) by mouth daily.   thyroid  (NP THYROID ) 30 MG tablet Take 3 tablets (90 mg total) by mouth daily before breakfast.   tirzepatide  (MOUNJARO ) 12.5 MG/0.5ML Pen Inject 12.5 mg into the skin once a week.   Vitamin D , Ergocalciferol , (DRISDOL ) 1.25 MG (50000 UNIT) CAPS capsule Take 1 capsule (50,000 Units total) by mouth every 7 (seven) days.   dicyclomine  (BENTYL ) 10 MG capsule Take 1 20-30 minutes before breakfast and dinner (Patient not taking: Reported on 01/17/2024)   [DISCONTINUED] cyclobenzaprine  (FLEXERIL ) 10 MG tablet Take 1 tablet (10 mg total) by mouth 3 (three) times daily as needed for muscle spasms   No facility-administered medications prior to visit.    Review of Systems  Constitutional:  Positive for fatigue. Negative for fever.  Respiratory:  Negative for cough and shortness of breath.   Cardiovascular:  Negative for chest pain and leg swelling.  Gastrointestinal:  Negative for abdominal pain.  Musculoskeletal:  Positive for neck pain.  Neurological:  Positive for light-headedness, numbness and headaches. Negative for dizziness.        Objective  BP 139/89   Pulse 86   Ht 5' 4 (1.626 m)   Wt 225 lb (102.1 kg)   BMI 38.62 kg/m     Physical Exam Constitutional:      General: She is awake.     Appearance: She is well-developed.  HENT:     Head: Normocephalic.   Eyes:     Conjunctiva/sclera: Conjunctivae normal.    Cardiovascular:     Rate and Rhythm: Normal rate and regular rhythm.     Heart sounds: Normal heart sounds.  Pulmonary:     Effort: Pulmonary effort is normal.     Breath sounds: Normal breath sounds.    Musculoskeletal:     Cervical back: Normal range of motion.   Skin:    General: Skin is warm.   Neurological:     General: No focal deficit present.     Mental Status: She is alert and oriented to person, place, and time.     Motor: No weakness.   Psychiatric:        Attention and Perception: Attention normal.        Mood and Affect: Mood normal.        Speech: Speech normal.        Behavior: Behavior is cooperative.     No results found for any visits on 01/17/24.  Assessment & Plan    Cervical radiculopathy Normal neuro exam. Reviewed ED recommendations w/ pt .  - Prescribe cyclobenzaprine  to be taken three times a day if needed, preferably at bedtime due to potential drowsiness. - Prescribe Celebrex a to be taken twice a day for up to five days. - Advise against combining Celebrex with other NSAIDs such as ibuprofen, Motrin, Advil, or Aleve. - Recommend using Tylenol  for additional pain relief if needed. - Suggest using warm compresses and gentle neck stretching exercises at home.  -     Cyclobenzaprine  HCl; Take 1 tablet (10 mg total) by mouth 3 (three) times daily as needed for muscle spasms.  Dispense: 30 tablet; Refill: 0 -     Celecoxib; Take 1 capsule (100 mg total) by mouth 2 (two) times daily.  Dispense: 10 capsule; Refill: 0  Essential hypertension - Monitor blood pressure at home. - If blood pressure is 120/80 mmHg or lower, skip the evening dose of blood pressure medication. - If blood pressure remains elevated, take the usual dose of blood pressure medication.       Return if symptoms worsen or fail to improve.       Trenton Frock, PA-C  [facility] [phone] (phone) [phone] (fax)  [facility]

## 2024-01-21 NOTE — Progress Notes (Unsigned)
 [facility] [address] [place], [place] [address] [phone]  Date:  01/26/2024   Name:  [name]   DOB:  05-14-1971   MRN:  [id number]  PCP:  Watt Harlene BROCKS, MD    Chief Complaint: No chief complaint on file.   History of Present Illness:  [name] is a 53 y.o. very pleasant female patient who presents with the following:  Patient seen today for physical exam.  Most recent visit with myself was in December 2023 History of diabetes, hypothyroidism, obesity, benign ovarian tumor , elevated coronary calcium   She has been following up regularly with the [facility], most recent visit earlier this month-she is down a bit over 50 pounds She is using Mounjaro  12.5  She saw my partner Manuelita Flatness just recently with neck pain  She is also seen by cardiology, saw Dr. Loni in [place] an echo in May which looked good  Pneumonia vaccine-can give dose of Prevnar 20 Eye exam Due for tetanus booster But exam due Mammogram-last year her screening was abnormal, she had a diagnostic on February 22, 2023 which was reassuring Colonoscopy completed 2024 Pap smear-status post hysterectomy  Recent lipids on chart, looks great Lab Results  Component Value Date   HGBA1C 5.3 12/09/2023    Patient Active Problem List   Diagnosis Date Noted   LVH (left ventricular hypertrophy) 09/15/2023   BMI 38.0-38.9,adult 04/15/2023   Other Specified Feeding or Eating Disorder, Emotional Eating Behaviors 03/15/2023   Nausea 01/19/2023   Grief reaction 12/10/2022   Other constipation 11/12/2022   Caregiver stress 10/08/2022   Vitamin D  deficiency 08/20/2022   Other fatigue 08/06/2022   SOBOE (shortness of breath on exertion) 08/06/2022   Mixed hyperlipidemia 08/06/2022   Controlled type 2 diabetes mellitus with complication, without long-term current use of insulin  (HCC) 08/06/2022   OSA  (obstructive sleep apnea) 08/06/2022   Essential hypertension 08/06/2022   Depression 08/06/2022   History of benign ovarian tumor 05/24/2019   History of colonic polyps    H/O colonoscopy with polypectomy    Acute lower GI bleeding 02/16/2018   Acute blood loss anemia 02/16/2018   Morbid obesity (HCC) with starting BMI 47 07/07/2013   Hypothyroidism 09/27/2011    Past Medical History:  Diagnosis Date   Anxiety    Benign ovarian tumor 02/2011   17 pound benign tumor- removed   Depression    Diabetes 1.5, managed as type 2 (HCC)    Fatty liver    High cholesterol    Hypertension    Hypothyroidism    Personal history of colonic polyps    Skin cancer    Sleep apnea    Thyroid  disease    Vitamin D  deficiency     Past Surgical History:  Procedure Laterality Date   ABDOMINAL HYSTERECTOMY  2012   one ovary remains- due to giant benign ovarian cyst (17 lbs)   COLONOSCOPY WITH PROPOFOL  N/A 02/16/2018   Procedure: COLONOSCOPY WITH PROPOFOL ;  Surgeon: Albertus Gordy HERO, MD;  Location: [facility];  Service: Gastroenterology;  Laterality: N/A;   OVARIAN CYST REMOVAL      Social History   Tobacco Use   Smoking status: Never   Smokeless tobacco: Never  Vaping Use   Vaping status: Never Used  Substance Use Topics   Alcohol use: No    Alcohol/week: 0.0 standard drinks of alcohol    Comment: socially  Drug use: No    Family History  Problem Relation Age of Onset   Cancer Mother    COPD Mother    Congestive Heart Failure Mother    Lung cancer Mother        highly suspected, waiting on definitive bx 03/14/18   Thyroid  disease Father    Heart disease Father    Stroke Father    Heart attack Father    Hyperlipidemia Father    Hypertension Father    Colon polyps Father        benign   Obesity Father    Gallbladder disease Maternal Grandmother    Stroke Paternal Grandmother    Heart disease Paternal Grandfather    Heart attack Paternal Grandfather    Colon cancer Neg Hx     Stomach cancer Neg Hx    Esophageal cancer Neg Hx    Rectal cancer Neg Hx     Allergies  Allergen Reactions   Levothyroxine Sodium Anxiety and Palpitations   Lisinopril  Cough   Levothyroxine Palpitations    Medication list has been reviewed and updated.  Current Outpatient Medications on File Prior to Visit  Medication Sig Dispense Refill   celecoxib (CELEBREX) 100 MG capsule Take 1 capsule (100 mg total) by mouth 2 (two) times daily. 10 capsule 0   cyclobenzaprine  (FLEXERIL ) 10 MG tablet Take 1 tablet (10 mg total) by mouth 3 (three) times daily as needed for muscle spasms. 30 tablet 0   dapagliflozin  propanediol (FARXIGA ) 10 MG TABS tablet Take 1 tablet (10 mg total) by mouth daily before breakfast. 30 tablet 0   dicyclomine  (BENTYL ) 10 MG capsule Take 1 20-30 minutes before breakfast and dinner (Patient not taking: Reported on 01/17/2024) 60 capsule 3   losartan  (COZAAR ) 25 MG tablet Take 1/2 tablet (12.5 mg total) by mouth daily. 90 tablet 1   Melatonin Gummies 2.5 MG CHEW Chew 2.5 mg by mouth at bedtime.      metFORMIN  (GLUCOPHAGE ) 500 MG tablet Take 1 tablet (500 mg total) by mouth 2 (two) times daily with a meal. Appt for further refills 60 tablet 0   Multiple Vitamins-Minerals (MULTIVITAMIN WITH MINERALS) tablet Take 1 tablet by mouth at bedtime. Reported on 07/24/2015     ondansetron  (ZOFRAN -ODT) 8 MG disintegrating tablet Take 1 tablet (8 mg total) by mouth every 8 (eight) hours as needed for nausea or vomiting. 20 tablet 0   Rimegepant Sulfate (NURTEC) 75 MG TBDP Take 75 mg by mouth daily as needed. 16 tablet 5   rosuvastatin  (CRESTOR ) 20 MG tablet Take 1 tablet (20 mg total) by mouth daily. 30 tablet 0   thyroid  (NP THYROID ) 30 MG tablet Take 3 tablets (90 mg total) by mouth daily before breakfast. 90 tablet 0   tirzepatide  (MOUNJARO ) 12.5 MG/0.5ML Pen Inject 12.5 mg into the skin once a week. 2 mL 0   Vitamin D , Ergocalciferol , (DRISDOL ) 1.25 MG (50000 UNIT) CAPS capsule  Take 1 capsule (50,000 Units total) by mouth every 7 (seven) days. 5 capsule 0   No current facility-administered medications on file prior to visit.    Review of Systems:  As per HPI- otherwise negative.   Physical Examination: There were no vitals filed for this visit. There were no vitals filed for this visit. There is no height or weight on file to calculate BMI. Ideal Body Weight:    GEN: no acute distress. HEENT: Atraumatic, Normocephalic.  Ears and Nose: No external deformity. CV: RRR, No M/G/R. No JVD.  No thrill. No extra heart sounds. PULM: CTA B, no wheezes, crackles, rhonchi. No retractions. No resp. distress. No accessory muscle use. ABD: S, NT, ND, +BS. No rebound. No HSM. EXTR: No c/c/e PSYCH: Normally interactive. Conversant.  Foot exam:   Assessment and Plan: *** Patient seen today for physical exam.  Encouraged healthy diet and exercise routine  Signed Harlene Schroeder, MD

## 2024-01-21 NOTE — Patient Instructions (Addendum)
It was great to see you again today!  

## 2024-01-26 ENCOUNTER — Encounter: Payer: Self-pay | Admitting: Family Medicine

## 2024-01-26 ENCOUNTER — Ambulatory Visit (INDEPENDENT_AMBULATORY_CARE_PROVIDER_SITE_OTHER): Admitting: Family Medicine

## 2024-01-26 VITALS — BP 112/74 | HR 89 | Ht 64.0 in | Wt 224.6 lb

## 2024-01-26 DIAGNOSIS — E118 Type 2 diabetes mellitus with unspecified complications: Secondary | ICD-10-CM | POA: Diagnosis not present

## 2024-01-26 DIAGNOSIS — Z0000 Encounter for general adult medical examination without abnormal findings: Secondary | ICD-10-CM

## 2024-01-26 DIAGNOSIS — G4733 Obstructive sleep apnea (adult) (pediatric): Secondary | ICD-10-CM | POA: Diagnosis not present

## 2024-01-26 DIAGNOSIS — E782 Mixed hyperlipidemia: Secondary | ICD-10-CM

## 2024-01-26 DIAGNOSIS — E038 Other specified hypothyroidism: Secondary | ICD-10-CM | POA: Diagnosis not present

## 2024-01-26 DIAGNOSIS — Z23 Encounter for immunization: Secondary | ICD-10-CM | POA: Diagnosis not present

## 2024-01-26 DIAGNOSIS — Z7985 Long-term (current) use of injectable non-insulin antidiabetic drugs: Secondary | ICD-10-CM

## 2024-01-27 ENCOUNTER — Other Ambulatory Visit: Payer: Self-pay | Admitting: Family Medicine

## 2024-01-27 DIAGNOSIS — Z1231 Encounter for screening mammogram for malignant neoplasm of breast: Secondary | ICD-10-CM

## 2024-02-10 ENCOUNTER — Ambulatory Visit

## 2024-02-10 ENCOUNTER — Encounter (INDEPENDENT_AMBULATORY_CARE_PROVIDER_SITE_OTHER): Payer: Self-pay | Admitting: Family Medicine

## 2024-02-10 ENCOUNTER — Ambulatory Visit (INDEPENDENT_AMBULATORY_CARE_PROVIDER_SITE_OTHER): Admitting: Family Medicine

## 2024-02-10 ENCOUNTER — Other Ambulatory Visit (HOSPITAL_COMMUNITY): Payer: Self-pay

## 2024-02-10 VITALS — BP 124/79 | HR 73 | Temp 97.9°F | Ht 64.0 in | Wt 229.0 lb

## 2024-02-10 DIAGNOSIS — E782 Mixed hyperlipidemia: Secondary | ICD-10-CM

## 2024-02-10 DIAGNOSIS — E559 Vitamin D deficiency, unspecified: Secondary | ICD-10-CM

## 2024-02-10 DIAGNOSIS — E119 Type 2 diabetes mellitus without complications: Secondary | ICD-10-CM

## 2024-02-10 DIAGNOSIS — Z6839 Body mass index (BMI) 39.0-39.9, adult: Secondary | ICD-10-CM

## 2024-02-10 DIAGNOSIS — E785 Hyperlipidemia, unspecified: Secondary | ICD-10-CM | POA: Diagnosis not present

## 2024-02-10 DIAGNOSIS — E118 Type 2 diabetes mellitus with unspecified complications: Secondary | ICD-10-CM

## 2024-02-10 DIAGNOSIS — E66813 Obesity, class 3: Secondary | ICD-10-CM

## 2024-02-10 DIAGNOSIS — Z7985 Long-term (current) use of injectable non-insulin antidiabetic drugs: Secondary | ICD-10-CM

## 2024-02-10 DIAGNOSIS — E669 Obesity, unspecified: Secondary | ICD-10-CM

## 2024-02-10 DIAGNOSIS — Z7984 Long term (current) use of oral hypoglycemic drugs: Secondary | ICD-10-CM

## 2024-02-10 MED ORDER — TIRZEPATIDE 12.5 MG/0.5ML ~~LOC~~ SOAJ
12.5000 mg | SUBCUTANEOUS | 0 refills | Status: DC
Start: 2024-02-10 — End: 2024-03-07
  Filled 2024-02-10: qty 2, 28d supply, fill #0

## 2024-02-10 MED ORDER — VITAMIN D (ERGOCALCIFEROL) 1.25 MG (50000 UNIT) PO CAPS
50000.0000 [IU] | ORAL_CAPSULE | ORAL | 0 refills | Status: DC
  Filled 2024-02-10: qty 5, 35d supply, fill #0

## 2024-02-10 MED ORDER — DAPAGLIFLOZIN PROPANEDIOL 10 MG PO TABS
10.0000 mg | ORAL_TABLET | Freq: Every day | ORAL | 0 refills | Status: DC
  Filled 2024-02-10: qty 30, 30d supply, fill #0

## 2024-02-10 MED ORDER — ROSUVASTATIN CALCIUM 20 MG PO TABS
20.0000 mg | ORAL_TABLET | Freq: Every day | ORAL | 0 refills | Status: DC
  Filled 2024-02-10: qty 30, 30d supply, fill #0

## 2024-02-10 MED ORDER — METFORMIN HCL 500 MG PO TABS
500.0000 mg | ORAL_TABLET | Freq: Two times a day (BID) | ORAL | 0 refills | Status: DC
  Filled 2024-02-10: qty 60, 30d supply, fill #0

## 2024-02-10 NOTE — Progress Notes (Signed)
 Office: [phone]  /  Fax: [phone]  WEIGHT SUMMARY AND BIOMETRICS  Anthropometric Measurements Height: 5' 4 (1.626 m) Weight: 229 lb (103.9 kg) BMI (Calculated): 39.29 Weight at Last Visit: 222 lb Weight Lost Since Last Visit: 0 Weight Gained Since Last Visit: 7 lb Starting Weight: 275 lb Total Weight Loss (lbs): 46 lb (20.9 kg) Peak Weight: 287 lb   Body Composition  Body Fat %: 47.4 % Fat Mass (lbs): 108.6 lbs Muscle Mass (lbs): 114.6 lbs Total Body Water (lbs): 88.6 lbs Visceral Fat Rating : 14   Other Clinical Data Fasting: no Labs: no Today's Visit #: 19 Starting Date: 08/06/22    Chief Complaint: OBESITY   History of Present Illness [name] is a 53 year old female with obesity, type two diabetes, and hyperlipidemia who presents for obesity treatment and progress assessment.  She is adhering to the prescribed category four eating plan approximately sixty percent of the time and engages in walking for exercise for twenty minutes, three to four times a week. Despite these efforts, she has gained seven pounds in the last month. She has noticed weight gain and has been drinking more water, but is unsure of the cause. She reports no puffiness in her hands or ankles, but notes changes in her clothing fit and describes a physically difficult month with significant pain.  Her type two diabetes is managed with Mounjaro  12.5 mg, metformin  500 mg twice a day, and Farxiga  10 mg. She requests refills for these medications. Her most recent hemoglobin A1c was well controlled at 5.3. She has been consistent with her medication regimen and has not missed doses of Farxiga , which is known to affect fluid balance.  Her hyperlipidemia is being managed primarily through diet, exercise, and weight loss, supplemented by Crestor  20 mg, for which she also requests a refill.  She has a history of vitamin D  deficiency and is on prescription vitamin D . Her most recent vitamin  D level was 39.8, which is lower than the goal of 50 to 60. She reports no issues with her vitamin D  supplementation.  She describes a difficult month physically, with significant pain due to a pinched nerve in her neck and an upcoming root canal, leading to increased use of pain medication. She acknowledges that pain often leads her to comfort eating, which may have contributed to her weight gain.      PHYSICAL EXAM:  Blood pressure 124/79, pulse 73, temperature 97.9 F (36.6 C), height 5' 4 (1.626 m), weight 229 lb (103.9 kg), SpO2 96%. Body mass index is 39.31 kg/m.  DIAGNOSTIC DATA REVIEWED:  BMET    Component Value Date/Time   NA 139 12/09/2023 0753   K 4.1 12/09/2023 0753   CL 103 12/09/2023 0753   CO2 21 12/09/2023 0753   GLUCOSE 77 12/09/2023 0753   GLUCOSE 176 (H) 07/20/2022 1334   BUN 22 12/09/2023 0753   CREATININE 0.61 12/09/2023 0753   CREATININE 0.60 05/26/2019 1616   CALCIUM  9.0 12/09/2023 0753   GFRNONAA >60 08/29/2019 1150   GFRNONAA 108 05/26/2019 1616   GFRAA >60 08/29/2019 1150   GFRAA 125 05/26/2019 1616   Lab Results  Component Value Date   HGBA1C 5.3 12/09/2023   HGBA1C 5.6 07/24/2015   Lab Results  Component Value Date   INSULIN  28.2 (H) 12/09/2023   INSULIN  50.6 (H) 08/06/2022   Lab Results  Component Value Date   TSH 3.670 01/04/2024   CBC    Component Value Date/Time  WBC 7.6 05/13/2023 1038   WBC 7.4 01/26/2022 0914   RBC 4.42 05/13/2023 1038   RBC 4.38 01/26/2022 0914   HGB 14.1 05/13/2023 1038   HCT 41.5 05/13/2023 1038   PLT 296 05/13/2023 1038   MCV 94 05/13/2023 1038   MCH 31.9 05/13/2023 1038   MCH 31.1 08/29/2019 1150   MCHC 34.0 05/13/2023 1038   MCHC 33.9 01/26/2022 0914   RDW 12.6 05/13/2023 1038   Iron Studies    Component Value Date/Time   FERRITIN 51.1 06/15/2016 1218   Lipid Panel     Component Value Date/Time   CHOL 128 01/04/2024 0918   TRIG 120 01/04/2024 0918   HDL 44 01/04/2024 0918   CHOLHDL 3  01/26/2022 0914   VLDL 32.8 01/26/2022 0914   LDLCALC 62 01/04/2024 0918   LDLDIRECT 118.0 01/08/2021 1438   Hepatic Function Panel     Component Value Date/Time   PROT 7.0 12/09/2023 0753   ALBUMIN 4.3 12/09/2023 0753   AST 16 12/09/2023 0753   ALT 22 12/09/2023 0753   ALKPHOS 88 12/09/2023 0753   BILITOT 0.5 12/09/2023 0753   BILIDIR 0.10 02/06/2020 0947      Component Value Date/Time   TSH 3.670 01/04/2024 0918   Nutritional Lab Results  Component Value Date   VD25OH 39.8 12/09/2023   VD25OH 54.3 05/13/2023   VD25OH 60.6 12/10/2022     Assessment and Plan Assessment & Plan Obesity She follows the category four eating plan approximately 60% of the time and exercises by walking 20 minutes 3-4 times a week. She has gained 7 pounds in the last month, with 4 pounds attributed to water weight and 3 pounds to fat, likely due to increased pain and stress leading to comfort eating. Discussed the impact of sodium and simple carbohydrates on water retention and emphasized hydration. A new short-term eating plan was introduced to reset cravings and reduce fluid retention. This plan is not nutritionally complete for long-term use but is suitable for 2-4 weeks to jumpstart weight loss and reset cravings. Deviating from the plan can cause an insulin  response lasting up to three days, hindering fat loss. - Implement a new short-term low carbohydrate eating plan with a list of unlimited and limited foods to reset cravings and reduce fluid retention. - Encourage continued hydration, especially while on the new eating plan. - Refill medications for obesity management.  Type 2 Diabetes Mellitus Type 2 diabetes is well-controlled with a recent hemoglobin A1c of 5.3. She is on Mounjaro  12.5 mg, metformin  500 mg twice a day, and Farxiga  10 mg. Emphasized the importance of regular medication adherence, especially with Farxiga , which can affect fluid balance. - Refill Mounjaro , metformin , and  Farxiga  prescriptions. - Continue current diabetes management plan with emphasis on medication adherence and diet, exercise and weight loss.  Hyperlipidemia Hyperlipidemia is managed primarily through diet, exercise, and weight loss, supplemented by Crestor  20 mg. She requests a refill of Crestor . - Refill Crestor  prescription. - Continue current management plan focusing on diet and exercise.  Vitamin D  Deficiency Vitamin D  level is 39.8, below the target range of 50-60. She is on prescription vitamin D . - Continue prescription vitamin D  supplementation.  Follow-up Scheduled for a follow-up appointment on August 5th at 8 AM to assess progress and possibly conduct lab tests if adherence to the plan is reported. - Plan for fasting labs at the next visit if adherence to the plan is reported.      She was informed  of the importance of frequent follow up visits to maximize her success with intensive lifestyle modifications for her multiple health conditions.    Louann Penton, MD

## 2024-02-22 ENCOUNTER — Ambulatory Visit
Admission: RE | Admit: 2024-02-22 | Discharge: 2024-02-22 | Disposition: A | Discharge status: Home / Self Care | Source: Ambulatory Visit | Attending: Family Medicine | Admitting: Family Medicine

## 2024-02-22 DIAGNOSIS — Z1231 Encounter for screening mammogram for malignant neoplasm of breast: Secondary | ICD-10-CM

## 2024-03-06 IMAGING — MG MM DIGITAL SCREENING BILAT W/ TOMO AND CAD
8 series · 8 of 24 positions shown · non-contrast
Comparison: Previous exam(s).

CLINICAL DATA: Screening.

EXAM:
DIGITAL SCREENING BILATERAL MAMMOGRAM WITH TOMOSYNTHESIS AND CAD
TECHNIQUE: Bilateral screening digital craniocaudal and mediolateral oblique
mammograms were obtained. Bilateral screening digital breast
tomosynthesis was performed. The images were evaluated with
computer-aided detection.

[L CC synth-2D]
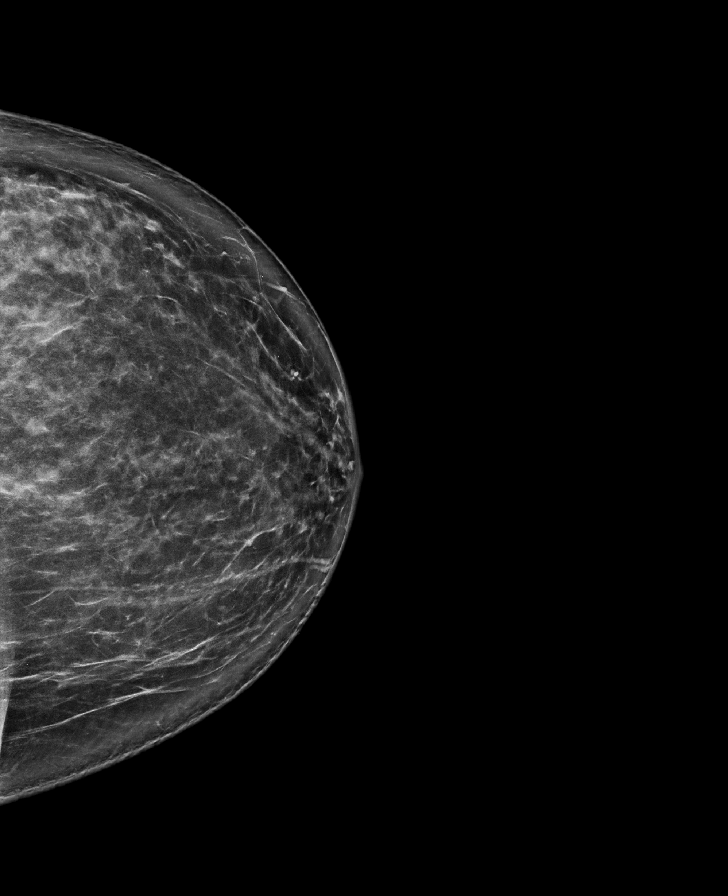

[R CC synth-2D]
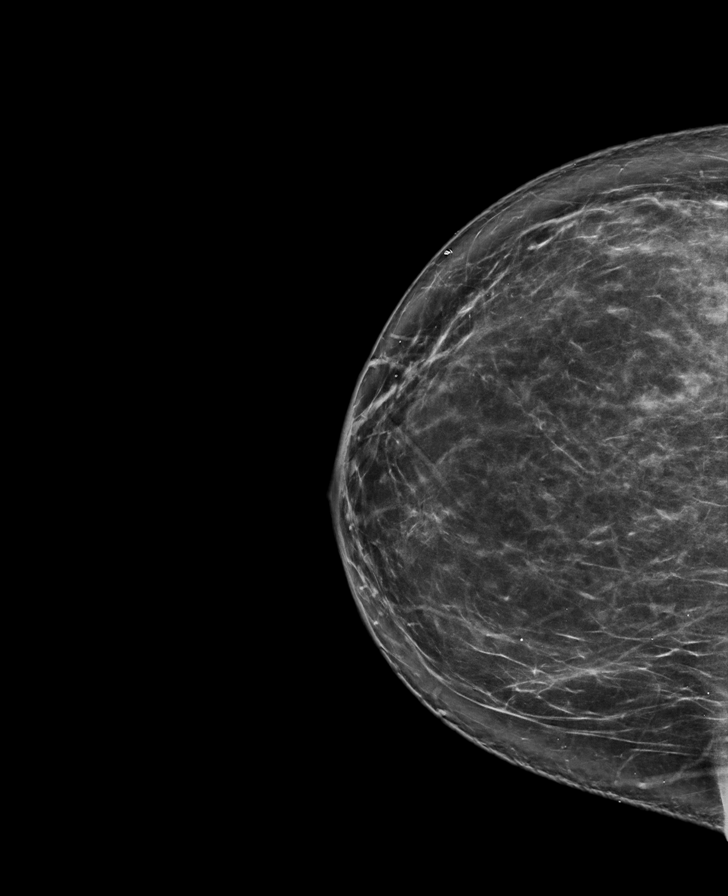

[R MLO synth-2D]
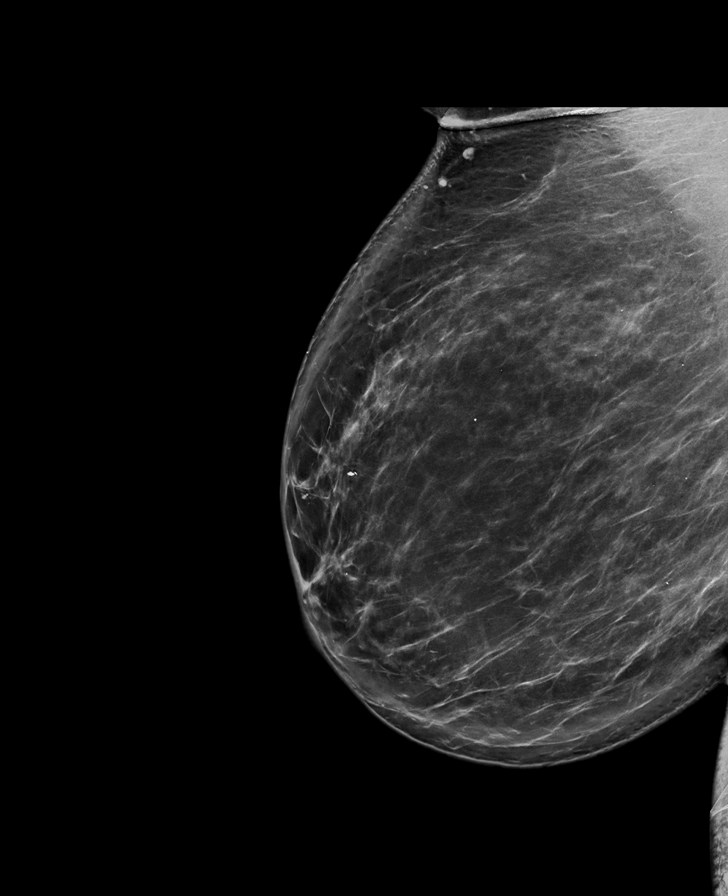

[L MLO synth-2D]
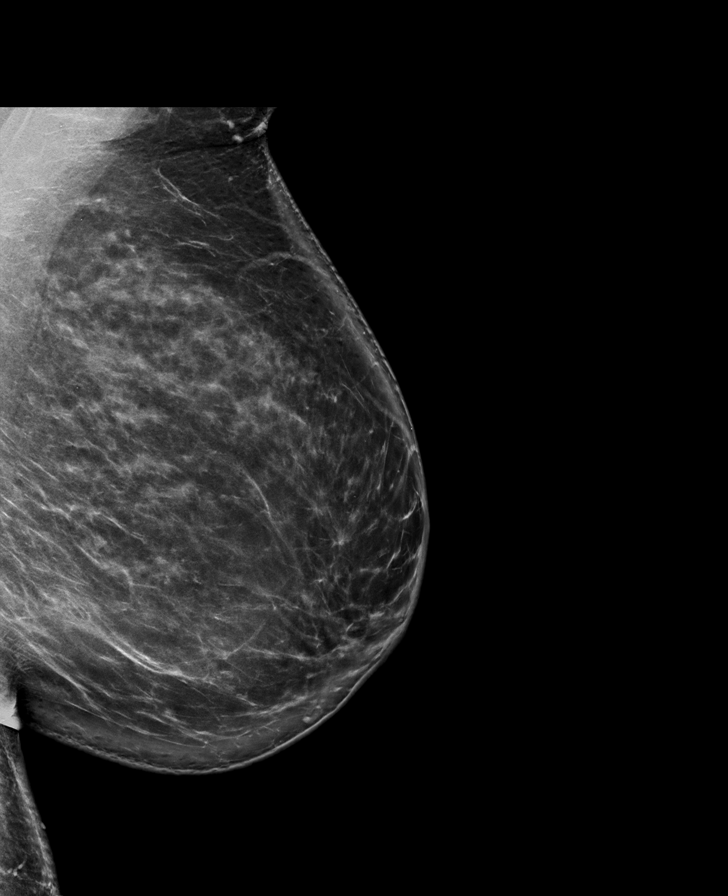

[R MLO tomo · tomo slice 54/107.0]
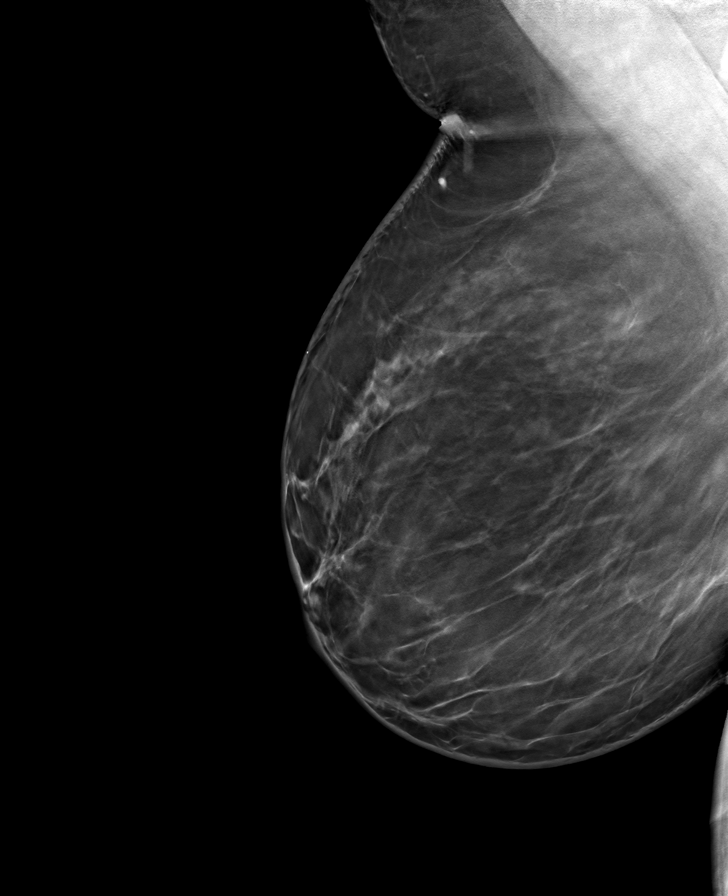

[L MLO tomo · tomo slice 54/107.0]
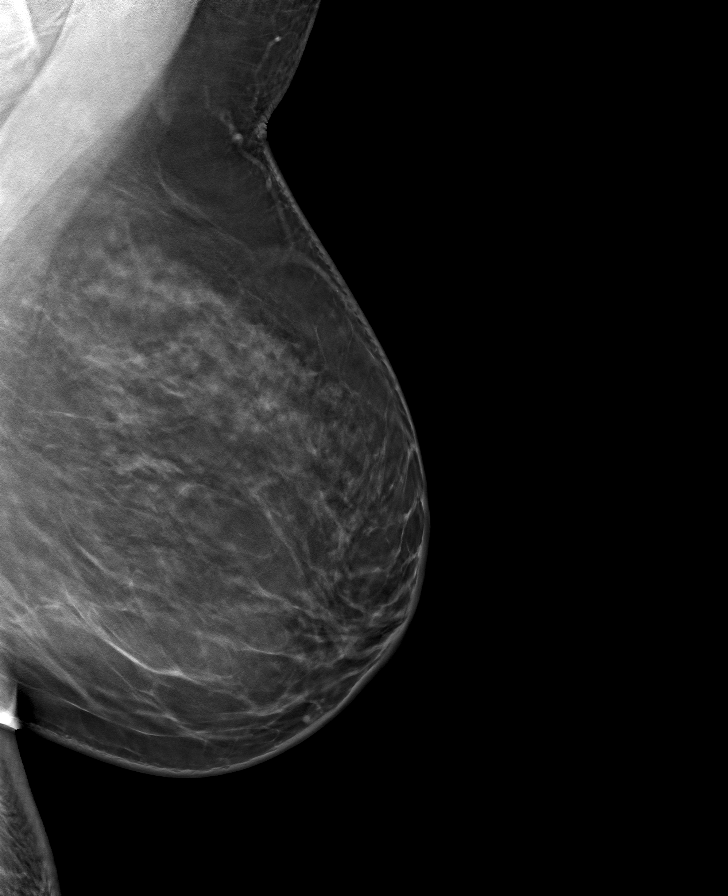

[R CC tomo · tomo slice 48/95.0]
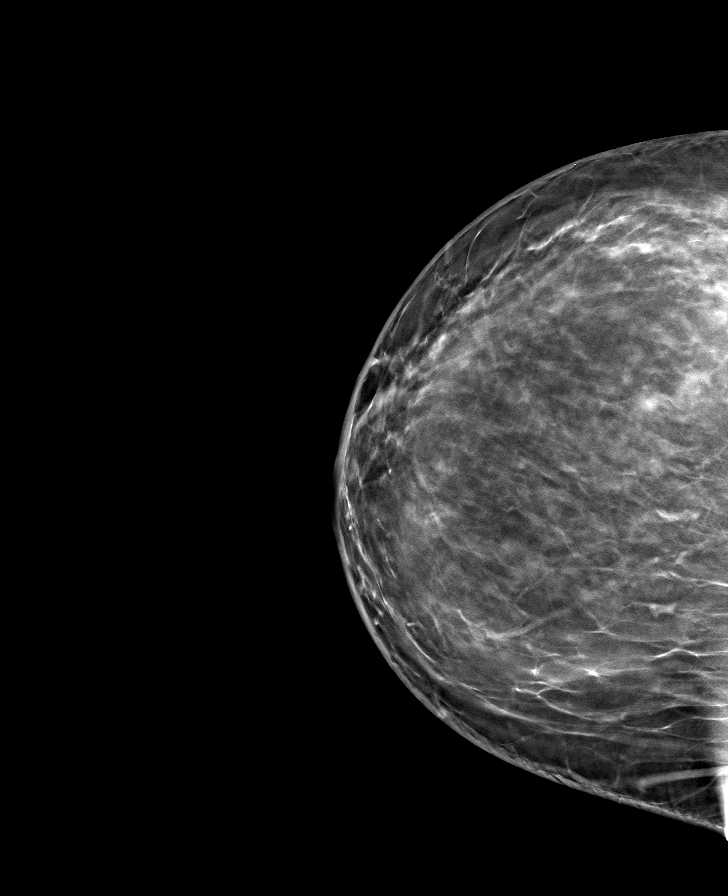

[L CC tomo · tomo slice 47/94.0]
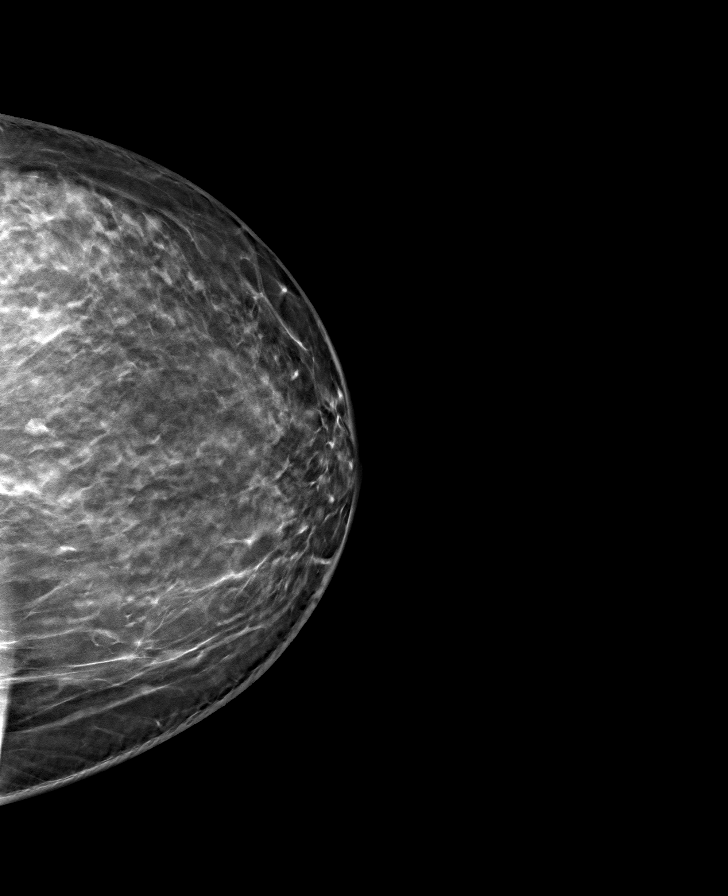

[8 of 24 positions shown; findings below may reference images not displayed]

ACR Breast Density Category b: There are scattered areas of
fibroglandular density.
FINDINGS: There are no findings suspicious for malignancy.
IMPRESSION: No mammographic evidence of malignancy. A result letter of this
screening mammogram will be mailed directly to the patient.

RECOMMENDATION:
Screening mammogram in one year. (Code:51-O-LD2)

BI-RADS CATEGORY  1: Negative.

## 2024-03-07 ENCOUNTER — Other Ambulatory Visit (HOSPITAL_COMMUNITY): Payer: Self-pay

## 2024-03-07 ENCOUNTER — Encounter (INDEPENDENT_AMBULATORY_CARE_PROVIDER_SITE_OTHER): Payer: Self-pay | Admitting: Family Medicine

## 2024-03-07 ENCOUNTER — Ambulatory Visit (INDEPENDENT_AMBULATORY_CARE_PROVIDER_SITE_OTHER): Admitting: Family Medicine

## 2024-03-07 VITALS — BP 121/75 | HR 71 | Temp 98.3°F | Ht 64.0 in | Wt 226.0 lb

## 2024-03-07 DIAGNOSIS — E118 Type 2 diabetes mellitus with unspecified complications: Secondary | ICD-10-CM

## 2024-03-07 DIAGNOSIS — E559 Vitamin D deficiency, unspecified: Secondary | ICD-10-CM | POA: Diagnosis not present

## 2024-03-07 DIAGNOSIS — E119 Type 2 diabetes mellitus without complications: Secondary | ICD-10-CM | POA: Diagnosis not present

## 2024-03-07 DIAGNOSIS — Z7984 Long term (current) use of oral hypoglycemic drugs: Secondary | ICD-10-CM

## 2024-03-07 DIAGNOSIS — E785 Hyperlipidemia, unspecified: Secondary | ICD-10-CM

## 2024-03-07 DIAGNOSIS — E669 Obesity, unspecified: Secondary | ICD-10-CM

## 2024-03-07 DIAGNOSIS — Z7985 Long-term (current) use of injectable non-insulin antidiabetic drugs: Secondary | ICD-10-CM

## 2024-03-07 DIAGNOSIS — E782 Mixed hyperlipidemia: Secondary | ICD-10-CM

## 2024-03-07 DIAGNOSIS — Z6838 Body mass index (BMI) 38.0-38.9, adult: Secondary | ICD-10-CM

## 2024-03-07 DIAGNOSIS — Z6841 Body Mass Index (BMI) 40.0 and over, adult: Secondary | ICD-10-CM

## 2024-03-07 MED ORDER — ROSUVASTATIN CALCIUM 20 MG PO TABS
20.0000 mg | ORAL_TABLET | Freq: Every day | ORAL | 0 refills | Status: DC
  Filled 2024-03-07: qty 30, 30d supply, fill #0

## 2024-03-07 MED ORDER — TIRZEPATIDE 12.5 MG/0.5ML ~~LOC~~ SOAJ
12.5000 mg | SUBCUTANEOUS | 0 refills | Status: DC
  Filled 2024-03-07: qty 2, 28d supply, fill #0

## 2024-03-07 NOTE — Progress Notes (Signed)
 Office: [phone]  /  Fax: [phone]  WEIGHT SUMMARY AND BIOMETRICS  Anthropometric Measurements Height: 5' 4 (1.626 m) Weight: 226 lb (102.5 kg) BMI (Calculated): 38.77 Weight at Last Visit: 229lb Weight Lost Since Last Visit: 3lb Weight Gained Since Last Visit: 0lb Starting Weight: 275lb Total Weight Loss (lbs): 49 lb (22.2 kg) Peak Weight: 287lb   Body Composition  Body Fat %: 46.2 % Fat Mass (lbs): 104.4 lbs Muscle Mass (lbs): 115.4 lbs Total Body Water (lbs): 86.6 lbs Visceral Fat Rating : 14   Other Clinical Data Fasting: yes Labs: No Today's Visit #: 20 Starting Date: 08/06/22    Chief Complaint: OBESITY   Discussed the use of AI scribe software for clinical note transcription with the patient, who gave verbal consent to proceed.  History of Present Illness [name] is a 53 year old female with obesity and type two diabetes who presents for a follow-up on her obesity treatment plan and diabetes management.  She is adhering to a category four eating plan about seventy percent of the time, focusing on meeting protein goals and avoiding skipped meals. She exercises for thirty minutes four times a week and walks ten thousand steps daily, resulting in a three-pound weight loss over the past month.  She is currently taking metformin  and Mounjaro  for type two diabetes. She requests a refill for Mounjaro  and reports difficulty remembering her morning dose of metformin  but consistently takes her evening dose. She is also on Crestor  20 mg for hyperlipidemia and requests a refill.  She indulged in ice cream and pasta during a recent vacation, which are not part of her usual diet. Maintaining a low-carb diet is challenging, especially during family gatherings and vacations.  She reports sleeping seven to nine hours per night and is more conscious of her dietary choices, opting for lower-calorie alternatives over high-calorie desserts.  She was previously  prescribed vitamin D  but did not take it last month due to uncertainty about the need for continuous supplementation. Her last vitamin D  level in May was 39.      PHYSICAL EXAM:  Blood pressure 121/75, pulse 71, temperature 98.3 F (36.8 C), height 5' 4 (1.626 m), weight 226 lb (102.5 kg), SpO2 98%. Body mass index is 38.79 kg/m.  DIAGNOSTIC DATA REVIEWED:  BMET    Component Value Date/Time   NA 139 12/09/2023 0753   K 4.1 12/09/2023 0753   CL 103 12/09/2023 0753   CO2 21 12/09/2023 0753   GLUCOSE 77 12/09/2023 0753   GLUCOSE 176 (H) 07/20/2022 1334   BUN 22 12/09/2023 0753   CREATININE 0.61 12/09/2023 0753   CREATININE 0.60 05/26/2019 1616   CALCIUM  9.0 12/09/2023 0753   GFRNONAA >60 08/29/2019 1150   GFRNONAA 108 05/26/2019 1616   GFRAA >60 08/29/2019 1150   GFRAA 125 05/26/2019 1616   Lab Results  Component Value Date   HGBA1C 5.3 12/09/2023   HGBA1C 5.6 07/24/2015   Lab Results  Component Value Date   INSULIN  28.2 (H) 12/09/2023   INSULIN  50.6 (H) 08/06/2022   Lab Results  Component Value Date   TSH 3.670 01/04/2024   CBC    Component Value Date/Time   WBC 7.6 05/13/2023 1038   WBC 7.4 01/26/2022 0914   RBC 4.42 05/13/2023 1038   RBC 4.38 01/26/2022 0914   HGB 14.1 05/13/2023 1038   HCT 41.5 05/13/2023 1038   PLT 296 05/13/2023 1038   MCV 94 05/13/2023 1038   MCH 31.9 05/13/2023 1038  MCH 31.1 08/29/2019 1150   MCHC 34.0 05/13/2023 1038   MCHC 33.9 01/26/2022 0914   RDW 12.6 05/13/2023 1038   Iron Studies    Component Value Date/Time   FERRITIN 51.1 06/15/2016 1218   Lipid Panel     Component Value Date/Time   CHOL 128 01/04/2024 0918   TRIG 120 01/04/2024 0918   HDL 44 01/04/2024 0918   CHOLHDL 3 01/26/2022 0914   VLDL 32.8 01/26/2022 0914   LDLCALC 62 01/04/2024 0918   LDLDIRECT 118.0 01/08/2021 1438   Hepatic Function Panel     Component Value Date/Time   PROT 7.0 12/09/2023 0753   ALBUMIN 4.3 12/09/2023 0753   AST 16  12/09/2023 0753   ALT 22 12/09/2023 0753   ALKPHOS 88 12/09/2023 0753   BILITOT 0.5 12/09/2023 0753   BILIDIR 0.10 02/06/2020 0947      Component Value Date/Time   TSH 3.670 01/04/2024 0918   Nutritional Lab Results  Component Value Date   VD25OH 39.8 12/09/2023   VD25OH 54.3 05/13/2023   VD25OH 60.6 12/10/2022     Assessment and Plan Assessment & Plan Obesity Obesity management with a category four eating plan, achieving 70% adherence. Weight loss of 3 pounds in the last month, attributed entirely to fat loss. Challenges with low-carb diet adherence, especially during vacation. Emphasis on the importance of protein intake and managing insulin  response to optimize fat loss. - Continue category four eating plan with focus on protein intake - Encourage adherence to low-carb diet for extended periods, at least 1 week or more at a time - Discuss strategies for managing cravings and dietary choices during vacations  Type 2 diabetes mellitus Type 2 diabetes managed with metformin  and Mounjaro . Difficulty remembering morning dose of metformin . Blood pressure well-controlled at 121/75 mmHg. - Refill Mounjaro  prescription - Continue metformin , consider placing medications by coffee to aid in remembering doses - Continue diet, exercise and weight loss as discussed today as an important part of her treatment plan  Hyperlipidemia Hyperlipidemia associated with diabetes managed with Crestor  20 mg. No reported issues with Crestor . - Refill Crestor  prescription - Continue diet, exercise and weight loss as discussed today as an important part of her treatment plan  Vitamin D  deficiency Vitamin D  deficiency with previous level of 39 ng/mL in May. Explained that as weight decreases, vitamin D  requirements may change due to its fat solubility and storage in fat cells. - Continue vitamin D  supplementation - Check vitamin D  levels next 1-2 months    She was informed of the importance of  frequent follow up visits to maximize her success with intensive lifestyle modifications for her multiple health conditions.    Louann Penton, MD

## 2024-03-07 NOTE — Addendum Note (Signed)
 Addended by: VERDON PARRY D on: 03/07/2024 08:35 AM   Modules accepted: Orders

## 2024-03-10 ENCOUNTER — Ambulatory Visit: Payer: Self-pay

## 2024-03-10 ENCOUNTER — Other Ambulatory Visit (HOSPITAL_COMMUNITY): Payer: Self-pay
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
## 2024-03-13 ENCOUNTER — Encounter: Payer: Self-pay | Admitting: Family Medicine

## 2024-03-13 ENCOUNTER — Ambulatory Visit: Admitting: Family Medicine

## 2024-03-13 VITALS — BP 126/78 | HR 80 | Temp 98.0°F | Resp 16 | Ht 64.0 in | Wt 231.4 lb

## 2024-03-13 DIAGNOSIS — S46811A Strain of other muscles, fascia and tendons at shoulder and upper arm level, right arm, initial encounter: Secondary | ICD-10-CM

## 2024-03-13 NOTE — Telephone Encounter (Signed)
 Pt has appt today
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
## 2024-03-13 NOTE — Patient Instructions (Signed)
 Heat (pad or rice pillow in microwave) over affected area, 10-15 minutes twice daily.   Ice/cold pack over area for 10-15 min twice daily.  If you do not hear anything about your referral in the next few days, call our office and ask for an update.  OK to take Tylenol  1000 mg (2 extra strength tabs) or 975 mg (3 regular strength tabs) every 6 hours as needed.  Ibuprofen 400-600 mg (2-3 over the counter strength tabs) every 6 hours as needed for pain.  Let us  know if you need anything.  Trapezius stretches/exercises Do exercises exactly as told by your health care provider and adjust them as directed. It is normal to feel mild stretching, pulling, tightness, or discomfort as you do these exercises, but you should stop right away if you feel sudden pain or your pain gets worse.   Stretching and range of motion exercises These exercises warm up your muscles and joints and improve the movement and flexibility of your shoulder. These exercises can also help to relieve pain, numbness, and tingling. If you are unable to do any of the following for any reason, do not further attempt to do it.   Exercise A: Flexion, standing     Stand and hold a broomstick, a cane, or a similar object. Place your hands a little more than shoulder-width apart on the object. Your left / right hand should be palm-up, and your other hand should be palm-down. Push the stick to raise your left / right arm out to your side and then over your head. Use your other hand to help move the stick. Stop when you feel a stretch in your shoulder, or when you reach the angle that is recommended by your health care provider. Avoid shrugging your shoulder while you raise your arm. Keep your shoulder blade tucked down toward your spine. Hold for 30 seconds. Slowly return to the starting position. Repeat 2 times. Complete this exercise 3 times per week.  Exercise B: Abduction, supine     Lie on your back and hold a broomstick, a  cane, or a similar object. Place your hands a little more than shoulder-width apart on the object. Your left / right hand should be palm-up, and your other hand should be palm-down. Push the stick to raise your left / right arm out to your side and then over your head. Use your other hand to help move the stick. Stop when you feel a stretch in your shoulder, or when you reach the angle that is recommended by your health care provider. Avoid shrugging your shoulder while you raise your arm. Keep your shoulder blade tucked down toward your spine. Hold for 30 seconds. Slowly return to the starting position. Repeat 2 times. Complete this exercise 3 times per week.  Exercise C: Flexion, active-assisted     Lie on your back. You may bend your knees for comfort. Hold a broomstick, a cane, or a similar object. Place your hands about shoulder-width apart on the object. Your palms should face toward your feet. Raise the stick and move your arms over your head and behind your head, toward the floor. Use your healthy arm to help your left / right arm move farther. Stop when you feel a gentle stretch in your shoulder, or when you reach the angle where your health care provider tells you to stop. Hold for 30 seconds. Slowly return to the starting position. Repeat 2 times. Complete this exercise 3 times per week.  Exercise D: External rotation and abduction     Stand in a door frame with one of your feet slightly in front of the other. This is called a staggered stance. Choose one of the following positions as told by your health care provider: Place your hands and forearms on the door frame above your head. Place your hands and forearms on the door frame at the height of your head. Place your hands on the door frame at the height of your elbows. Slowly move your weight onto your front foot until you feel a stretch across your chest and in the front of your shoulders. Keep your head and chest upright and  keep your abdominal muscles tight. Hold for 30 seconds. To release the stretch, shift your weight to your back foot. Repeat 2 times. Complete this stretch 3 times per week.  Strengthening exercises These exercises build strength and endurance in your shoulder. Endurance is the ability to use your muscles for a long time, even after your muscles get tired. Exercise E: Scapular depression and adduction  Sit on a stable chair. Support your arms in front of you with pillows, armrests, or a tabletop. Keep your elbows in line with the sides of your body. Gently move your shoulder blades down toward your middle back. Relax the muscles on the tops of your shoulders and in the back of your neck. Hold for 3 seconds. Slowly release the tension and relax your muscles completely before doing this exercise again. Repeat for a total of 10 repetitions. After you have practiced this exercise, try doing the exercise without the arm support. Then, try the exercise while standing instead of sitting. Repeat 2 times. Complete this exercise 3 times per week.  Exercise F: Shoulder abduction, isometric     Stand or sit about 4-6 inches (10-15 cm) from a wall with your left / right side facing the wall. Bend your left / right elbow and gently press your elbow against the wall. Increase the pressure slowly until you are pressing as hard as you can without shrugging your shoulder. Hold for 3 seconds. Slowly release the tension and relax your muscles completely. Repeat for a total of 10 repetitions. Repeat 2 times. Complete this exercise 3 times per week.  Exercise G: Shoulder flexion, isometric     Stand or sit about 4-6 inches (10-15 cm) away from a wall with your left / right side facing the wall. Keep your left / right elbow straight and gently press the top of your fist against the wall. Increase the pressure slowly until you are pressing as hard as you can without shrugging your shoulder. Hold for 10-15  seconds. Slowly release the tension and relax your muscles completely. Repeat for a total of 10 repetitions. Repeat 2 times. Complete this exercise 3 times per week.  Exercise H: Internal rotation     Sit in a stable chair without armrests, or stand. Secure an exercise band at your left / right side, at elbow height. Place a soft object, such as a folded towel or a small pillow, under your left / right upper arm so your elbow is a few inches (about 8 cm) away from your side. Hold the end of the exercise band so the band stretches. Keeping your elbow pressed against the soft object under your arm, move your forearm across your body toward your abdomen. Keep your body steady so the movement is only coming from your shoulder. Hold for 3 seconds. Slowly return to  the starting position. Repeat for a total of 10 repetitions. Repeat 2 times. Complete this exercise 3 times per week.  Exercise I: External rotation     Sit in a stable chair without armrests, or stand. Secure an exercise band at your left / right side, at elbow height. Place a soft object, such as a folded towel or a small pillow, under your left / right upper arm so your elbow is a few inches (about 8 cm) away from your side. Hold the end of the exercise band so the band stretches. Keeping your elbow pressed against the soft object under your arm, move your forearm out, away from your abdomen. Keep your body steady so the movement is only coming from your shoulder. Hold for 3 seconds. Slowly return to the starting position. Repeat for a total of 10 repetitions. Repeat 2 times. Complete this exercise 3 times per week. Exercise J: Shoulder extension  Sit in a stable chair without armrests, or stand. Secure an exercise band to a stable object in front of you so the band is at shoulder height. Hold one end of the exercise band in each hand. Your palms should face each other. Straighten your elbows and lift your hands up to shoulder  height. Step back, away from the secured end of the exercise band, until the band stretches. Squeeze your shoulder blades together and pull your hands down to the sides of your thighs. Stop when your hands are straight down by your sides. Do not let your hands go behind your body. Hold for 3 seconds. Slowly return to the starting position. Repeat for a total of 10 repetitions. Repeat 2 times. Complete this exercise 3 times per week.  Exercise K: Shoulder extension, prone     Lie on your abdomen on a firm surface so your left / right arm hangs over the edge. Hold a 5 lb weight in your hand so your palm faces in toward your body. Your arm should be straight. Squeeze your shoulder blade down toward the middle of your back. Slowly raise your arm behind you, up to the height of the surface that you are lying on. Keep your arm straight. Hold for 3 seconds. Slowly return to the starting position and relax your muscles. Repeat for a total of 10 repetitions. Repeat 2 times. Complete this exercise 3 times per week.   Exercise L: Horizontal abduction, prone  Lie on your abdomen on a firm surface so your left / right arm hangs over the edge. Hold a 5 lb weight in your hand so your palm faces toward your feet. Your arm should be straight. Squeeze your shoulder blade down toward the middle of your back. Bend your elbow so your hand moves up, until your elbow is bent to an L shape (90 degrees). With your elbow bent, slowly move your forearm forward and up. Raise your hand up to the height of the surface that you are lying on. Your upper arm should not move, and your elbow should stay bent. At the top of the movement, your palm should face the floor. Hold for 3 seconds. Slowly return to the starting position and relax your muscles. Repeat for a total of 10 repetitions. Repeat 2 times. Complete this exercise 3 times per week.  Exercise M: Horizontal abduction, standing  Sit on a stable chair, or  stand. Secure an exercise band to a stable object in front of you so the band is at shoulder height. Hold one end  of the exercise band in each hand. Straighten your elbows and lift your hands straight in front of you, up to shoulder height. Your palms should face down, toward the floor. Step back, away from the secured end of the exercise band, until the band stretches. Move your arms out to your sides, and keep your arms straight. Hold for 3 seconds. Slowly return to the starting position. Repeat for a total of 10 repetitions. Repeat 2 times. Complete this exercise 3 times per week.  Exercise N: Scapular retraction and elevation  Sit on a stable chair, or stand. Secure an exercise band to a stable object in front of you so the band is at shoulder height. Hold one end of the exercise band in each hand. Your palms should face each other. Sit in a stable chair without armrests, or stand. Step back, away from the secured end of the exercise band, until the band stretches. Squeeze your shoulder blades together and lift your hands over your head. Keep your elbows straight. Hold for 3 seconds. Slowly return to the starting position. Repeat for a total of 10 repetitions. Repeat 2 times. Complete this exercise 3 times per week.  This information is not intended to replace advice given to you by your health care provider. Make sure you discuss any questions you have with your health care provider. Document Released: 07/20/2005 Document Revised: 03/26/2016 Document Reviewed: 06/06/2015 [employer] Interactive Patient Education  2017 [employer].

## 2024-03-22 ENCOUNTER — Ambulatory Visit (INDEPENDENT_AMBULATORY_CARE_PROVIDER_SITE_OTHER): Admitting: Neurology

## 2024-03-22 ENCOUNTER — Encounter: Payer: Self-pay | Admitting: Neurology

## 2024-03-22 VITALS — BP 141/86 | HR 79 | Ht 64.0 in | Wt 230.0 lb

## 2024-03-22 DIAGNOSIS — G43409 Hemiplegic migraine, not intractable, without status migrainosus: Secondary | ICD-10-CM

## 2024-03-22 DIAGNOSIS — Z6839 Body mass index (BMI) 39.0-39.9, adult: Secondary | ICD-10-CM

## 2024-03-22 DIAGNOSIS — G4733 Obstructive sleep apnea (adult) (pediatric): Secondary | ICD-10-CM | POA: Diagnosis not present

## 2024-03-22 NOTE — Patient Instructions (Signed)
 1) BMI 39. 5 , neck size 17. 75  Mallampati 3 , wore braces .      2) break through snoring while on CPAP  with poor fitting nasal pillow headgear    Plan :  HST without CPAP , continue melatonin.  PS : I gave the patient a [employer] Brevida nasal pillow in standard size with headgear to take home today.      I would like to follow-up with the patient was in the third 2 months after the home sleep test if she still has sleep apnea which is likely we would want to see her was in day 62-day 90 of therapy on the new machine.     I plan to follow up either personally or through our NP within 4 months.
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
## 2024-03-22 NOTE — Progress Notes (Signed)
 SABRA

## 2024-03-23 ENCOUNTER — Other Ambulatory Visit: Payer: Self-pay

## 2024-03-23 ENCOUNTER — Other Ambulatory Visit (HOSPITAL_COMMUNITY): Payer: Self-pay

## 2024-03-23 MED ORDER — METRONIDAZOLE 0.75 % EX CREA
1.0000 | TOPICAL_CREAM | Freq: Two times a day (BID) | CUTANEOUS | 2 refills | Status: AC
  Filled 2024-03-23: qty 135, 90d supply, fill #0

## 2024-03-24 ENCOUNTER — Ambulatory Visit: Attending: Family Medicine

## 2024-03-24 ENCOUNTER — Other Ambulatory Visit: Payer: Self-pay

## 2024-03-24 ENCOUNTER — Other Ambulatory Visit (HOSPITAL_COMMUNITY): Payer: Self-pay

## 2024-03-24 DIAGNOSIS — M25511 Pain in right shoulder: Secondary | ICD-10-CM | POA: Diagnosis present

## 2024-03-24 DIAGNOSIS — G8929 Other chronic pain: Secondary | ICD-10-CM | POA: Diagnosis present

## 2024-03-24 DIAGNOSIS — S46811S Strain of other muscles, fascia and tendons at shoulder and upper arm level, right arm, sequela: Secondary | ICD-10-CM | POA: Diagnosis present

## 2024-03-24 DIAGNOSIS — S46811A Strain of other muscles, fascia and tendons at shoulder and upper arm level, right arm, initial encounter: Secondary | ICD-10-CM | POA: Insufficient documentation

## 2024-03-24 DIAGNOSIS — R293 Abnormal posture: Secondary | ICD-10-CM | POA: Insufficient documentation
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
## 2024-03-29 ENCOUNTER — Ambulatory Visit (INDEPENDENT_AMBULATORY_CARE_PROVIDER_SITE_OTHER)

## 2024-03-29 ENCOUNTER — Other Ambulatory Visit (HOSPITAL_COMMUNITY): Payer: Self-pay

## 2024-03-29 ENCOUNTER — Ambulatory Visit (INDEPENDENT_AMBULATORY_CARE_PROVIDER_SITE_OTHER): Admitting: Sports Medicine

## 2024-03-29 VITALS — HR 81 | Ht 64.0 in | Wt 231.0 lb

## 2024-03-29 DIAGNOSIS — G8929 Other chronic pain: Secondary | ICD-10-CM

## 2024-03-29 DIAGNOSIS — M25511 Pain in right shoulder: Secondary | ICD-10-CM | POA: Diagnosis not present

## 2024-03-29 DIAGNOSIS — M7531 Calcific tendinitis of right shoulder: Secondary | ICD-10-CM

## 2024-03-29 DIAGNOSIS — M5030 Other cervical disc degeneration, unspecified cervical region: Secondary | ICD-10-CM

## 2024-03-29 DIAGNOSIS — M5412 Radiculopathy, cervical region: Secondary | ICD-10-CM

## 2024-03-29 DIAGNOSIS — M542 Cervicalgia: Secondary | ICD-10-CM

## 2024-03-29 MED ORDER — MELOXICAM 15 MG PO TABS
15.0000 mg | ORAL_TABLET | Freq: Every day | ORAL | 0 refills | Status: AC
  Filled 2024-03-29: qty 30, 30d supply, fill #0

## 2024-03-29 NOTE — Patient Instructions (Addendum)
-   Start meloxicam  15 mg daily x2 weeks.  If still having pain after 2 weeks, complete 3rd-week of NSAID. May use remaining NSAID as needed once daily for pain control.  Do not to use additional over-the-counter NSAIDs (ibuprofen, naproxen, Advil, Aleve, etc.) while taking prescription NSAIDs.  May use Tylenol  [phone] mg 2 to 3 times a day for breakthrough pain.  MRI cervical right shoulder   Continue HEP   Follow up 5 days after MRI to discuss results

## 2024-04-04 ENCOUNTER — Encounter: Payer: Self-pay | Admitting: Physical Therapy

## 2024-04-04 ENCOUNTER — Ambulatory Visit: Attending: Family Medicine | Admitting: Physical Therapy

## 2024-04-04 DIAGNOSIS — R293 Abnormal posture: Secondary | ICD-10-CM | POA: Insufficient documentation

## 2024-04-04 DIAGNOSIS — M25511 Pain in right shoulder: Secondary | ICD-10-CM | POA: Diagnosis present

## 2024-04-04 DIAGNOSIS — S46811S Strain of other muscles, fascia and tendons at shoulder and upper arm level, right arm, sequela: Secondary | ICD-10-CM | POA: Insufficient documentation

## 2024-04-04 DIAGNOSIS — G8929 Other chronic pain: Secondary | ICD-10-CM | POA: Diagnosis present
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
## 2024-04-05 ENCOUNTER — Ambulatory Visit (INDEPENDENT_AMBULATORY_CARE_PROVIDER_SITE_OTHER): Admitting: Family Medicine

## 2024-04-06 ENCOUNTER — Other Ambulatory Visit (HOSPITAL_COMMUNITY): Payer: Self-pay

## 2024-04-06 ENCOUNTER — Ambulatory Visit (INDEPENDENT_AMBULATORY_CARE_PROVIDER_SITE_OTHER): Admitting: Family Medicine

## 2024-04-06 ENCOUNTER — Encounter (INDEPENDENT_AMBULATORY_CARE_PROVIDER_SITE_OTHER): Payer: Self-pay | Admitting: Family Medicine

## 2024-04-06 VITALS — BP 135/86 | HR 76 | Temp 98.3°F | Ht 64.0 in | Wt 223.0 lb

## 2024-04-06 DIAGNOSIS — Z6838 Body mass index (BMI) 38.0-38.9, adult: Secondary | ICD-10-CM

## 2024-04-06 DIAGNOSIS — E559 Vitamin D deficiency, unspecified: Secondary | ICD-10-CM

## 2024-04-06 DIAGNOSIS — E782 Mixed hyperlipidemia: Secondary | ICD-10-CM | POA: Diagnosis not present

## 2024-04-06 DIAGNOSIS — E669 Obesity, unspecified: Secondary | ICD-10-CM | POA: Diagnosis not present

## 2024-04-06 DIAGNOSIS — E118 Type 2 diabetes mellitus with unspecified complications: Secondary | ICD-10-CM | POA: Diagnosis not present

## 2024-04-06 DIAGNOSIS — Z7984 Long term (current) use of oral hypoglycemic drugs: Secondary | ICD-10-CM

## 2024-04-06 DIAGNOSIS — Z7985 Long-term (current) use of injectable non-insulin antidiabetic drugs: Secondary | ICD-10-CM

## 2024-04-06 DIAGNOSIS — Z6841 Body Mass Index (BMI) 40.0 and over, adult: Secondary | ICD-10-CM

## 2024-04-06 MED ORDER — VITAMIN D (ERGOCALCIFEROL) 1.25 MG (50000 UNIT) PO CAPS
50000.0000 [IU] | ORAL_CAPSULE | ORAL | 0 refills | Status: DC
  Filled 2024-04-06: qty 5, 35d supply, fill #0

## 2024-04-06 MED ORDER — METFORMIN HCL 500 MG PO TABS
500.0000 mg | ORAL_TABLET | Freq: Two times a day (BID) | ORAL | 0 refills | Status: DC
Start: 2024-04-06 — End: 2024-05-04
  Filled 2024-04-06: qty 60, 30d supply, fill #0

## 2024-04-06 MED ORDER — DAPAGLIFLOZIN PROPANEDIOL 10 MG PO TABS
10.0000 mg | ORAL_TABLET | Freq: Every day | ORAL | 0 refills | Status: DC
  Filled 2024-04-06: qty 30, 30d supply, fill #0

## 2024-04-06 MED ORDER — ROSUVASTATIN CALCIUM 20 MG PO TABS
20.0000 mg | ORAL_TABLET | Freq: Every day | ORAL | 0 refills | Status: DC
  Filled 2024-04-06: qty 30, 30d supply, fill #0

## 2024-04-06 MED ORDER — TIRZEPATIDE 12.5 MG/0.5ML ~~LOC~~ SOAJ
12.5000 mg | SUBCUTANEOUS | 0 refills | Status: DC
  Filled 2024-04-06: qty 2, 28d supply, fill #0
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
## 2024-04-06 NOTE — Progress Notes (Signed)
 Office: [phone]  /  Fax: [phone]  WEIGHT SUMMARY AND BIOMETRICS  Anthropometric Measurements Height: 5' 4 (1.626 m) Weight: 223 lb (101.2 kg) BMI (Calculated): 38.26 Weight at Last Visit: 226 lb Weight Lost Since Last Visit: 3 lb Weight Gained Since Last Visit: 0 Starting Weight: 275 lb Total Weight Loss (lbs): 52 lb (23.6 kg) Peak Weight: 287 lb   Body Composition  Body Fat %: 45.4 % Fat Mass (lbs): 101.4 lbs Muscle Mass (lbs): 115.8 lbs Total Body Water (lbs): 84.2 lbs Visceral Fat Rating : 13   Other Clinical Data Fasting: yes Labs: no Today's Visit #: 21 Starting Date: 08/06/22    Chief Complaint: OBESITY    History of Present Illness [name] is a 53 year old female with obesity, type 2 diabetes, hyperlipidemia, and vitamin D  deficiency who presents for obesity treatment and progress assessment.  She is currently on metformin , Mounjaro , and Farxiga  for type 2 diabetes, Crestor  for hyperlipidemia, and ergocalciferol  50,000 IU weekly for vitamin D  deficiency. She requests refills for these medications.  She follows her treatment plan approximately 65% of the time and engages in physical activity by walking for 30 minutes, four days a week. She has lost three pounds in the past month.  She experiences increased 'food noise' and hunger, particularly at the start of the month, which led to a temporary weight gain. She switched to a lower carbohydrate diet mid-month, which helped slightly, although the 'food noise' persisted.  No current issues with nausea, gastrointestinal problems, or constipation. She takes three stool softeners daily, which effectively manage her bowel movements without causing loose stools.      PHYSICAL EXAM:  Blood pressure 135/86, pulse 76, temperature 98.3 F (36.8 C), height 5' 4 (1.626 m), weight 223 lb (101.2 kg), SpO2 97%. Body mass index is 38.28 kg/m.  DIAGNOSTIC DATA REVIEWED:  BMET    Component Value  Date/Time   NA 139 12/09/2023 0753   K 4.1 12/09/2023 0753   CL 103 12/09/2023 0753   CO2 21 12/09/2023 0753   GLUCOSE 77 12/09/2023 0753   GLUCOSE 176 (H) 07/20/2022 1334   BUN 22 12/09/2023 0753   CREATININE 0.61 12/09/2023 0753   CREATININE 0.60 05/26/2019 1616   CALCIUM  9.0 12/09/2023 0753   GFRNONAA >60 08/29/2019 1150   GFRNONAA 108 05/26/2019 1616   GFRAA >60 08/29/2019 1150   GFRAA 125 05/26/2019 1616   Lab Results  Component Value Date   HGBA1C 5.3 12/09/2023   HGBA1C 5.6 07/24/2015   Lab Results  Component Value Date   INSULIN  28.2 (H) 12/09/2023   INSULIN  50.6 (H) 08/06/2022   Lab Results  Component Value Date   TSH 3.670 01/04/2024   CBC    Component Value Date/Time   WBC 7.6 05/13/2023 1038   WBC 7.4 01/26/2022 0914   RBC 4.42 05/13/2023 1038   RBC 4.38 01/26/2022 0914   HGB 14.1 05/13/2023 1038   HCT 41.5 05/13/2023 1038   PLT 296 05/13/2023 1038   MCV 94 05/13/2023 1038   MCH 31.9 05/13/2023 1038   MCH 31.1 08/29/2019 1150   MCHC 34.0 05/13/2023 1038   MCHC 33.9 01/26/2022 0914   RDW 12.6 05/13/2023 1038   Iron Studies    Component Value Date/Time   FERRITIN 51.1 06/15/2016 1218   Lipid Panel     Component Value Date/Time   CHOL 128 01/04/2024 0918   TRIG 120 01/04/2024 0918   HDL 44 01/04/2024 0918   CHOLHDL 3  01/26/2022 0914   VLDL 32.8 01/26/2022 0914   LDLCALC 62 01/04/2024 0918   LDLDIRECT 118.0 01/08/2021 1438   Hepatic Function Panel     Component Value Date/Time   PROT 7.0 12/09/2023 0753   ALBUMIN 4.3 12/09/2023 0753   AST 16 12/09/2023 0753   ALT 22 12/09/2023 0753   ALKPHOS 88 12/09/2023 0753   BILITOT 0.5 12/09/2023 0753   BILIDIR 0.10 02/06/2020 0947      Component Value Date/Time   TSH 3.670 01/04/2024 0918   Nutritional Lab Results  Component Value Date   VD25OH 39.8 12/09/2023   VD25OH 54.3 05/13/2023   VD25OH 60.6 12/10/2022     Assessment and Plan Assessment & Plan Obesity Obesity management is  ongoing with a category two plan. She reports adherence to the plan about 65% of the time and exercises four days a week with 30 minutes of walking. She has lost three pounds in the last month. She experiences increased 'food noise' and has been trying a lower carb diet, which has helped slightly. Low carb diets can increase food noise due to feelings of deprivation. The role of neurotransmitters in hunger and cravings was discussed, emphasizing the importance of stress management and adequate sleep. - Increase Mounjaro  to the next dose if needed to help with food noise - Encourage adherence to the obesity management plan - Continue exercise regimen - Manage stress through exercise, meditation, or other stress-relief activities - Ensure adequate sleep to help manage neurotransmitter levels  Type 2 diabetes mellitus Type 2 diabetes is being managed with metformin  and Mounjaro . GLP-1s and metformin  are effective for managing hunger related to stomach emptying and gut hormones, but may be less effective in diabetics compared to non-diabetics. Managing stress and sleep is important to help with hunger signals originating in the brain. - Continue metformin  - Continue Mounjaro  - Manage stress and ensure adequate sleep to help with hunger signals - Continue diet, exercise and weight loss as discussed today as an important part of the treatment plan   Mixed hyperlipidemia Mixed hyperlipidemia is being managed with Crestor . She is following her treatment plan about 65% of the time. - Continue Crestor  - Refill Crestor  prescription - Continue diet, exercise and weight loss as discussed today as an important part of the treatment plan   Vitamin D  deficiency Vitamin D  deficiency is being managed with ergocalciferol  50,000 IU per week. - Continue ergocalciferol  50,000 IU per week - Refill ergocalciferol  prescription  Follow-Up Follow-up is scheduled for October 2nd at 9 AM. Labs will be checked at the  next visit, as the last labs were done in May. - Check labs at the next visit on October 2nd - Schedule a follow-up appointment in November     [name] was informed of the importance of frequent follow up visits to maximize her success with intensive lifestyle modifications for her obesity and obesity related health conditions as recommended by USPSTF and CMS guidelines   Louann Penton, MD

## 2024-04-07 ENCOUNTER — Other Ambulatory Visit: Payer: Self-pay | Admitting: Family Medicine

## 2024-04-07 ENCOUNTER — Ambulatory Visit

## 2024-04-07 ENCOUNTER — Other Ambulatory Visit (HOSPITAL_COMMUNITY): Payer: Self-pay

## 2024-04-07 DIAGNOSIS — E039 Hypothyroidism, unspecified: Secondary | ICD-10-CM

## 2024-04-07 DIAGNOSIS — S46811S Strain of other muscles, fascia and tendons at shoulder and upper arm level, right arm, sequela: Secondary | ICD-10-CM | POA: Diagnosis not present

## 2024-04-07 DIAGNOSIS — G8929 Other chronic pain: Secondary | ICD-10-CM

## 2024-04-07 DIAGNOSIS — R293 Abnormal posture: Secondary | ICD-10-CM

## 2024-04-07 MED ORDER — THYROID 30 MG PO TABS
90.0000 mg | ORAL_TABLET | Freq: Every day | ORAL | 0 refills | Status: DC
  Filled 2024-04-07: qty 270, 90d supply, fill #0

## 2024-04-08 ENCOUNTER — Other Ambulatory Visit (HOSPITAL_COMMUNITY): Payer: Self-pay

## 2024-04-09 ENCOUNTER — Other Ambulatory Visit

## 2024-04-09 ENCOUNTER — Ambulatory Visit (INDEPENDENT_AMBULATORY_CARE_PROVIDER_SITE_OTHER)

## 2024-04-09 ENCOUNTER — Other Ambulatory Visit (HOSPITAL_COMMUNITY): Payer: Self-pay

## 2024-04-09 DIAGNOSIS — M7531 Calcific tendinitis of right shoulder: Secondary | ICD-10-CM

## 2024-04-09 DIAGNOSIS — M542 Cervicalgia: Secondary | ICD-10-CM | POA: Diagnosis not present

## 2024-04-09 DIAGNOSIS — G8929 Other chronic pain: Secondary | ICD-10-CM

## 2024-04-09 DIAGNOSIS — M25511 Pain in right shoulder: Secondary | ICD-10-CM | POA: Diagnosis not present

## 2024-04-09 DIAGNOSIS — M5030 Other cervical disc degeneration, unspecified cervical region: Secondary | ICD-10-CM

## 2024-04-09 DIAGNOSIS — M5412 Radiculopathy, cervical region: Secondary | ICD-10-CM

## 2024-04-10 ENCOUNTER — Ambulatory Visit

## 2024-04-10 ENCOUNTER — Other Ambulatory Visit (HOSPITAL_COMMUNITY): Payer: Self-pay

## 2024-04-10 DIAGNOSIS — R293 Abnormal posture: Secondary | ICD-10-CM

## 2024-04-10 DIAGNOSIS — S46811S Strain of other muscles, fascia and tendons at shoulder and upper arm level, right arm, sequela: Secondary | ICD-10-CM

## 2024-04-10 DIAGNOSIS — G8929 Other chronic pain: Secondary | ICD-10-CM
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
## 2024-04-11 ENCOUNTER — Ambulatory Visit: Payer: Self-pay | Admitting: Sports Medicine

## 2024-04-12 ENCOUNTER — Encounter: Payer: Self-pay | Admitting: Physical Therapy

## 2024-04-12 ENCOUNTER — Ambulatory Visit: Admitting: Physical Therapy

## 2024-04-12 DIAGNOSIS — S46811S Strain of other muscles, fascia and tendons at shoulder and upper arm level, right arm, sequela: Secondary | ICD-10-CM

## 2024-04-12 DIAGNOSIS — R293 Abnormal posture: Secondary | ICD-10-CM

## 2024-04-12 DIAGNOSIS — G8929 Other chronic pain: Secondary | ICD-10-CM
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
## 2024-04-12 NOTE — Patient Instructions (Addendum)
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
## 2024-04-17 ENCOUNTER — Ambulatory Visit

## 2024-04-17 ENCOUNTER — Ambulatory Visit: Admitting: Sports Medicine

## 2024-04-17 ENCOUNTER — Other Ambulatory Visit: Payer: Self-pay

## 2024-04-17 VITALS — BP 122/70 | HR 75 | Ht 64.0 in | Wt 226.0 lb

## 2024-04-17 DIAGNOSIS — R293 Abnormal posture: Secondary | ICD-10-CM

## 2024-04-17 DIAGNOSIS — M7531 Calcific tendinitis of right shoulder: Secondary | ICD-10-CM | POA: Diagnosis not present

## 2024-04-17 DIAGNOSIS — G8929 Other chronic pain: Secondary | ICD-10-CM | POA: Diagnosis not present

## 2024-04-17 DIAGNOSIS — M5030 Other cervical disc degeneration, unspecified cervical region: Secondary | ICD-10-CM

## 2024-04-17 DIAGNOSIS — M19011 Primary osteoarthritis, right shoulder: Secondary | ICD-10-CM | POA: Diagnosis not present

## 2024-04-17 DIAGNOSIS — S46811S Strain of other muscles, fascia and tendons at shoulder and upper arm level, right arm, sequela: Secondary | ICD-10-CM

## 2024-04-17 DIAGNOSIS — M542 Cervicalgia: Secondary | ICD-10-CM

## 2024-04-17 DIAGNOSIS — M5412 Radiculopathy, cervical region: Secondary | ICD-10-CM

## 2024-04-17 DIAGNOSIS — M25511 Pain in right shoulder: Secondary | ICD-10-CM | POA: Diagnosis not present

## 2024-04-17 NOTE — Patient Instructions (Signed)
 Epidural right C6-7  Follow up 2 weeks after epidural to discuss results
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
## 2024-04-21 ENCOUNTER — Ambulatory Visit

## 2024-04-21 DIAGNOSIS — S46811S Strain of other muscles, fascia and tendons at shoulder and upper arm level, right arm, sequela: Secondary | ICD-10-CM

## 2024-04-21 DIAGNOSIS — R293 Abnormal posture: Secondary | ICD-10-CM

## 2024-04-21 DIAGNOSIS — G8929 Other chronic pain: Secondary | ICD-10-CM
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
## 2024-04-25 ENCOUNTER — Ambulatory Visit (INDEPENDENT_AMBULATORY_CARE_PROVIDER_SITE_OTHER): Admitting: Neurology

## 2024-04-25 ENCOUNTER — Ambulatory Visit

## 2024-04-25 DIAGNOSIS — G43409 Hemiplegic migraine, not intractable, without status migrainosus: Secondary | ICD-10-CM

## 2024-04-25 DIAGNOSIS — R0602 Shortness of breath: Secondary | ICD-10-CM

## 2024-04-25 DIAGNOSIS — S46811S Strain of other muscles, fascia and tendons at shoulder and upper arm level, right arm, sequela: Secondary | ICD-10-CM

## 2024-04-25 DIAGNOSIS — R5383 Other fatigue: Secondary | ICD-10-CM

## 2024-04-25 DIAGNOSIS — G4733 Obstructive sleep apnea (adult) (pediatric): Secondary | ICD-10-CM | POA: Diagnosis not present

## 2024-04-25 DIAGNOSIS — R293 Abnormal posture: Secondary | ICD-10-CM

## 2024-04-25 DIAGNOSIS — Z6839 Body mass index (BMI) 39.0-39.9, adult: Secondary | ICD-10-CM

## 2024-04-25 DIAGNOSIS — G8929 Other chronic pain: Secondary | ICD-10-CM

## 2024-04-26 NOTE — Progress Notes (Signed)
 [facility] 53 year old female 20-Apr-1971   HOME SLEEP TEST REPORT ( by Watch PAT)   STUDY DATE:  04-25-2024    ORDERING CLINICIAN:  Dedra Gores, MD  REFERRING CLINICIAN:  Harlene Schroeder, MD    CLINICAL INFORMATION/HISTORY:  03-22-2024: CD OSA on CPAP, BMI 39.5,  hemiplegic migraines. Current CPAP from 12-2018, this is her second machine.  Reportedly snoring through CPAP.  I have the pleasure of seeing [name] on 03/22/24 , who is a regular CPAP user and had only one sleep study in [place] in  the time between 2005 and 2010 by Dr Nyra, when her weight was 220 lbs . She was snoring and had been told she stopped breathing at night. CPAP worked well for her but recent supplies have not fitted her well. She was switched by her DME to  a new nasal pillow but the headgear doesn't work for her , is too flimsy. This let to breakthrough snoring. Her CPAP download looks excellent.    She was taking naps in her college years. Irregular dentition.    Epworth sleepiness score: 10/ 24 points  FSS endorsed at 39/ 63 points.      BMI:  39.5 kg/m   Neck Circumference: 18 , high grade Mallampati      Sleep Summary:   Total Recording Time (hours, min):    8 h 11 m     Total Sleep Time (hours, min):     7 h 13 m            Percent REM (%):   23.4                                      Respiratory Indices:   Calculated pAHI (per hour):    AASM based scoring  AHI was 20.8 /h                          REM pAHI:   26/h                                               NREM pAHI:       19.2/h                       Positional  AHI:  supine sleep AHI was 20.7/h and non supine was 20.8/h.   Snoring: Mean volume was 46 dB, loud and present for more than half of the recorded sleep time.                                                  Oxygen Saturation Statistics:   Oxygen Saturation (%) Mean:    93%            O2 Saturation Range (%):         between 88 % and 98%  O2 Saturation (minutes) <89%:     < 1 minute      Pulse Rate Statistics:   Pulse Mean (bpm):   74 bpm               Pulse Range:    between  59  bpm and 105 bpm   Caveat: the watch pat device does not provide data of cardiac rhythm.              IMPRESSION:  This HST confirms the presence of moderate obstructive sleep apnea at an all obstructive AHI of 20.8/h which did not vary much between sleep positions . There was  not much difference of AHI between NREM and REM sleep.     RECOMMENDATION: I recommend to continue the already established positive airway pressure therapy ,with a new RESMED auto CPAP device set between 6 and 12 cm water, with 2 cm water EPR and heated humidification and a mask of patient's comfort (  she has high air leaks and breakthrough snoring on nasal interface) .  A follow up visit with the sleep clinic at [facility] will be scheduled between day 60-90 of CPAP use.     Any CPAP patient should be reminded to be fully compliant with PAP therapy , (defined as using PAP therapy for more than 4 hours each night ) with the goal to improve sleep related symptoms and decrease long term cardiovascular risks. Any PAP therapy patient should be reminded, that it may take up to 3 months to get fully used to using PAP and it may take 1-2 weeks for an established CPAP user to acclimatize to changes in pressure or mask. The earlier full compliance is achieved, the better long term compliance tends to be.   Please note that untreated obstructive sleep apnea may carry additional perioperative morbidity. Patients with significant obstructive sleep apnea should receive perioperative PAP therapy and the surgical team should be informed of the diagnosis and degree of sleep disordered breathing.  Sleep fragmentation in the presence of normal proportional sleep stages is a nonspecific findings and per se does not signify an intrinsic sleep disorder  or a cause for the patient's sleep-related symptoms.  Causes include (but are not limited to) the unfamiliarity of sleeping while recorded by HST device or sleeping in a sleep lab for a full Polysomnography sleep study, but also circadian rhythm disturbances, medication side effects or an underlying mood disorder or medical problem.     INTERPRETING PHYSICIAN: Dedra Gores, MD  [facility] Board certified by The [employer] and Diplomate of the [employer] of [employer]. Board certified In Neurology through the ABPN, Fellow of the [employer] Neu[employer].
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
## 2024-04-27 ENCOUNTER — Ambulatory Visit

## 2024-04-27 DIAGNOSIS — G8929 Other chronic pain: Secondary | ICD-10-CM

## 2024-04-27 DIAGNOSIS — S46811S Strain of other muscles, fascia and tendons at shoulder and upper arm level, right arm, sequela: Secondary | ICD-10-CM | POA: Diagnosis not present

## 2024-04-27 NOTE — Discharge Instructions (Signed)

## 2024-04-28 ENCOUNTER — Ambulatory Visit
Admission: RE | Admit: 2024-04-28 | Discharge: 2024-04-28 | Disposition: A | Discharge status: Home / Self Care | Source: Ambulatory Visit | Attending: Sports Medicine | Admitting: Sports Medicine

## 2024-04-28 DIAGNOSIS — G8929 Other chronic pain: Secondary | ICD-10-CM

## 2024-04-28 DIAGNOSIS — M19011 Primary osteoarthritis, right shoulder: Secondary | ICD-10-CM

## 2024-04-28 DIAGNOSIS — M5412 Radiculopathy, cervical region: Secondary | ICD-10-CM

## 2024-04-28 DIAGNOSIS — M5030 Other cervical disc degeneration, unspecified cervical region: Secondary | ICD-10-CM

## 2024-04-28 DIAGNOSIS — M542 Cervicalgia: Secondary | ICD-10-CM

## 2024-04-28 DIAGNOSIS — M7531 Calcific tendinitis of right shoulder: Secondary | ICD-10-CM

## 2024-04-28 MED ORDER — TRIAMCINOLONE ACETONIDE 40 MG/ML IJ SUSP (RADIOLOGY)
60.0000 mg | Freq: Once | INTRAMUSCULAR | Status: AC
  Administered 2024-04-28: 60 mg via EPIDURAL

## 2024-04-28 MED ORDER — IOPAMIDOL (ISOVUE-M 300) INJECTION 61%
1.0000 mL | Freq: Once | INTRAMUSCULAR | Status: AC
  Administered 2024-04-28: 1 mL via EPIDURAL
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
## 2024-05-03 ENCOUNTER — Ambulatory Visit

## 2024-05-03 ENCOUNTER — Ambulatory Visit: Payer: Self-pay | Admitting: Neurology

## 2024-05-03 DIAGNOSIS — G43409 Hemiplegic migraine, not intractable, without status migrainosus: Secondary | ICD-10-CM | POA: Insufficient documentation

## 2024-05-03 DIAGNOSIS — R0602 Shortness of breath: Secondary | ICD-10-CM

## 2024-05-03 DIAGNOSIS — Z6839 Body mass index (BMI) 39.0-39.9, adult: Secondary | ICD-10-CM

## 2024-05-03 DIAGNOSIS — G4733 Obstructive sleep apnea (adult) (pediatric): Secondary | ICD-10-CM

## 2024-05-03 NOTE — Procedures (Signed)
 [facility] 53 year old female 29-Nov-1970   HOME SLEEP TEST REPORT ( by Watch PAT)   STUDY DATE:  04-25-2024    ORDERING CLINICIAN:  Dedra Gores, MD  REFERRING CLINICIAN:  Harlene Schroeder, MD    CLINICAL INFORMATION/HISTORY:  03-22-2024: CD OSA on CPAP, BMI 39.5,  hemiplegic migraines. Current CPAP was issued in 12-2018, this is her second machine.  Reportedly snoring through CPAP and having high air leakage.   CD: I have the pleasure of seeing [name] on 03/22/24 , who is a regular CPAP user and had only one sleep study in [place] in  the time between 2005 and 2010 by Dr Nyra, when her weight was 220 lbs . She was snoring and had been told she stopped breathing at night. CPAP worked well for her but recent supplies have not fitted her well. She was switched by her DME to  a new nasal pillow but the headgear doesn't work for her , is too flimsy. This let to breakthrough snoring. Her CPAP download looks excellent.     She was taking naps in her college years. Irregular dentition.    Epworth sleepiness score: 10/ 24 points  FSS endorsed at 39/ 63 points.      BMI:  39.5 kg/m   Neck Circumference: 18 , high grade Mallampati      Sleep Summary:   Total Recording Time (hours, min):    8 h 11 m     Total Sleep Time (hours, min):     7 h 13 m            Percent REM (%):   23.4                                      Respiratory Indices:   Calculated pAHI (per hour):    AASM based scoring  AHI was 20.8 /h                          REM pAHI:   26/h                                               NREM pAHI:       19.2/h                       Positional  AHI:  supine sleep AHI was 20.7/h and non supine was 20.8/h.   Snoring: Mean volume was 46 dB, loud and present for more than half of the recorded sleep time.                                                  Oxygen Saturation Statistics:   Oxygen Saturation (%) Mean:    93%             O2 Saturation Range (%):        between 88 % and 98%  O2 Saturation (minutes) <89%:     < 1 minute      Pulse Rate Statistics:   Pulse Mean (bpm):   74 bpm               Pulse Range:    between  59  bpm and 105 bpm   Caveat: the watch pat device does not provide data of cardiac rhythm.              IMPRESSION:  This HST confirms the presence of moderate obstructive sleep apnea at an all obstructive AHI of 20.8/h which did not vary much between sleep positions . There was  not much difference of AHI between NREM and REM sleep.     RECOMMENDATION: I recommend to continue the already established positive airway pressure therapy ,with a new RESMED auto CPAP device set between 6 and 12 cm water, with 2 cm water EPR and heated humidification and a mask of patient's comfort (  she has high air leaks and breakthrough snoring on nasal interface) .  A follow up visit with the sleep clinic at [facility] will be scheduled between day 60-90 of CPAP use.     Any CPAP patient should be reminded to be fully compliant with PAP therapy , (defined as using PAP therapy for more than 4 hours each night ) with the goal to improve sleep related symptoms and decrease long term cardiovascular risks. Any PAP therapy patient should be reminded, that it may take up to 3 months to get fully used to using PAP and it may take 1-2 weeks for an established CPAP user to acclimatize to changes in pressure or mask.  Please note that untreated obstructive sleep apnea may carry additional perioperative morbidity. Patients with significant obstructive sleep apnea should receive perioperative PAP therapy and the surgical team should be informed of the diagnosis and degree of sleep disordered breathing.     INTERPRETING PHYSICIAN: Dedra Gores, MD  [facility] Board certified by The [employer] of Sleep Medicine and Diplomate of the [employer] of  Sleep Medicine. Board certified In Neurology through the ABPN, Fellow of the [employer] of Neu[employer].

## 2024-05-04 ENCOUNTER — Encounter (INDEPENDENT_AMBULATORY_CARE_PROVIDER_SITE_OTHER): Payer: Self-pay | Admitting: Family Medicine

## 2024-05-04 ENCOUNTER — Telehealth (INDEPENDENT_AMBULATORY_CARE_PROVIDER_SITE_OTHER): Admitting: Family Medicine

## 2024-05-04 ENCOUNTER — Other Ambulatory Visit (HOSPITAL_COMMUNITY): Payer: Self-pay

## 2024-05-04 VITALS — Ht 64.0 in | Wt 223.0 lb

## 2024-05-04 DIAGNOSIS — J069 Acute upper respiratory infection, unspecified: Secondary | ICD-10-CM

## 2024-05-04 DIAGNOSIS — E118 Type 2 diabetes mellitus with unspecified complications: Secondary | ICD-10-CM

## 2024-05-04 DIAGNOSIS — E66813 Obesity, class 3: Secondary | ICD-10-CM

## 2024-05-04 DIAGNOSIS — E559 Vitamin D deficiency, unspecified: Secondary | ICD-10-CM

## 2024-05-04 DIAGNOSIS — Z7984 Long term (current) use of oral hypoglycemic drugs: Secondary | ICD-10-CM

## 2024-05-04 DIAGNOSIS — Z6841 Body Mass Index (BMI) 40.0 and over, adult: Secondary | ICD-10-CM

## 2024-05-04 DIAGNOSIS — Z7985 Long-term (current) use of injectable non-insulin antidiabetic drugs: Secondary | ICD-10-CM

## 2024-05-04 DIAGNOSIS — Z6838 Body mass index (BMI) 38.0-38.9, adult: Secondary | ICD-10-CM

## 2024-05-04 DIAGNOSIS — E782 Mixed hyperlipidemia: Secondary | ICD-10-CM

## 2024-05-04 MED ORDER — VITAMIN D (ERGOCALCIFEROL) 1.25 MG (50000 UNIT) PO CAPS
50000.0000 [IU] | ORAL_CAPSULE | ORAL | 0 refills | Status: DC
  Filled 2024-05-04: qty 5, 35d supply, fill #0

## 2024-05-04 MED ORDER — TIRZEPATIDE 12.5 MG/0.5ML ~~LOC~~ SOAJ
12.5000 mg | SUBCUTANEOUS | 0 refills | Status: DC
  Filled 2024-05-04: qty 2, 28d supply, fill #0

## 2024-05-04 MED ORDER — METFORMIN HCL 500 MG PO TABS
500.0000 mg | ORAL_TABLET | Freq: Two times a day (BID) | ORAL | 0 refills | Status: DC
  Filled 2024-05-04: qty 60, 30d supply, fill #0

## 2024-05-04 MED ORDER — ROSUVASTATIN CALCIUM 20 MG PO TABS
20.0000 mg | ORAL_TABLET | Freq: Every day | ORAL | 0 refills | Status: DC
  Filled 2024-05-04: qty 30, 30d supply, fill #0

## 2024-05-04 MED ORDER — DAPAGLIFLOZIN PROPANEDIOL 10 MG PO TABS
10.0000 mg | ORAL_TABLET | Freq: Every day | ORAL | 0 refills | Status: DC
  Filled 2024-05-04: qty 30, 30d supply, fill #0

## 2024-05-04 NOTE — Progress Notes (Unsigned)
 No chief complaint on file.   [name] here for URI complaints.  Duration: {Numbers; 1-10:13787} {Time; day/wk/mo/yr:20843}  Associated symptoms: {URI Symptoms :210800001} Denies: {URI Symptoms :210800001} Treatment to date: *** Sick contacts: {yes/no:20286}  Past Medical History:  Diagnosis Date   Anxiety    Benign ovarian tumor 02/2011   17 pound benign tumor- removed   Depression    Diabetes 1.5, managed as type 2 (HCC)    Fatty liver    High cholesterol    Hypertension    Hypothyroidism    Personal history of colonic polyps    Skin cancer    Sleep apnea    Thyroid  disease    Vitamin D  deficiency     Objective There were no vitals taken for this visit. General: Awake, alert, appears stated age HEENT: AT, [place], ears patent b/l and TM's neg, nares patent w/o discharge, pharynx pink and without exudates, MMM Neck: No masses or asymmetry Heart: RRR Lungs: CTAB, no accessory muscle use Psych: Age appropriate judgment and insight, normal mood and affect  No diagnosis found.  Continue to push fluids, practice good hand hygiene, cover mouth when coughing. F/u prn. If starting to experience fevers, shaking, or shortness of breath, seek immediate care. Pt voiced understanding and agreement to the plan.  Harlene LITTIE Jolly, DNP, AGNP-C 05/04/24 10:32 AM

## 2024-05-04 NOTE — Progress Notes (Signed)
 Office: [phone]  /  Fax: [phone]  WEIGHT SUMMARY AND BIOMETRICS  Anthropometric Measurements Height: 5' 4 (1.626 m) Weight: 223 lb (101.2 kg) (last ov weight) BMI (Calculated): 38.26 Starting Weight: 275 lb Peak Weight: 287 lb   No data recorded Other Clinical Data Fasting: no Labs: no Today's Visit #: 22 Starting Date: 08/06/22 Comments: my chart video visit    Chief Complaint: OBESITY   Virtual Visit via A/V Note  I connected with [name] on 05/04/24 at  9:00 AM EDT by audiovisual telehealth and verified that I am speaking with the correct person using two identifiers.  Location: Patient: home Provider: clinic   I discussed the limitations, risks, security and privacy concerns of performing an evaluation and management service by AV telehealth and the availability of in person appointments. I also discussed with the patient that there may be a patient responsible charge related to this service. The patient expressed understanding and agreed to proceed.     History of Present Illness [name] is a 53 year old female with obesity and type 2 diabetes who presents for obesity treatment and progress assessment.  She is adhering to a low-carb diet plan approximately 75% of the time, resulting in weight loss over the past month. She is actively reducing simple carbohydrates and increasing physical activity to manage her diabetes and obesity.  Her type 2 diabetes is managed with Mounjaro  12.5 mg, metformin  500 mg twice daily, and Farxiga  10 mg. She requests refills for these medications and reports stability in her diabetes with no side effects.  She has hyperlipidemia associated with her diabetes and is on Crestor  20 mg, for which she requests a refill. She is reducing saturated fats and high cholesterol foods to manage her condition.  She has a vitamin D  deficiency treated with ergocalciferol  50,000 units per week, requesting a refill. She reports  no side effects from her vitamin D  supplementation.  She was previously walking four days a week for 30 minutes but has experienced an upper respiratory tract infection with sinus congestion and sore throat for five days. A COVID test was negative, and she has not noticed any improvement in symptoms.      PHYSICAL EXAM:  Height 5' 4 (1.626 m), weight 223 lb (101.2 kg). Body mass index is 38.28 kg/m.  DIAGNOSTIC DATA REVIEWED:  BMET    Component Value Date/Time   NA 139 12/09/2023 0753   K 4.1 12/09/2023 0753   CL 103 12/09/2023 0753   CO2 21 12/09/2023 0753   GLUCOSE 77 12/09/2023 0753   GLUCOSE 176 (H) 07/20/2022 1334   BUN 22 12/09/2023 0753   CREATININE 0.61 12/09/2023 0753   CREATININE 0.60 05/26/2019 1616   CALCIUM  9.0 12/09/2023 0753   GFRNONAA >60 08/29/2019 1150   GFRNONAA 108 05/26/2019 1616   GFRAA >60 08/29/2019 1150   GFRAA 125 05/26/2019 1616   Lab Results  Component Value Date   HGBA1C 5.3 12/09/2023   HGBA1C 5.6 07/24/2015   Lab Results  Component Value Date   INSULIN  28.2 (H) 12/09/2023   INSULIN  50.6 (H) 08/06/2022   Lab Results  Component Value Date   TSH 3.670 01/04/2024   CBC    Component Value Date/Time   WBC 7.6 05/13/2023 1038   WBC 7.4 01/26/2022 0914   RBC 4.42 05/13/2023 1038   RBC 4.38 01/26/2022 0914   HGB 14.1 05/13/2023 1038   HCT 41.5 05/13/2023 1038   PLT 296 05/13/2023 1038   MCV  94 05/13/2023 1038   MCH 31.9 05/13/2023 1038   MCH 31.1 08/29/2019 1150   MCHC 34.0 05/13/2023 1038   MCHC 33.9 01/26/2022 0914   RDW 12.6 05/13/2023 1038   Iron Studies    Component Value Date/Time   FERRITIN 51.1 06/15/2016 1218   Lipid Panel     Component Value Date/Time   CHOL 128 01/04/2024 0918   TRIG 120 01/04/2024 0918   HDL 44 01/04/2024 0918   CHOLHDL 3 01/26/2022 0914   VLDL 32.8 01/26/2022 0914   LDLCALC 62 01/04/2024 0918   LDLDIRECT 118.0 01/08/2021 1438   Hepatic Function Panel     Component Value Date/Time    PROT 7.0 12/09/2023 0753   ALBUMIN 4.3 12/09/2023 0753   AST 16 12/09/2023 0753   ALT 22 12/09/2023 0753   ALKPHOS 88 12/09/2023 0753   BILITOT 0.5 12/09/2023 0753   BILIDIR 0.10 02/06/2020 0947      Component Value Date/Time   TSH 3.670 01/04/2024 0918   Nutritional Lab Results  Component Value Date   VD25OH 39.8 12/09/2023   VD25OH 54.3 05/13/2023   VD25OH 60.6 12/10/2022     Assessment and Plan Assessment & Plan Class 3 Obesity Class 3 obesity with ongoing weight management efforts. She is following a low carb diet plan approximately 75% of the time and has been walking four days a week for 30 minutes prior to recent illness. Reports weight loss in the last month. - Continue low carb diet plan - Encourage regular physical activity as tolerated  Type 2 Diabetes Mellitus Type 2 diabetes mellitus, currently well-managed on Mounjaro  12.5 mg, metformin  500 mg twice daily, and Farxiga  10 mg. No side effects reported. She is working on decreasing simple carbohydrates and increasing exercise. - Refill Mounjaro  12.5 mg - Refill metformin  500 mg twice daily - Refill Farxiga  10 mg  Mixed Hyperlipidemia Mixed hyperlipidemia associated with type 2 diabetes, well-managed on Crestor  20 mg. She is working on decreasing saturated fats and high cholesterol foods, and weight loss is beneficial for management. - Refill Crestor  20 mg  Vitamin D  Deficiency Vitamin D  deficiency, well-managed on ergocalciferol  50,000 units weekly. No side effects reported. - Refill ergocalciferol  50,000 units weekly  Acute Upper Respiratory Tract Infection Acute upper respiratory tract infection with sinus congestion and sore throat. COVID test negative. Symptoms have persisted for five days without improvement. Differential includes strep throat or other viral infections. - Advise alternating hot and cold liquids for sore throat - Recommend ibuprofen or Tylenol  for fever and pain - Advise to seek evaluation  if symptoms do not improve by tomorrow       Sierra was informed of the importance of frequent follow up visits to maximize her success with intensive lifestyle modifications for her obesity and obesity related health conditions as recommended by USPSTF and CMS guidelines   Louann Penton, MD

## 2024-05-05 ENCOUNTER — Encounter: Payer: Self-pay | Admitting: Student

## 2024-05-05 ENCOUNTER — Ambulatory Visit: Admitting: Student

## 2024-05-05 ENCOUNTER — Other Ambulatory Visit (HOSPITAL_COMMUNITY): Payer: Self-pay

## 2024-05-05 VITALS — BP 159/83 | HR 90 | Temp 98.4°F | Ht 64.0 in | Wt 219.8 lb

## 2024-05-05 DIAGNOSIS — R0981 Nasal congestion: Secondary | ICD-10-CM | POA: Diagnosis not present

## 2024-05-05 DIAGNOSIS — J069 Acute upper respiratory infection, unspecified: Secondary | ICD-10-CM | POA: Diagnosis not present

## 2024-05-05 MED ORDER — FLUCONAZOLE 150 MG PO TABS
150.0000 mg | ORAL_TABLET | ORAL | 0 refills | Status: AC
  Filled 2024-05-05: qty 2, 3d supply, fill #0

## 2024-05-05 MED ORDER — FLUTICASONE PROPIONATE 50 MCG/ACT NA SUSP
2.0000 | Freq: Every day | NASAL | 0 refills | Status: AC
  Filled 2024-05-05: qty 16, 30d supply, fill #0

## 2024-05-05 MED ORDER — AZITHROMYCIN 250 MG PO TABS
ORAL_TABLET | ORAL | 0 refills | Status: AC
  Filled 2024-05-05: qty 6, 5d supply, fill #0

## 2024-05-05 MED ORDER — GUAIFENESIN ER 600 MG PO TB12
1200.0000 mg | ORAL_TABLET | Freq: Two times a day (BID) | ORAL | 0 refills | Status: AC
  Filled 2024-05-05: qty 30, 8d supply, fill #0
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
## 2024-05-08 ENCOUNTER — Ambulatory Visit
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
## 2024-05-10 NOTE — Telephone Encounter (Signed)
 Kennie Karapetian D, CMA  Zott, Gasper Ona, Tammy; Darrel Boyer New orders have been placed for the above pt, DOB: [birth year] Thanks

## 2024-05-11 ENCOUNTER — Ambulatory Visit: Attending: Family Medicine

## 2024-05-11 DIAGNOSIS — R293 Abnormal posture: Secondary | ICD-10-CM | POA: Insufficient documentation

## 2024-05-11 DIAGNOSIS — G8929 Other chronic pain: Secondary | ICD-10-CM | POA: Insufficient documentation

## 2024-05-11 DIAGNOSIS — S46811S Strain of other muscles, fascia and tendons at shoulder and upper arm level, right arm, sequela: Secondary | ICD-10-CM | POA: Diagnosis present

## 2024-05-11 DIAGNOSIS — M25511 Pain in right shoulder: Secondary | ICD-10-CM | POA: Diagnosis present

## 2024-05-12 ENCOUNTER — Ambulatory Visit: Admitting: Sports Medicine

## 2024-05-15 ENCOUNTER — Ambulatory Visit

## 2024-05-15 ENCOUNTER — Telehealth (INDEPENDENT_AMBULATORY_CARE_PROVIDER_SITE_OTHER): Payer: Self-pay

## 2024-05-15 ENCOUNTER — Ambulatory Visit: Admitting: Sports Medicine

## 2024-05-15 VITALS — HR 89 | Ht 64.0 in | Wt 199.0 lb

## 2024-05-15 DIAGNOSIS — M5030 Other cervical disc degeneration, unspecified cervical region: Secondary | ICD-10-CM

## 2024-05-15 DIAGNOSIS — G8929 Other chronic pain: Secondary | ICD-10-CM

## 2024-05-15 DIAGNOSIS — M25511 Pain in right shoulder: Secondary | ICD-10-CM

## 2024-05-15 DIAGNOSIS — M5412 Radiculopathy, cervical region: Secondary | ICD-10-CM

## 2024-05-15 DIAGNOSIS — M19011 Primary osteoarthritis, right shoulder: Secondary | ICD-10-CM | POA: Diagnosis not present

## 2024-05-15 DIAGNOSIS — M542 Cervicalgia: Secondary | ICD-10-CM

## 2024-05-15 DIAGNOSIS — M7531 Calcific tendinitis of right shoulder: Secondary | ICD-10-CM

## 2024-05-15 NOTE — Telephone Encounter (Signed)
 Prior auth started for Mounjaro  12.5 mg, for T2DM   [employer] has not yet replied to your PA request. You may close this dialog, return to your dashboard, and perform other tasks.  To check for an update later, open this request again from your dashboard.  If [employer] has not replied to your request within 72 hours please contact [employer] at [phone].
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
## 2024-05-16 NOTE — Telephone Encounter (Signed)
 MOUNJARO  12.5 MG/0.5 ML PEN has been approved through 05/15/2025.   Patient notified via my chart.

## 2024-05-18 ENCOUNTER — Ambulatory Visit

## 2024-06-01 ENCOUNTER — Other Ambulatory Visit (HOSPITAL_COMMUNITY): Payer: Self-pay

## 2024-06-01 ENCOUNTER — Ambulatory Visit (INDEPENDENT_AMBULATORY_CARE_PROVIDER_SITE_OTHER): Admitting: Family Medicine

## 2024-06-01 ENCOUNTER — Encounter (INDEPENDENT_AMBULATORY_CARE_PROVIDER_SITE_OTHER): Payer: Self-pay | Admitting: Family Medicine

## 2024-06-01 VITALS — BP 126/82 | HR 71 | Temp 97.7°F | Ht 64.0 in | Wt 219.0 lb

## 2024-06-01 DIAGNOSIS — J069 Acute upper respiratory infection, unspecified: Secondary | ICD-10-CM | POA: Diagnosis not present

## 2024-06-01 DIAGNOSIS — Z6838 Body mass index (BMI) 38.0-38.9, adult: Secondary | ICD-10-CM

## 2024-06-01 DIAGNOSIS — E559 Vitamin D deficiency, unspecified: Secondary | ICD-10-CM | POA: Diagnosis not present

## 2024-06-01 DIAGNOSIS — E782 Mixed hyperlipidemia: Secondary | ICD-10-CM | POA: Diagnosis not present

## 2024-06-01 DIAGNOSIS — Z7984 Long term (current) use of oral hypoglycemic drugs: Secondary | ICD-10-CM

## 2024-06-01 DIAGNOSIS — E118 Type 2 diabetes mellitus with unspecified complications: Secondary | ICD-10-CM

## 2024-06-01 DIAGNOSIS — Z7985 Long-term (current) use of injectable non-insulin antidiabetic drugs: Secondary | ICD-10-CM

## 2024-06-01 DIAGNOSIS — E119 Type 2 diabetes mellitus without complications: Secondary | ICD-10-CM | POA: Diagnosis not present

## 2024-06-01 DIAGNOSIS — Z6837 Body mass index (BMI) 37.0-37.9, adult: Secondary | ICD-10-CM

## 2024-06-01 DIAGNOSIS — E669 Obesity, unspecified: Secondary | ICD-10-CM

## 2024-06-01 MED ORDER — TIRZEPATIDE 12.5 MG/0.5ML ~~LOC~~ SOAJ
12.5000 mg | SUBCUTANEOUS | 0 refills | Status: DC
  Filled 2024-06-01: qty 6, 84d supply, fill #0

## 2024-06-01 MED ORDER — DAPAGLIFLOZIN PROPANEDIOL 10 MG PO TABS
10.0000 mg | ORAL_TABLET | Freq: Every day | ORAL | 0 refills | Status: DC
  Filled 2024-06-01: qty 90, 90d supply, fill #0

## 2024-06-01 MED ORDER — ROSUVASTATIN CALCIUM 20 MG PO TABS
20.0000 mg | ORAL_TABLET | Freq: Every day | ORAL | 0 refills | Status: DC
  Filled 2024-06-01: qty 90, 90d supply, fill #0

## 2024-06-01 MED ORDER — METFORMIN HCL 500 MG PO TABS
500.0000 mg | ORAL_TABLET | Freq: Two times a day (BID) | ORAL | 0 refills | Status: DC
  Filled 2024-06-01: qty 180, 90d supply, fill #0

## 2024-06-01 MED ORDER — VITAMIN D (ERGOCALCIFEROL) 1.25 MG (50000 UNIT) PO CAPS
50000.0000 [IU] | ORAL_CAPSULE | ORAL | 0 refills | Status: DC
  Filled 2024-06-01: qty 12, 84d supply, fill #0

## 2024-06-01 NOTE — Progress Notes (Signed)
 Office: [phone]  /  Fax: [phone]  WEIGHT SUMMARY AND BIOMETRICS  Anthropometric Measurements Height: 5' 4 (1.626 m) Weight: 219 lb (99.3 kg) BMI (Calculated): 37.57 Weight at Last Visit: 223 lb Weight Lost Since Last Visit: 4 lb Weight Gained Since Last Visit: 0 Starting Weight: 275 lb Total Weight Loss (lbs): 56 lb (25.4 kg) Peak Weight: 287 lb   Body Composition  Body Fat %: 47.1 % Fat Mass (lbs): 103.2 lbs Muscle Mass (lbs): 110 lbs Total Body Water (lbs): 82.8 lbs Visceral Fat Rating : 13   Other Clinical Data Fasting: yes Labs: yes Today's Visit #: 23 Starting Date: 08/06/22    Chief Complaint: OBESITY    History of Present Illness [name] is a 53 year old female with obesity and type 2 diabetes who presents for obesity treatment plan assessment and progress evaluation.  She has experienced a four-pound weight loss over the past month. However, for the past three weeks, she has been feeling unwell, which has disrupted her normal eating and exercise routines. She attributes her symptoms to a lingering cold contracted from her son, characterized by congestion, headache, fatigue, sore throat, and a tickly cough, all of which have affected her sleep. She tested negative for COVID-19 at home and was prescribed antibiotics by another physician, which did not alleviate her symptoms. She is now in the fourth week of her illness and is beginning to resume her regular activities.  She is currently taking Mounjaro  and metformin  for her type 2 diabetes and requests a refill. She is also on Farxiga  for diabetes management. She reports no issues with nausea and is able to maintain her medication regimen.  She is on Crestor  for hyperlipidemia and requests a refill.   She takes prescription ergocalciferol  50,000 IU weekly for her vit D deficiency and requests a refill.  Her family history includes heart disease and strokes, which are of concern to her. She  has a history of precancerous cells and a significant surgical experience that resulted in ICU admission due to blood loss. She is on a three-year follow-up schedule for colon health due to this history.  She has been following dietary strategies to manage her weight, alternating between a low-carb diet and a category four diet. Her weight had dropped to 219 pounds, breaking a plateau between 220 and 230 pounds. Her recent illness disrupted her exercise and meal preparation routines, leading to increased carbohydrate consumption.      PHYSICAL EXAM:  Blood pressure 126/82, pulse 71, temperature 97.7 F (36.5 C), height 5' 4 (1.626 m), weight 219 lb (99.3 kg), SpO2 98%. Body mass index is 37.59 kg/m.  DIAGNOSTIC DATA REVIEWED:  BMET    Component Value Date/Time   NA 139 12/09/2023 0753   K 4.1 12/09/2023 0753   CL 103 12/09/2023 0753   CO2 21 12/09/2023 0753   GLUCOSE 77 12/09/2023 0753   GLUCOSE 176 (H) 07/20/2022 1334   BUN 22 12/09/2023 0753   CREATININE 0.61 12/09/2023 0753   CREATININE 0.60 05/26/2019 1616   CALCIUM  9.0 12/09/2023 0753   GFRNONAA >60 08/29/2019 1150   GFRNONAA 108 05/26/2019 1616   GFRAA >60 08/29/2019 1150   GFRAA 125 05/26/2019 1616   Lab Results  Component Value Date   HGBA1C 5.3 12/09/2023   HGBA1C 5.6 07/24/2015   Lab Results  Component Value Date   INSULIN  28.2 (H) 12/09/2023   INSULIN  50.6 (H) 08/06/2022   Lab Results  Component Value Date   TSH  3.670 01/04/2024   CBC    Component Value Date/Time   WBC 7.6 05/13/2023 1038   WBC 7.4 01/26/2022 0914   RBC 4.42 05/13/2023 1038   RBC 4.38 01/26/2022 0914   HGB 14.1 05/13/2023 1038   HCT 41.5 05/13/2023 1038   PLT 296 05/13/2023 1038   MCV 94 05/13/2023 1038   MCH 31.9 05/13/2023 1038   MCH 31.1 08/29/2019 1150   MCHC 34.0 05/13/2023 1038   MCHC 33.9 01/26/2022 0914   RDW 12.6 05/13/2023 1038   Iron Studies    Component Value Date/Time   FERRITIN 51.1 06/15/2016 1218   Lipid  Panel     Component Value Date/Time   CHOL 128 01/04/2024 0918   TRIG 120 01/04/2024 0918   HDL 44 01/04/2024 0918   CHOLHDL 3 01/26/2022 0914   VLDL 32.8 01/26/2022 0914   LDLCALC 62 01/04/2024 0918   LDLDIRECT 118.0 01/08/2021 1438   Hepatic Function Panel     Component Value Date/Time   PROT 7.0 12/09/2023 0753   ALBUMIN 4.3 12/09/2023 0753   AST 16 12/09/2023 0753   ALT 22 12/09/2023 0753   ALKPHOS 88 12/09/2023 0753   BILITOT 0.5 12/09/2023 0753   BILIDIR 0.10 02/06/2020 0947      Component Value Date/Time   TSH 3.670 01/04/2024 0918   Nutritional Lab Results  Component Value Date   VD25OH 39.8 12/09/2023   VD25OH 54.3 05/13/2023   VD25OH 60.6 12/10/2022     Assessment and Plan Assessment & Plan Type 2 diabetes mellitus Type 2 diabetes mellitus is well-managed with Mounjaro , metformin , and Farxiga . Current regimen is effective with no issues in medication adherence or side effects. Although increasing Mounjaro  dosage could further decrease hunger and improve insulin  efficacy, current hunger levels are manageable. - Refill Mounjaro , metformin , and Farxiga  for 90 days  Mixed hyperlipidemia Mixed hyperlipidemia is managed with Crestor  and lifestyle modifications. No issues with current treatment regimen. - Refill Crestor  for 90 days  Vitamin D  deficiency Vitamin D  deficiency is managed with prescription ergocalciferol  50,000 IU weekly. No issues with current treatment regimen. - Refill ergocalciferol  for 90 days  Prolonged upper respiratory symptoms (congestion, cough, fatigue) Prolonged upper respiratory symptoms, including congestion, cough, and fatigue, have persisted for four weeks. Initial antibiotic treatment was ineffective, suggesting a viral etiology. Symptoms are gradually improving. She is advised to avoid exposure to sick individuals due to a weakened immune system.  Obesity Obesity is managed with lifestyle modifications and pharmacotherapy. Recent  illness impacted her ability to maintain exercise and dietary regimen, resulting in temporary weight fluctuation. She has lost four pounds since the last visit and is motivated to resume weight management strategies. Current regimen is effective, with emphasis on maintaining a low-carb diet and managing stress-induced eating. - Continue current obesity management strategies - Encourage adherence to low-carb diet and stress management techniques  Goals of Baker She expresses a desire to maintain health to prevent cardiovascular events and to be present for family milestones. Emphasis on proactive health management to mitigate familial risk factors for heart disease and stroke.  Follow-Up Next appointment scheduled for December 1st. Blood work to be conducted during the current visit to assess kidney, liver, electrolytes, cholesterol, vitamin levels, CBC, and A1c. Additional tests for heart disease risk assessment due to family history. - Schedule follow-up appointment for January - Conduct blood work including kidney, liver, electrolytes, cholesterol, vitamin levels, CBC, A1c, and additional heart disease risk tests  Laysha was counseled on the importance of  maintaining healthy lifestyle habits, including balanced nutrition, regular physical activity, and behavioral modifications, while taking antiobesity medication.  Patient verbalized understanding that medication is an adjunct to, not a replacement for, lifestyle changes and that the long-term success and weight maintenance depend on continued adherence to these strategies.   Dyanna was informed of the importance of frequent follow up visits to maximize her success with intensive lifestyle modifications for her obesity and obesity related health conditions as recommended by USPSTF and CMS guidelines   Louann Penton, MD

## 2024-06-02 ENCOUNTER — Ambulatory Visit: Payer: Self-pay

## 2024-06-02 LAB — CBC WITH DIFFERENTIAL/PLATELET
Basophils Absolute: 0.1 x10E3/uL (ref 0.0–0.2)
Basos: 1 %
EOS (ABSOLUTE): 0.1 x10E3/uL (ref 0.0–0.4)
Eos: 1 %
Hematocrit: 44.9 % (ref 34.0–46.6)
Hemoglobin: 14.9 g/dL (ref 11.1–15.9)
Immature Grans (Abs): 0 x10E3/uL (ref 0.0–0.1)
Immature Granulocytes: 0 %
Lymphocytes Absolute: 2.7 x10E3/uL (ref 0.7–3.1)
Lymphs: 32 %
MCH: 31.5 pg (ref 26.6–33.0)
MCHC: 33.2 g/dL (ref 31.5–35.7)
MCV: 95 fL (ref 79–97)
Monocytes Absolute: 0.7 x10E3/uL (ref 0.1–0.9)
Monocytes: 8 %
Neutrophils Absolute: 4.9 x10E3/uL (ref 1.4–7.0)
Neutrophils: 58 %
Platelets: 322 x10E3/uL (ref 150–450)
RBC: 4.73 x10E6/uL (ref 3.77–5.28)
RDW: 12.8 % (ref 11.7–15.4)
WBC: 8.5 x10E3/uL (ref 3.4–10.8)

## 2024-06-02 LAB — CMP14+EGFR
ALT: 27 IU/L (ref 0–32)
AST: 20 IU/L (ref 0–40)
Albumin: 4.6 g/dL (ref 3.8–4.9)
Alkaline Phosphatase: 76 IU/L (ref 49–135)
BUN/Creatinine Ratio: 30 — ABNORMAL HIGH (ref 9–23)
BUN: 19 mg/dL (ref 6–24)
Bilirubin Total: 0.6 mg/dL (ref 0.0–1.2)
CO2: 23 mmol/L (ref 20–29)
Calcium: 9.7 mg/dL (ref 8.7–10.2)
Chloride: 101 mmol/L (ref 96–106)
Creatinine, Ser: 0.63 mg/dL (ref 0.57–1.00)
Globulin, Total: 2.9 g/dL (ref 1.5–4.5)
Glucose: 66 mg/dL — ABNORMAL LOW (ref 70–99)
Potassium: 4.2 mmol/L (ref 3.5–5.2)
Sodium: 140 mmol/L (ref 134–144)
Total Protein: 7.5 g/dL (ref 6.0–8.5)
eGFR: 106 mL/min/1.73 (ref >59)

## 2024-06-02 LAB — LIPID PANEL+APOB
Apolipoprotein B: 74 mg/dL (ref <90)
Cholesterol, Total: 143 mg/dL (ref 100–199)
HDL-C: 63 mg/dL (ref >39)
LDL-C (NIH Calc): 64 mg/dL (ref 0–99)
Non-HDL Cholesterol: 80 mg/dL (ref 0–129)
Triglycerides: 84 mg/dL (ref 0–149)

## 2024-06-02 LAB — VITAMIN B12: Vitamin B-12: 887 pg/mL (ref 232–1245)

## 2024-06-02 LAB — VITAMIN D 25 HYDROXY (VIT D DEFICIENCY, FRACTURES): Vit D, 25-Hydroxy: 46.6 ng/mL (ref 30.0–100.0)

## 2024-06-02 LAB — HEMOGLOBIN A1C
Est. average glucose Bld gHb Est-mCnc: 105 mg/dL
Hgb A1c MFr Bld: 5.3 % (ref 4.8–5.6)

## 2024-06-02 LAB — LIPOPROTEIN A (LPA): Lipoprotein (a): 266.4 nmol/L — ABNORMAL HIGH (ref <75.0)

## 2024-06-02 LAB — INSULIN, RANDOM: INSULIN: 14.9 u[IU]/mL (ref 2.6–24.9)

## 2024-06-05 ENCOUNTER — Ambulatory Visit: Admitting: Adult Health

## 2024-06-05 ENCOUNTER — Encounter: Payer: Self-pay | Admitting: Radiology

## 2024-06-12 NOTE — Addendum Note (Signed)
 Addended by: CHALICE SAUNAS on: 06/12/2024 07:51 PM   Modules accepted: Orders

## 2024-06-12 NOTE — Telephone Encounter (Signed)
 I have ordered DME to reset the machine to 12 cm water pressure.  I hope this will work well for her. Thank you for providing the data, Dedra BIRCH.

## 2024-06-12 NOTE — Telephone Encounter (Signed)
 See pt MC msg below, do you agree to adjust settings?

## 2024-06-13 NOTE — Telephone Encounter (Signed)
 pressure change Received: Today Keath Matera D, CMA  New, Bradley; Ziegler, Melissa; Cain, Leveda Dollar, Dolanda New orders have been placed for the above pt, DOB: Feb 06, 2071 Thanks

## 2024-06-27 ENCOUNTER — Ambulatory Visit (INDEPENDENT_AMBULATORY_CARE_PROVIDER_SITE_OTHER): Admitting: Family Medicine

## 2024-07-03 ENCOUNTER — Ambulatory Visit (INDEPENDENT_AMBULATORY_CARE_PROVIDER_SITE_OTHER): Payer: Self-pay | Admitting: Family Medicine

## 2024-07-03 ENCOUNTER — Encounter (INDEPENDENT_AMBULATORY_CARE_PROVIDER_SITE_OTHER): Payer: Self-pay | Admitting: Family Medicine

## 2024-07-03 ENCOUNTER — Other Ambulatory Visit (HOSPITAL_COMMUNITY): Payer: Self-pay

## 2024-07-03 VITALS — BP 135/81 | HR 82 | Temp 98.3°F | Ht 64.0 in | Wt 224.0 lb

## 2024-07-03 DIAGNOSIS — E7841 Elevated Lipoprotein(a): Secondary | ICD-10-CM | POA: Diagnosis not present

## 2024-07-03 DIAGNOSIS — E782 Mixed hyperlipidemia: Secondary | ICD-10-CM | POA: Diagnosis not present

## 2024-07-03 DIAGNOSIS — E118 Type 2 diabetes mellitus with unspecified complications: Secondary | ICD-10-CM

## 2024-07-03 DIAGNOSIS — E559 Vitamin D deficiency, unspecified: Secondary | ICD-10-CM | POA: Diagnosis not present

## 2024-07-03 DIAGNOSIS — E119 Type 2 diabetes mellitus without complications: Secondary | ICD-10-CM

## 2024-07-03 DIAGNOSIS — Z6838 Body mass index (BMI) 38.0-38.9, adult: Secondary | ICD-10-CM

## 2024-07-03 DIAGNOSIS — I10 Essential (primary) hypertension: Secondary | ICD-10-CM

## 2024-07-03 DIAGNOSIS — Z7985 Long-term (current) use of injectable non-insulin antidiabetic drugs: Secondary | ICD-10-CM

## 2024-07-03 DIAGNOSIS — Z7984 Long term (current) use of oral hypoglycemic drugs: Secondary | ICD-10-CM

## 2024-07-03 MED ORDER — LOSARTAN POTASSIUM 25 MG PO TABS
12.5000 mg | ORAL_TABLET | Freq: Every day | ORAL | 0 refills | Status: DC
  Filled 2024-07-03: qty 45, 90d supply, fill #0

## 2024-07-03 NOTE — Progress Notes (Signed)
 Office: [phone]  /  Fax: [phone]  WEIGHT SUMMARY AND BIOMETRICS  Anthropometric Measurements Height: 5' 4 (1.626 m) Weight: 224 lb (101.6 kg) BMI (Calculated): 38.43 Weight at Last Visit: 219 lb Weight Lost Since Last Visit: 0 Weight Gained Since Last Visit: 5 lb Starting Weight: 275 lb Total Weight Loss (lbs): 51 lb (23.1 kg) Peak Weight: 287 lb   Body Composition  Body Fat %: 47.6 % Fat Mass (lbs): 107 lbs Muscle Mass (lbs): 111.8 lbs Total Body Water (lbs): 85 lbs Visceral Fat Rating : 14   Other Clinical Data Fasting: yes Labs: no Today's Visit #: 24 Starting Date: 08/06/22    Chief Complaint: OBESITY     History of Present Illness [name] is a 53 year old female with obesity, hyperlipidemia, and type 2 diabetes who presents for obesity treatment and progress assessment.  She is adhering to the prescribed category four eating plan approximately sixty percent of the time and finds it challenging to consume sufficient protein. She engages in physical activity by walking for thirty minutes, four days a week. Despite these efforts, she has experienced a weight gain of five pounds over the past month.  Her hyperlipidemia is managed with diet, exercise, weight loss, and Crestor  20 mg. Her LDL cholesterol is at 64 and her HDL cholesterol is at 63. Her triglycerides were previously 184 and are now 155. She requests a refill for Crestor .  She is being treated for vitamin D  deficiency with prescription oral calciferol 50,000 IU. Her last vitamin D  level was 46.6, nearing the target range in the fifties.  Her type 2 diabetes is managed with Farxiga  10 mg and Mounjaro  12.5 mg. Her most recent hemoglobin A1c was well controlled at 5.3. She occasionally forgets her morning metformin  due to the timing with her [employer] Thyroid  medication. Her recent lab results showed a glucose level of 66, without symptoms of hypoglycemia. Her insulin  level has decreased from  28 to 14.  She has a family history of heart disease, with several members on her father's side having undergone open heart surgeries.      PHYSICAL EXAM:  Blood pressure 135/81, pulse 82, temperature 98.3 F (36.8 C), height 5' 4 (1.626 m), weight 224 lb (101.6 kg), SpO2 99%. Body mass index is 38.45 kg/m.  DIAGNOSTIC DATA REVIEWED:  BMET    Component Value Date/Time   NA 140 06/01/2024 1257   K 4.2 06/01/2024 1257   CL 101 06/01/2024 1257   CO2 23 06/01/2024 1257   GLUCOSE 66 (L) 06/01/2024 1257   GLUCOSE 176 (H) 07/20/2022 1334   BUN 19 06/01/2024 1257   CREATININE 0.63 06/01/2024 1257   CREATININE 0.60 05/26/2019 1616   CALCIUM  9.7 06/01/2024 1257   GFRNONAA >60 08/29/2019 1150   GFRNONAA 108 05/26/2019 1616   GFRAA >60 08/29/2019 1150   GFRAA 125 05/26/2019 1616   Lab Results  Component Value Date   HGBA1C 5.3 06/01/2024   HGBA1C 5.6 07/24/2015   Lab Results  Component Value Date   INSULIN  14.9 06/01/2024   INSULIN  50.6 (H) 08/06/2022   Lab Results  Component Value Date   TSH 3.670 01/04/2024   CBC    Component Value Date/Time   WBC 8.5 06/01/2024 1257   WBC 7.4 01/26/2022 0914   RBC 4.73 06/01/2024 1257   RBC 4.38 01/26/2022 0914   HGB 14.9 06/01/2024 1257   HCT 44.9 06/01/2024 1257   PLT 322 06/01/2024 1257   MCV 95 06/01/2024 1257  MCH 31.5 06/01/2024 1257   MCH 31.1 08/29/2019 1150   MCHC 33.2 06/01/2024 1257   MCHC 33.9 01/26/2022 0914   RDW 12.8 06/01/2024 1257   Iron Studies    Component Value Date/Time   FERRITIN 51.1 06/15/2016 1218   Lipid Panel     Component Value Date/Time   CHOL 128 01/04/2024 0918   TRIG 120 01/04/2024 0918   HDL 44 01/04/2024 0918   CHOLHDL 3 01/26/2022 0914   VLDL 32.8 01/26/2022 0914   LDLCALC 62 01/04/2024 0918   LDLDIRECT 118.0 01/08/2021 1438   Hepatic Function Panel     Component Value Date/Time   PROT 7.5 06/01/2024 1257   ALBUMIN 4.6 06/01/2024 1257   AST 20 06/01/2024 1257   ALT 27  06/01/2024 1257   ALKPHOS 76 06/01/2024 1257   BILITOT 0.6 06/01/2024 1257   BILIDIR 0.10 02/06/2020 0947      Component Value Date/Time   TSH 3.670 01/04/2024 0918   Nutritional Lab Results  Component Value Date   VD25OH 46.6 06/01/2024   VD25OH 39.8 12/09/2023   VD25OH 54.3 05/13/2023     Assessment and Plan Assessment & Plan Morbid obesity with elevated BMI She has gained 5 pounds in the last month, attributed to recent travel and holiday indulgences. Approximately 2 pounds were water weight, 1 pound was muscle gain, and 2 pounds were actual adipose weight gain. She is following the category four eating plan about 60% of the time and struggles with protein intake. She is walking 30 minutes four days a week. She is motivated to maintain her weight loss and is aware of the importance of a structured plan to avoid further weight gain. - Continue category four eating plan - Encouraged structured eating habits - Continue walking 30 minutes four days a week  Type 2 diabetes mellitus, well controlled Her type 2 diabetes is well controlled with a recent hemoglobin A1c of 5.3. She is on Farxiga  10 mg and Mounjaro  12.5 mg. Her glucose level was slightly low at 66, but she is not on medications that typically cause hypoglycemia. Her insulin  level has improved from 28 to 14, indicating better regulation, though not yet optimal. She is advised to monitor for symptoms of hypoglycemia and check glucose levels if symptoms occur. - Continue Farxiga  10 mg - Continue Mounjaro  12.5 mg - Monitor for symptoms of hypoglycemia and check glucose levels if symptoms occur  Mixed hyperlipidemia with elevated lipoprotein(a) genetic cardiovascular risk Her LDL cholesterol is well controlled at 64, below the target of 70. Her HDL is at 63, and triglycerides have improved from 120 to 184. She has an elevated lipoprotein(a), indicating a higher genetic risk for heart disease. The importance of maintaining LDL  below 70 is emphasized due to her diabetes and genetic risk. She is advised to remain aggressive with LDL control. - Continue Crestor  20 mg - Maintain LDL below 70 - Provided handout on lipoprotein(a) and its implications  Essential hypertension Her blood pressure is well controlled at 135/81. She is on losartan  and reports occasional forgetfulness with medication adherence, particularly with metformin  due to timing with [employer] Thyroid . - Continue losartan  - Continue diet, exercise and weight loss as discussed today as an important part of the treatment plan   Vitamin D  deficiency, improving Her vitamin D  level is improving, with the last level at 46.6, close to the goal of 50. She is on prescription oral calciferol 50,000 IU. - Continue prescription oral calciferol 50,000 IU  Patients who are on anti-obesity medications are counseled on the importance of maintaining healthy lifestyle habits, including balanced nutrition, regular physical activity, and behavioral modifications,  Medication is an adjunct to, not a replacement for, lifestyle changes and that the long-term success and weight maintenance depend on continued adherence to these strategies.   Durga was informed of the importance of frequent follow up visits to maximize her success with intensive lifestyle modifications for her obesity and obesity related health conditions as recommended by USPSTF and CMS guidelines Louann Penton, MD

## 2024-07-21 ENCOUNTER — Encounter: Payer: Self-pay | Admitting: Internal Medicine

## 2024-08-07 ENCOUNTER — Encounter (INDEPENDENT_AMBULATORY_CARE_PROVIDER_SITE_OTHER): Payer: Self-pay | Admitting: Family Medicine

## 2024-08-07 ENCOUNTER — Ambulatory Visit (INDEPENDENT_AMBULATORY_CARE_PROVIDER_SITE_OTHER): Admitting: Family Medicine

## 2024-08-07 ENCOUNTER — Other Ambulatory Visit (HOSPITAL_COMMUNITY): Payer: Self-pay

## 2024-08-07 VITALS — Ht 64.0 in | Wt 224.0 lb

## 2024-08-07 DIAGNOSIS — Z6841 Body Mass Index (BMI) 40.0 and over, adult: Secondary | ICD-10-CM

## 2024-08-07 DIAGNOSIS — I10 Essential (primary) hypertension: Secondary | ICD-10-CM | POA: Diagnosis not present

## 2024-08-07 DIAGNOSIS — Z6838 Body mass index (BMI) 38.0-38.9, adult: Secondary | ICD-10-CM

## 2024-08-07 DIAGNOSIS — E669 Obesity, unspecified: Secondary | ICD-10-CM

## 2024-08-07 DIAGNOSIS — E119 Type 2 diabetes mellitus without complications: Secondary | ICD-10-CM | POA: Diagnosis not present

## 2024-08-07 DIAGNOSIS — Z7984 Long term (current) use of oral hypoglycemic drugs: Secondary | ICD-10-CM

## 2024-08-07 DIAGNOSIS — Z7985 Long-term (current) use of injectable non-insulin antidiabetic drugs: Secondary | ICD-10-CM

## 2024-08-07 DIAGNOSIS — E782 Mixed hyperlipidemia: Secondary | ICD-10-CM | POA: Diagnosis not present

## 2024-08-07 DIAGNOSIS — E559 Vitamin D deficiency, unspecified: Secondary | ICD-10-CM | POA: Diagnosis not present

## 2024-08-07 DIAGNOSIS — E118 Type 2 diabetes mellitus with unspecified complications: Secondary | ICD-10-CM

## 2024-08-07 MED ORDER — TIRZEPATIDE 15 MG/0.5ML ~~LOC~~ SOAJ
15.0000 mg | SUBCUTANEOUS | 0 refills | Status: AC
  Filled 2024-08-07: qty 6, 84d supply, fill #0

## 2024-08-07 MED ORDER — LOSARTAN POTASSIUM 25 MG PO TABS
12.5000 mg | ORAL_TABLET | Freq: Every day | ORAL | 0 refills | Status: AC
  Filled 2024-08-07: qty 90, 180d supply, fill #0

## 2024-08-07 MED ORDER — DAPAGLIFLOZIN PROPANEDIOL 10 MG PO TABS
10.0000 mg | ORAL_TABLET | Freq: Every day | ORAL | 0 refills | Status: AC
  Filled 2024-08-07 – 2024-09-08 (×2): qty 90, 90d supply, fill #0

## 2024-08-07 MED ORDER — VITAMIN D (ERGOCALCIFEROL) 1.25 MG (50000 UNIT) PO CAPS
50000.0000 [IU] | ORAL_CAPSULE | ORAL | 0 refills | Status: AC
  Filled 2024-08-07: qty 12, 84d supply, fill #0

## 2024-08-07 MED ORDER — ROSUVASTATIN CALCIUM 20 MG PO TABS
20.0000 mg | ORAL_TABLET | Freq: Every day | ORAL | 0 refills | Status: AC
  Filled 2024-08-07 – 2024-09-08 (×2): qty 90, 90d supply, fill #0

## 2024-08-07 MED ORDER — METFORMIN HCL 500 MG PO TABS
500.0000 mg | ORAL_TABLET | Freq: Two times a day (BID) | ORAL | 0 refills | Status: AC
  Filled 2024-08-07: qty 180, 90d supply, fill #0

## 2024-08-07 NOTE — Progress Notes (Signed)
 "  Office: [phone]  /  Fax: [phone]  WEIGHT SUMMARY AND BIOMETRICS  Anthropometric Measurements Height: 5' 4 (1.626 m) Weight: 224 lb (101.6 kg) BMI (Calculated): 38.43 Weight at Last Visit: 224 lb Weight Lost Since Last Visit: 0 Weight Gained Since Last Visit: 0 Starting Weight: 275 lb Total Weight Loss (lbs): 51 lb (23.1 kg) Peak Weight: 287 lb   Body Composition  Body Fat %: 46.9 % Fat Mass (lbs): 105.2 lbs Muscle Mass (lbs): 113 lbs Total Body Water (lbs): 83.8 lbs Visceral Fat Rating : 14   Other Clinical Data Fasting: yes Labs: no Today's Visit #: 25 Starting Date: 08/06/22    Chief Complaint: OBESITY    History of Present Illness [name] is a 54 year old female with obesity and type 2 diabetes who presents for obesity treatment and progress assessment.  She is adhering to the category four eating plan about fifty percent of the time and is struggling to consume adequate amounts of protein, although she is eating more fruits and vegetables. She generally gets seven to nine hours of sleep per night and is not skipping meals. She walks for exercise four days a week for twenty minutes and has maintained her weight over the last month despite the holidays.  She has type 2 diabetes and is currently on Farxiga  10 mg, metformin  500 mg twice a day, and Mounjaro  12.5 mg. Her most recent hemoglobin A1c in October of last year was 5.3. She requests refills for these medications.  She also has hypertension, which is being treated with losartan , along with diet, exercise, and weight loss.  She has hyperlipidemia, which is being managed with diet, exercise, weight loss, and Crestor  20 mg. She requests a refill for this medication.  She has a vitamin D  deficiency, treated with ergocalciferol  50,000 IU weekly. Her most recent vitamin D  level was 46.6 in October.      PHYSICAL EXAM:  Height 5' 4 (1.626 m), weight 224 lb (101.6 kg). Body mass index is  38.45 kg/m.  DIAGNOSTIC DATA REVIEWED:  BMET    Component Value Date/Time   NA 140 06/01/2024 1257   K 4.2 06/01/2024 1257   CL 101 06/01/2024 1257   CO2 23 06/01/2024 1257   GLUCOSE 66 (L) 06/01/2024 1257   GLUCOSE 176 (H) 07/20/2022 1334   BUN 19 06/01/2024 1257   CREATININE 0.63 06/01/2024 1257   CREATININE 0.60 05/26/2019 1616   CALCIUM  9.7 06/01/2024 1257   GFRNONAA >60 08/29/2019 1150   GFRNONAA 108 05/26/2019 1616   GFRAA >60 08/29/2019 1150   GFRAA 125 05/26/2019 1616   Lab Results  Component Value Date   HGBA1C 5.3 06/01/2024   HGBA1C 5.6 07/24/2015   Lab Results  Component Value Date   INSULIN  14.9 06/01/2024   INSULIN  50.6 (H) 08/06/2022   Lab Results  Component Value Date   TSH 3.670 01/04/2024   CBC    Component Value Date/Time   WBC 8.5 06/01/2024 1257   WBC 7.4 01/26/2022 0914   RBC 4.73 06/01/2024 1257   RBC 4.38 01/26/2022 0914   HGB 14.9 06/01/2024 1257   HCT 44.9 06/01/2024 1257   PLT 322 06/01/2024 1257   MCV 95 06/01/2024 1257   MCH 31.5 06/01/2024 1257   MCH 31.1 08/29/2019 1150   MCHC 33.2 06/01/2024 1257   MCHC 33.9 01/26/2022 0914   RDW 12.8 06/01/2024 1257   Iron Studies    Component Value Date/Time   FERRITIN 51.1 06/15/2016 1218  Lipid Panel     Component Value Date/Time   CHOL 128 01/04/2024 0918   TRIG 120 01/04/2024 0918   HDL 44 01/04/2024 0918   CHOLHDL 3 01/26/2022 0914   VLDL 32.8 01/26/2022 0914   LDLCALC 62 01/04/2024 0918   LDLDIRECT 118.0 01/08/2021 1438   Hepatic Function Panel     Component Value Date/Time   PROT 7.5 06/01/2024 1257   ALBUMIN 4.6 06/01/2024 1257   AST 20 06/01/2024 1257   ALT 27 06/01/2024 1257   ALKPHOS 76 06/01/2024 1257   BILITOT 0.6 06/01/2024 1257   BILIDIR 0.10 02/06/2020 0947      Component Value Date/Time   TSH 3.670 01/04/2024 0918   Nutritional Lab Results  Component Value Date   VD25OH 46.6 06/01/2024   VD25OH 39.8 12/09/2023   VD25OH 54.3 05/13/2023      Assessment and Plan Assessment & Plan Obesity She is following the category four eating plan about 50% of the time, struggles with adequate protein intake, and has increased fruit and vegetable consumption. She maintains weight over the holidays, sleeps 7-9 hours per night, and exercises by walking four days a week for 20 minutes. - Continue category four eating plan for breakfast, lunch, and snacks. - Implement meal planning and prepping with provided recipes and meal panning/grocery list - Increase Mounjaro  to 15 mg and provide a 90-day supply.  Type 2 diabetes mellitus Well-controlled with a recent hemoglobin A1c of 5.3. She is on Farxiga  10 mg, metformin  500 mg twice a day, and Mounjaro  12.5 mg. No gastrointestinal issues reported with the current Mounjaro  dose. - Increased Mounjaro  to 15 mg and provided a 90-day supply. - Continue Farxiga  10 mg and metformin  500 mg twice a day. - Continue diet, exercise and weight loss as discussed today as an important part of the treatment plan   Essential hypertension Well-controlled with a blood pressure of 121/76. She is on losartan  and is working on diet, exercise, and weight loss. - Continue losartan . - Continue diet, exercise, and weight loss efforts.  Mixed hyperlipidemia with elevated lipoprotein(a) She has mixed hyperlipidemia with elevated lipoprotein(a), a genetic condition indicating higher cardiovascular risk. Apolipoprotein B is at 74, which is good. She is on Crestor  20 mg and is working on diet, exercise, and weight loss. - Continue Crestor  20 mg, tight LDL control important. - Continue diet, exercise, and weight loss efforts. - Discuss lipoprotein(a) with cardiologist at next appointment.  Vitamin D  deficiency Managed with ergocalciferol  50,000 IU weekly. Recent vitamin D  level was 46.6, close to the goal of 50-60. - Continue ergocalciferol  50,000 IU weekly.      Patients who are on anti-obesity medications are  counseled on the importance of maintaining healthy lifestyle habits, including balanced nutrition, regular physical activity, and behavioral modifications,  Medication is an adjunct to, not a replacement for, lifestyle changes and that the long-term success and weight maintenance depend on continued adherence to these strategies.   Emberly was informed of the importance of frequent follow up visits to maximize her success with intensive lifestyle modifications for her obesity and obesity related health conditions as recommended by USPSTF and CMS guidelines  Louann Penton, MD   "

## 2024-08-08 ENCOUNTER — Other Ambulatory Visit (HOSPITAL_COMMUNITY): Payer: Self-pay

## 2024-08-08 ENCOUNTER — Other Ambulatory Visit: Payer: Self-pay

## 2024-08-08 DIAGNOSIS — E039 Hypothyroidism, unspecified: Secondary | ICD-10-CM

## 2024-08-08 MED ORDER — THYROID 30 MG PO TABS
90.0000 mg | ORAL_TABLET | Freq: Every day | ORAL | 0 refills | Status: AC
  Filled 2024-08-08: qty 76, 25d supply, fill #0
  Filled 2024-08-08: qty 194, 65d supply, fill #0

## 2024-08-09 ENCOUNTER — Other Ambulatory Visit: Payer: Self-pay

## 2024-08-09 ENCOUNTER — Other Ambulatory Visit (HOSPITAL_COMMUNITY): Payer: Self-pay

## 2024-08-22 ENCOUNTER — Ambulatory Visit (INDEPENDENT_AMBULATORY_CARE_PROVIDER_SITE_OTHER): Admitting: Family Medicine

## 2024-09-08 ENCOUNTER — Other Ambulatory Visit: Payer: Self-pay

## 2024-09-19 ENCOUNTER — Ambulatory Visit (INDEPENDENT_AMBULATORY_CARE_PROVIDER_SITE_OTHER): Admitting: Family Medicine

## 2024-10-17 ENCOUNTER — Ambulatory Visit (INDEPENDENT_AMBULATORY_CARE_PROVIDER_SITE_OTHER): Admitting: Family Medicine
